# Patient Record
Sex: Female | Born: 1950 | Race: White | Hispanic: No | Marital: Married | Smoking: Never smoker
Health system: Southern US, Community
[De-identification: ages and names within clinical notes are randomized; demographics above are authoritative.]

## PROBLEM LIST (undated history)

## (undated) DIAGNOSIS — R351 Nocturia: Secondary | ICD-10-CM

## (undated) DIAGNOSIS — T8859XA Other complications of anesthesia, initial encounter: Secondary | ICD-10-CM

## (undated) DIAGNOSIS — I10 Essential (primary) hypertension: Secondary | ICD-10-CM

## (undated) DIAGNOSIS — K219 Gastro-esophageal reflux disease without esophagitis: Secondary | ICD-10-CM

## (undated) DIAGNOSIS — T4145XA Adverse effect of unspecified anesthetic, initial encounter: Secondary | ICD-10-CM

## (undated) DIAGNOSIS — M1990 Unspecified osteoarthritis, unspecified site: Secondary | ICD-10-CM

## (undated) DIAGNOSIS — L409 Psoriasis, unspecified: Secondary | ICD-10-CM

## (undated) DIAGNOSIS — H269 Unspecified cataract: Secondary | ICD-10-CM

## (undated) DIAGNOSIS — R112 Nausea with vomiting, unspecified: Secondary | ICD-10-CM

## (undated) DIAGNOSIS — Z8679 Personal history of other diseases of the circulatory system: Secondary | ICD-10-CM

## (undated) DIAGNOSIS — Z8719 Personal history of other diseases of the digestive system: Secondary | ICD-10-CM

## (undated) DIAGNOSIS — M419 Scoliosis, unspecified: Secondary | ICD-10-CM

## (undated) DIAGNOSIS — M545 Low back pain, unspecified: Secondary | ICD-10-CM

## (undated) DIAGNOSIS — Z780 Asymptomatic menopausal state: Secondary | ICD-10-CM

## (undated) DIAGNOSIS — F329 Major depressive disorder, single episode, unspecified: Secondary | ICD-10-CM

## (undated) DIAGNOSIS — F32A Depression, unspecified: Secondary | ICD-10-CM

## (undated) DIAGNOSIS — Z9889 Other specified postprocedural states: Secondary | ICD-10-CM

## (undated) DIAGNOSIS — E785 Hyperlipidemia, unspecified: Secondary | ICD-10-CM

## (undated) HISTORY — DX: Essential (primary) hypertension: I10

## (undated) HISTORY — DX: Low back pain: M54.5

## (undated) HISTORY — DX: Hyperlipidemia, unspecified: E78.5

## (undated) HISTORY — DX: Unspecified osteoarthritis, unspecified site: M19.90

## (undated) HISTORY — DX: Low back pain, unspecified: M54.50

## (undated) HISTORY — DX: Depression, unspecified: F32.A

## (undated) HISTORY — PX: OTHER SURGICAL HISTORY: SHX169

## (undated) HISTORY — DX: Gastro-esophageal reflux disease without esophagitis: K21.9

## (undated) HISTORY — DX: Major depressive disorder, single episode, unspecified: F32.9

## (undated) HISTORY — PX: CARPAL TUNNEL RELEASE: SHX101

## (undated) HISTORY — DX: Scoliosis, unspecified: M41.9

## (undated) HISTORY — DX: Psoriasis, unspecified: L40.9

## (undated) HISTORY — PX: ANKLE SURGERY: SHX546

## (undated) HISTORY — DX: Asymptomatic menopausal state: Z78.0

---
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
## 1998-11-13 ENCOUNTER — Ambulatory Visit (HOSPITAL_COMMUNITY): Admission: RE | Admit: 1998-11-13 | Discharge: 1998-11-13 | Payer: Self-pay | Admitting: Internal Medicine

## 1998-11-13 ENCOUNTER — Encounter: Payer: Self-pay | Admitting: Internal Medicine

## 2000-06-29 ENCOUNTER — Encounter: Admission: RE | Admit: 2000-06-29 | Discharge: 2000-06-29 | Payer: Self-pay | Admitting: Obstetrics and Gynecology

## 2000-06-29 ENCOUNTER — Encounter: Payer: Self-pay | Admitting: Obstetrics and Gynecology

## 2001-04-27 ENCOUNTER — Encounter: Payer: Self-pay | Admitting: Dermatology

## 2001-04-27 ENCOUNTER — Ambulatory Visit (HOSPITAL_COMMUNITY): Admission: RE | Admit: 2001-04-27 | Discharge: 2001-04-27 | Payer: Self-pay | Admitting: Dermatology

## 2001-04-27 ENCOUNTER — Encounter (INDEPENDENT_AMBULATORY_CARE_PROVIDER_SITE_OTHER): Payer: Self-pay | Admitting: Specialist

## 2001-06-14 ENCOUNTER — Other Ambulatory Visit: Admission: RE | Admit: 2001-06-14 | Discharge: 2001-06-14 | Payer: Self-pay | Admitting: Obstetrics and Gynecology

## 2001-07-15 ENCOUNTER — Encounter: Admission: RE | Admit: 2001-07-15 | Discharge: 2001-07-15 | Payer: Self-pay | Admitting: Obstetrics and Gynecology

## 2001-07-15 ENCOUNTER — Encounter: Payer: Self-pay | Admitting: Obstetrics and Gynecology

## 2002-06-30 ENCOUNTER — Other Ambulatory Visit: Admission: RE | Admit: 2002-06-30 | Discharge: 2002-06-30 | Payer: Self-pay | Admitting: Obstetrics and Gynecology

## 2002-09-19 ENCOUNTER — Encounter: Payer: Self-pay | Admitting: Obstetrics and Gynecology

## 2002-09-19 ENCOUNTER — Encounter: Admission: RE | Admit: 2002-09-19 | Discharge: 2002-09-19 | Payer: Self-pay | Admitting: Obstetrics and Gynecology

## 2003-11-29 ENCOUNTER — Encounter: Admission: RE | Admit: 2003-11-29 | Discharge: 2003-11-29 | Payer: Self-pay | Admitting: Internal Medicine

## 2004-05-29 ENCOUNTER — Ambulatory Visit (HOSPITAL_COMMUNITY): Admission: RE | Admit: 2004-05-29 | Discharge: 2004-05-29 | Payer: Self-pay | Admitting: Dermatology

## 2004-05-29 ENCOUNTER — Encounter (INDEPENDENT_AMBULATORY_CARE_PROVIDER_SITE_OTHER): Payer: Self-pay | Admitting: Specialist

## 2005-02-06 ENCOUNTER — Ambulatory Visit (HOSPITAL_COMMUNITY): Admission: RE | Admit: 2005-02-06 | Discharge: 2005-02-06 | Payer: Self-pay | Admitting: Obstetrics and Gynecology

## 2005-03-17 ENCOUNTER — Other Ambulatory Visit: Admission: RE | Admit: 2005-03-17 | Discharge: 2005-03-17 | Payer: Self-pay | Admitting: Obstetrics and Gynecology

## 2006-04-13 ENCOUNTER — Ambulatory Visit (HOSPITAL_COMMUNITY): Admission: RE | Admit: 2006-04-13 | Discharge: 2006-04-13 | Payer: Self-pay | Admitting: Obstetrics and Gynecology

## 2006-06-10 ENCOUNTER — Ambulatory Visit (HOSPITAL_BASED_OUTPATIENT_CLINIC_OR_DEPARTMENT_OTHER): Admission: RE | Admit: 2006-06-10 | Discharge: 2006-06-10 | Payer: Self-pay | Admitting: Orthopedic Surgery

## 2006-07-20 ENCOUNTER — Other Ambulatory Visit: Admission: RE | Admit: 2006-07-20 | Discharge: 2006-07-20 | Payer: Self-pay | Admitting: Obstetrics and Gynecology

## 2007-04-27 ENCOUNTER — Ambulatory Visit (HOSPITAL_COMMUNITY): Admission: RE | Admit: 2007-04-27 | Discharge: 2007-04-27 | Payer: Self-pay | Admitting: Obstetrics and Gynecology

## 2007-05-04 ENCOUNTER — Encounter: Admission: RE | Admit: 2007-05-04 | Discharge: 2007-05-04 | Payer: Self-pay | Admitting: Obstetrics and Gynecology

## 2007-09-29 HISTORY — PX: DILATION AND CURETTAGE OF UTERUS: SHX78

## 2007-10-12 ENCOUNTER — Other Ambulatory Visit: Admission: RE | Admit: 2007-10-12 | Discharge: 2007-10-12 | Payer: Self-pay | Admitting: Obstetrics and Gynecology

## 2008-05-22 ENCOUNTER — Ambulatory Visit (HOSPITAL_COMMUNITY): Admission: RE | Admit: 2008-05-22 | Discharge: 2008-05-22 | Payer: Self-pay | Admitting: Obstetrics and Gynecology

## 2008-06-28 ENCOUNTER — Ambulatory Visit (HOSPITAL_BASED_OUTPATIENT_CLINIC_OR_DEPARTMENT_OTHER): Admission: RE | Admit: 2008-06-28 | Discharge: 2008-06-28 | Payer: Self-pay | Admitting: Orthopedic Surgery

## 2008-10-24 ENCOUNTER — Other Ambulatory Visit: Admission: RE | Admit: 2008-10-24 | Discharge: 2008-10-24 | Payer: Self-pay | Admitting: Obstetrics and Gynecology

## 2008-11-12 ENCOUNTER — Encounter: Payer: Self-pay | Admitting: Obstetrics and Gynecology

## 2008-11-12 ENCOUNTER — Ambulatory Visit (HOSPITAL_COMMUNITY): Admission: RE | Admit: 2008-11-12 | Discharge: 2008-11-12 | Payer: Self-pay | Admitting: Obstetrics and Gynecology

## 2009-02-05 ENCOUNTER — Encounter: Admission: RE | Admit: 2009-02-05 | Discharge: 2009-02-05 | Payer: Self-pay | Admitting: Internal Medicine

## 2009-06-10 ENCOUNTER — Ambulatory Visit (HOSPITAL_COMMUNITY): Admission: RE | Admit: 2009-06-10 | Discharge: 2009-06-10 | Payer: Self-pay | Admitting: Obstetrics and Gynecology

## 2009-08-08 ENCOUNTER — Ambulatory Visit (HOSPITAL_BASED_OUTPATIENT_CLINIC_OR_DEPARTMENT_OTHER): Admission: RE | Admit: 2009-08-08 | Discharge: 2009-08-08 | Payer: Self-pay | Admitting: Orthopedic Surgery

## 2010-03-20 ENCOUNTER — Ambulatory Visit (HOSPITAL_COMMUNITY): Admission: RE | Admit: 2010-03-20 | Discharge: 2010-03-20 | Payer: Self-pay | Admitting: Anesthesiology

## 2010-07-31 ENCOUNTER — Ambulatory Visit (HOSPITAL_COMMUNITY): Admission: RE | Admit: 2010-07-31 | Discharge: 2010-07-31 | Payer: Self-pay | Admitting: Obstetrics and Gynecology

## 2010-10-19 ENCOUNTER — Encounter: Payer: Self-pay | Admitting: Obstetrics and Gynecology

## 2010-10-20 ENCOUNTER — Encounter: Payer: Self-pay | Admitting: Internal Medicine

## 2010-12-30 ENCOUNTER — Ambulatory Visit (INDEPENDENT_AMBULATORY_CARE_PROVIDER_SITE_OTHER): Payer: 59 | Admitting: Internal Medicine

## 2010-12-30 DIAGNOSIS — L578 Other skin changes due to chronic exposure to nonionizing radiation: Secondary | ICD-10-CM

## 2010-12-30 DIAGNOSIS — I10 Essential (primary) hypertension: Secondary | ICD-10-CM

## 2010-12-30 DIAGNOSIS — E785 Hyperlipidemia, unspecified: Secondary | ICD-10-CM

## 2010-12-30 DIAGNOSIS — K210 Gastro-esophageal reflux disease with esophagitis: Secondary | ICD-10-CM

## 2010-12-31 LAB — BASIC METABOLIC PANEL
BUN: 9 mg/dL (ref 6–23)
Calcium: 9.4 mg/dL (ref 8.4–10.5)
Creatinine, Ser: 0.78 mg/dL (ref 0.4–1.2)
GFR calc Af Amer: >60 mL/min (ref >60)
GFR calc non Af Amer: >60 mL/min (ref >60)
Glucose, Bld: 89 mg/dL (ref 70–99)
Sodium: 137 mEq/L (ref 135–145)

## 2010-12-31 LAB — POCT HEMOGLOBIN-HEMACUE: Hemoglobin: 13.4 g/dL (ref 12.0–15.0)

## 2011-01-13 LAB — BASIC METABOLIC PANEL
Creatinine, Ser: 0.73 mg/dL (ref 0.4–1.2)
GFR calc Af Amer: >60 mL/min (ref >60)
Glucose, Bld: 105 mg/dL — ABNORMAL HIGH (ref 70–99)
Sodium: 135 mEq/L (ref 135–145)

## 2011-01-13 LAB — CBC
MCHC: 33.6 g/dL (ref 30.0–36.0)
RBC: 4.31 MIL/uL (ref 3.87–5.11)
RDW: 12.9 % (ref 11.5–15.5)

## 2011-02-03 ENCOUNTER — Encounter: Payer: Self-pay | Admitting: Internal Medicine

## 2011-02-10 NOTE — Op Note (Signed)
Pamela Malone, CRAGIN               ACCOUNT NO.:  [id number]   MEDICAL RECORD NO.:  [id number]          PATIENT TYPE:  AMB   LOCATION:  SDC                           FACILITY:  [facility]   PHYSICIAN:  Cynthia P. Romine, M.D.DATE OF BIRTH:  [birth year]   DATE OF PROCEDURE:  DATE OF DISCHARGE:                               OPERATIVE REPORT   PREOPERATIVE DIAGNOSES:  Postmenopausal bleeding, endometrial polyp seen  on sonohysterogram.   POSTOPERATIVE DIAGNOSES:  Postmenopausal bleeding, endometrial polyp  seen on sonohysterogram plus probable small submucous fibroid.   PROCEDURES:  Hysteroscopic resection of polyps and fibroids, D and C.   SURGEON:  Cynthia P. Romine, MD   ANESTHESIA:  General by LMA.   ESTIMATED BLOOD LOSS:  Minimal.  Sorbitol deficit, 20 mL.   COMPLICATIONS:  None.   PROCEDURE:  The patient was taken to the operating room and after  induction of adequate general anesthesia, was placed in dorsal lithotomy  position and prepped and draped in the usual fashion.  The bladder was  drained with a red rubber catheter.  Posterior weighted and anterior  Sims retractor were placed.  The cervix was grasped on its anterior lip  with a single-toothed tenaculum.  A paracervical block was instituted by  injecting 10 mL of 1% lidocaine at each of 3 and 9 o'clock.  The uterus  sounded to 8 cm.  The cervix was dilated to #31 Shawnie Pons.  The resectoscope  was then introduced.  Sorbitol was used as a distention medium.  The  sorbitol pressure pump was set at 70 mmHg.  Several endometrial polyps  were noted fundally, also there seemed to be a small submucous myoma.  These were resected using a single loop resectoscope removing them in  several pieces until the cavity appeared clear.  Photographic  documentation was taken both before beginning the resection and after.  The specimen that was felt to be a possible submucous fibroid was sent  separately.  The polyps and the curettings were sent  together to  pathology.  Once the cavity appeared clean, the scope was withdrawn.  Gentle curettage was done, then the infant removed from the vagina and  the procedure was terminated.  The patient tolerated it well, went in  satisfactory condition to post anesthesia recovery.      Cynthia P. Romine, M.D.  Electronically Signed     CPR/MEDQ  D:  11/12/2008  T:  11/13/2008  Job:  [id number]

## 2011-02-10 NOTE — Op Note (Signed)
Pamela Malone, Pamela Malone               ACCOUNT NO.:  [id number]   MEDICAL RECORD NO.:  [id number]          PATIENT TYPE:  AMB   LOCATION:  DSC                          FACILITY:  [facility]   PHYSICIAN:  Katy Fitch. Sypher, M.D. DATE OF BIRTH:  [birth year]   DATE OF PROCEDURE:  06/28/2008  DATE OF DISCHARGE:                               OPERATIVE REPORT   PREOPERATIVE DIAGNOSES:  1. Entrapped neuropathy, median nerve, left carpal tunnel.  2. Severe degenerative arthritis, left thumb carpometacarpal joint.   POSTOPERATIVE DIAGNOSES:  1. Entrapped neuropathy, median nerve, left carpal tunnel.  2. Severe degenerative arthritis, left thumb carpometacarpal joint.   OPERATION:  1. Release of left transcarpal ligament.  2. Injection of left thumb carpometacarpal joint with Depo-Medrol 40      mg and lidocaine 1 mL of 2% plain lidocaine.   OPERATING SURGEON:  Katy Fitch. Sypher, MD   ASSISTANT:  Marveen Reeks Dasnoit, PA-C   ANESTHESIA:  General by LMA.   SUPERVISING ANESTHESIOLOGIST:  Janetta Hora. Gelene Mink, MD   INDICATIONS:  Pamela Malone is a 60 year old woman who has been followed  for thumb arthritis and bilateral carpal tunnel syndrome.  She is status  post release of right transcarpal ligament.  We had discussed possible  injection of her left thumb CMC joint to ameliorate pain due to [place]  arthrosis.  She is brought to the operating room at this time  anticipating release of her left transcarpal ligament.  While under  anesthesia, we will also inject her left thumb CMC joint with Depo-  Medrol and lidocaine.   After informed consent, she was brought to the operating room at this  time.  Preoperatively, she was interviewed by Dr. Gelene Mink from  Anesthesia.  General anesthesia by LMA technique was recommended and  accepted by Pamela Malone.   PROCEDURE:  Janaia Malone was brought to the operating room and placed  in supine position upon the operating table.   Following the induction of  general anesthesia by LMA technique, the left  arm was prepped with Betadine soap solution, and sterilely draped.  A  pneumatic tourniquet was applied to the proximal brachium.   On exsanguination of the left arm with Esmarch bandage, the arterial  tourniquet was inflated to 220 mmHg.  The procedure commenced with  careful palpation of the landmarks of the [facility] joint.  A 10 pounds  traction was applied to the thumb.  The [facility] joint line was identified  and a 25-gauge needle gently placed within the capsule.  After  aspiration, the joint was infiltrated with a mixture of 1.5 mL of a  50:50 mixture of Depo-Medrol 40 mg per mL and 2% lidocaine.  Total dose  of Depo-Medrol was probably in the range of 30 mg.   Attention was then directed to the mid palm.  A short incision was  fashioned in the line of the ring finger.  Subcutaneous tissues were  carefully divided via the palmar fascia.  This split longitudinally to  the common sensory branch of the median nerve.  These were followed back  to the transcarpal ligament,  which was gently isolated from the median  nerve utilizing a Insurance risk surveyor.  The ulnar aspect of the  transverse carpal ligament was released with scissors extending across  the carpal canal into the distal forearm.  This widely opened the carpal  canal.  The ulnar bursa was noted to be fibrotic and hypertrophic.   The wound was then inspect to bleeding points and repaired with  intradermal 3-0 Prolene suture.  A compressive dressing was applied with  a volar plaster splint maintaining the wrist in 5 degrees of  dorsiflexion.   There were no apparent complications.  Ms. Brousseau tolerated the  surgery and anesthesia well.  She was transferred to the recovery room  with stable signs.      Katy Fitch Sypher, M.D.  Electronically Signed     RVS/MEDQ  D:  06/28/2008  T:  06/29/2008  Job:  [id number]

## 2011-02-13 NOTE — Op Note (Signed)
Pamela Malone, Pamela Malone               ACCOUNT NO.:  [id number]   MEDICAL RECORD NO.:  [id number]          PATIENT TYPE:  AMB   LOCATION:  DSC                          FACILITY:  [facility]   PHYSICIAN:  Katy Fitch. Sypher, M.D. DATE OF BIRTH:  10-18-1950   DATE OF PROCEDURE:  06/10/2006  DATE OF DISCHARGE:                                 OPERATIVE REPORT   PREOPERATIVE DIAGNOSES:  1. Bilateral carpal tunnel syndrome, right greater than left.  2. Left thumb carpometacarpal arthrosis.   POSTOPERATIVE DIAGNOSES:  1. Bilateral carpal tunnel syndrome, right greater than left.  2. Left thumb carpometacarpal arthrosis.   OPERATIONS:  1. Release of right transverse carpal ligament.  2. Injection of left ulnar bursa.  3. Injection of left thumb CMC joint under general anesthesia.   OPERATING SURGEON:  Katy Fitch. Sypher, M.D.   ASSISTANT:  Molly Maduro Dasnoit PA-C.   ANESTHESIA:  General by LMA.   SUPERVISING ANESTHESIOLOGIST:  Dr. Jairo Ben.   INDICATIONS:  Pamela Malone is a 60 year old  woman who was referred for  evaluation bilateral hand pain and numbness.  Clinical examination revealed  signs of significant left thumb CMC arthrosis and bilateral carpal tunnel  syndrome.   On May 12, 2006, Dr. Wadie Lessen performed detailed electrodiagnostic  studies revealing a nonrecordable motor latency on the right and moderately  severe left carpal tunnel syndrome.  Pamela Malone was noted to have  significant carpometacarpal arthritis both on clinical examination and  radiographic films.  At the time of her informed consent, anticipating right  carpal tunnel release surgery, we offered injection of her left ulnar bursa  and left thumb CMC joint.  She preferred to have her injection performed  while under general anesthesia.   After informed consent, she is brought to the operating room at this time.   PROCEDURE:  Deshayla Empson is brought to the operating room and placed in the  supine  position upon the operating table.   Following the induction of general anesthesia by LMA technique, the right  arm was prepped Betadine soap solution and sterilely draped.  A pneumatic  tourniquet was applied to the proximal right brachium.  On exsanguination of  the right arm with an Esmarch bandage, the arterial tourniquet was inflated  to 220 mmHg.  The procedure commenced with a short incision in the line of  the ring finger and the palm.  Subcutaneous tissues were carefully divided  and revealed the palmar fascia.  This was split longitudinally through the  common sensory branch of the median nerve and the superficial palmar arch.  The distal margin of the transverse carpal ligament was defined and the  Penfield 4 elevator used to define a plane between the median nerve proper  and the deep surface of the transverse carpal ligament.   Scissor were used along the ulnar border of the ligament to release the  ligament subcutaneously extending into the distal forearm.  The volar carpal  ligament was identified and also released.   The wound was inspected for bleeding points.  Wound was subsequently  repaired with intradermal 3-0 Prolene  and Steri-Strips.   Attention was then directed to the left hand.   With the fingers in flexed position, I injected a mixture of 1.5 mL of 1%  plain lidocaine and 0.5 mL of Depo-Medrol 40 mg/mL into the ulnar bursa.  At  time of injection, we extended the fingers to avoid any possible needle  entry into the flexor tendons.   The thumb CMC joint was likewise injected with mixture of 0.5 mL of Depo-  Medrol 40 mg/mL and 0.5 mL of 2% plain lidocaine.  The landmarks were  palpated and after alcohol and Betadine prep, the CMC joint was placed under  distraction and injected with the mixture for a total of 1 mL of the 50/50  mixture.   There were no apparent complications.   Pamela Malone tolerated surgery and anesthesia well.  Her right hand was   addressed with a sterile gauze, sterile Webril, a volar plaster splint and  Ace wrap.  A Band-Aid was applied to the left hand at the injection site.      Katy Fitch Sypher, M.D.  Electronically Signed     RVS/MEDQ  D:  06/10/2006  T:  06/11/2006  Job:  [id number]

## 2011-06-02 ENCOUNTER — Ambulatory Visit: Payer: 59 | Admitting: Internal Medicine

## 2011-06-08 ENCOUNTER — Ambulatory Visit: Payer: 59 | Admitting: Internal Medicine

## 2011-06-15 ENCOUNTER — Ambulatory Visit (INDEPENDENT_AMBULATORY_CARE_PROVIDER_SITE_OTHER): Payer: 59 | Admitting: Internal Medicine

## 2011-06-15 ENCOUNTER — Encounter: Payer: Self-pay | Admitting: Internal Medicine

## 2011-06-15 VITALS — BP 120/78 | HR 75 | Temp 97.2°F | Ht 60.0 in | Wt 176.0 lb

## 2011-06-15 DIAGNOSIS — I10 Essential (primary) hypertension: Secondary | ICD-10-CM | POA: Insufficient documentation

## 2011-06-15 DIAGNOSIS — M1990 Unspecified osteoarthritis, unspecified site: Secondary | ICD-10-CM

## 2011-06-15 DIAGNOSIS — F329 Major depressive disorder, single episode, unspecified: Secondary | ICD-10-CM | POA: Insufficient documentation

## 2011-06-15 DIAGNOSIS — M419 Scoliosis, unspecified: Secondary | ICD-10-CM | POA: Insufficient documentation

## 2011-06-15 DIAGNOSIS — K219 Gastro-esophageal reflux disease without esophagitis: Secondary | ICD-10-CM

## 2011-06-15 DIAGNOSIS — F32A Depression, unspecified: Secondary | ICD-10-CM | POA: Insufficient documentation

## 2011-06-15 DIAGNOSIS — M545 Low back pain: Secondary | ICD-10-CM | POA: Insufficient documentation

## 2011-06-15 DIAGNOSIS — Z780 Asymptomatic menopausal state: Secondary | ICD-10-CM | POA: Insufficient documentation

## 2011-06-15 DIAGNOSIS — E785 Hyperlipidemia, unspecified: Secondary | ICD-10-CM

## 2011-06-15 DIAGNOSIS — L409 Psoriasis, unspecified: Secondary | ICD-10-CM | POA: Insufficient documentation

## 2011-06-15 NOTE — Patient Instructions (Signed)
Schedule CPE in 6 months  OK to come off Welchol for now  Recheck fasting lipids at CPe

## 2011-06-15 NOTE — Progress Notes (Signed)
Subjective:    Patient ID: Pamela Malone, female    DOB: [birth year], 60 y.o.   MRN: [id number]  HPI  Pamela Malone is doing well.  Happy feeling good.  Enjoys spending time with grandsons  BP has been great.  Wants to know if she can come off Welchol.  Was put on this by PA at [facility]  See last lipid in paper chart  Meloxicam helping DJD esp with back.  No GI distress       No Known Allergies Past Medical History  Diagnosis Date  . Scoliosis   . Depression   . Hypertension   . Hyperlipidemia   . DJD (degenerative joint disease)   . Menopause   . Psoriasis   . GERD (gastroesophageal reflux disease)   . Low back pain    Past Surgical History  Procedure Date  . Carpal tunnel release bilateral  . Dilation and curettage of uterus 2009   History   Social History  . Marital Status: Married    Spouse Name: N/A    Number of Children: 2  . Years of Education: HS grad   Occupational History  . Retired    Social History Main Topics  . Smoking status: Never Smoker   . Smokeless tobacco: Never Used  . Alcohol Use: 2.0 oz/week    4 drink(s) per week  . Drug Use: No  . Sexually Active: Yes   Other Topics Concern  . Not on file   Social History Narrative  . No narrative on file   Family History  Problem Relation Age of Onset  . Hypertension Mother   . Stroke Mother   . Diabetes Paternal Grandmother   . Alzheimer's disease Mother   . Parkinsonism Paternal Grandmother    There is no problem list on file for this patient.  Current Outpatient Prescriptions on File Prior to Visit  Medication Sig Dispense Refill  . cetirizine (ZYRTEC) 10 MG tablet Take 10 mg by mouth daily.        . citalopram (CELEXA) 40 MG tablet Take 40 mg by mouth daily.        . colesevelam (WELCHOL) 625 MG tablet Take 3,750 mg by mouth daily.        Marland Kitchen omeprazole (PRILOSEC) 20 MG capsule Take 20 mg by mouth daily.        . simvastatin (ZOCOR) 20 MG tablet Take 20 mg by mouth at bedtime.            Review of Systems No chest pain no SOB, No LE edema NO change in appetiite or change in color of stool    Objective:   Physical Exam  Physical Exam  Nursing note and vitals reviewed.  Constitutional: She is oriented to person, place, and time. She appears well-developed and well-nourished.  HENT:  Head: Normocephalic and atraumatic.  Cardiovascular: Normal rate and regular rhythm. Exam reveals no gallop and no friction rub.  No murmur heard.  Pulmonary/Chest: Breath sounds normal. She has no wheezes. She has no rales.  Neurological: She is alert and oriented to person, place, and time.  Skin: Skin is warm and dry.  Psychiatric: She has a normal mood and affect. Her behavior is normal. LE no edema            Assessment & Plan:  1)  Hyperlipidemai  OK to come off Welchol for now.  Will Check TG's in 6 months at CPE 2)  HTn  Well controlled 3)  Low back pain and DJD.  Managed well on Meloxicam  Take Omeprazole for GI protection.   Cautioned regardinfg GI dyspe[sia or stomach bleeding .Marland Kitchen  She is to stop med and report any symptoms 4)  GERD on omeprazole

## 2011-06-29 ENCOUNTER — Other Ambulatory Visit: Payer: Self-pay | Admitting: Internal Medicine

## 2011-06-29 DIAGNOSIS — Z1231 Encounter for screening mammogram for malignant neoplasm of breast: Secondary | ICD-10-CM

## 2011-06-29 LAB — BASIC METABOLIC PANEL
BUN: 14
Calcium: 9.4
Chloride: 100
Creatinine, Ser: 0.82
GFR calc non Af Amer: >60
Potassium: 3.8

## 2011-06-29 LAB — POCT HEMOGLOBIN-HEMACUE: Hemoglobin: 15.2 — ABNORMAL HIGH

## 2011-08-03 ENCOUNTER — Ambulatory Visit (HOSPITAL_COMMUNITY)
Admission: RE | Admit: 2011-08-03 | Discharge: 2011-08-03 | Disposition: A | Discharge status: Home / Self Care | Payer: 59 | Source: Ambulatory Visit | Attending: Internal Medicine | Admitting: Internal Medicine

## 2011-08-03 DIAGNOSIS — Z1231 Encounter for screening mammogram for malignant neoplasm of breast: Secondary | ICD-10-CM | POA: Insufficient documentation

## 2011-08-24 ENCOUNTER — Other Ambulatory Visit: Payer: Self-pay | Admitting: Internal Medicine

## 2011-08-24 DIAGNOSIS — K219 Gastro-esophageal reflux disease without esophagitis: Secondary | ICD-10-CM

## 2011-08-24 MED ORDER — OMEPRAZOLE 20 MG PO CPDR: 20.0000 mg | DELAYED_RELEASE_CAPSULE | Freq: Every day | ORAL | Status: DC

## 2011-08-24 NOTE — Telephone Encounter (Signed)
Spoke with Pamela Malone, she requests 90 days to [facility] order (not [facility] [facility])

## 2011-08-24 NOTE — Telephone Encounter (Signed)
Pt's husband called in a prescription refill for his wife. It's Omeprazole (Capsule Delayed Release) PRILOSEC 20 MG Take 20 mg by mouth daily.  He states they get tee medicine at  [facility]. If you have any questions please call [phone].

## 2011-08-28 ENCOUNTER — Other Ambulatory Visit: Payer: Self-pay | Admitting: Emergency Medicine

## 2011-08-28 DIAGNOSIS — E785 Hyperlipidemia, unspecified: Secondary | ICD-10-CM

## 2011-08-28 NOTE — Telephone Encounter (Signed)
Refill request received from [facility] for Pamela Malone's simvastatin 90 day supply

## 2011-08-31 MED ORDER — SIMVASTATIN 20 MG PO TABS: 20.0000 mg | ORAL_TABLET | Freq: Every day | ORAL | Status: DC

## 2011-09-23 ENCOUNTER — Other Ambulatory Visit: Payer: Self-pay | Admitting: Internal Medicine

## 2011-09-23 MED ORDER — CITALOPRAM HYDROBROMIDE 40 MG PO TABS: 40.0000 mg | ORAL_TABLET | Freq: Every day | ORAL | Status: DC

## 2011-09-23 NOTE — Telephone Encounter (Signed)
Pt called needing a refill on Citalopram Hydrobromide (Tab) CELEXA 40 MG Take 40 mg by mouth daily.  [facility] is her pharmacy and they need a renewal on the medication. She usually gets a 3 month supply. If any questions feel free to call her at [phone]. Thanks

## 2011-09-24 ENCOUNTER — Ambulatory Visit (INDEPENDENT_AMBULATORY_CARE_PROVIDER_SITE_OTHER): Payer: 59 | Admitting: Internal Medicine

## 2011-09-24 ENCOUNTER — Telehealth: Payer: Self-pay | Admitting: Internal Medicine

## 2011-09-24 VITALS — BP 137/86 | HR 80 | Temp 97.2°F | Resp 16 | Ht 60.0 in | Wt 180.1 lb

## 2011-09-24 DIAGNOSIS — R05 Cough: Secondary | ICD-10-CM

## 2011-09-24 DIAGNOSIS — J40 Bronchitis, not specified as acute or chronic: Secondary | ICD-10-CM

## 2011-09-24 MED ORDER — AZITHROMYCIN 250 MG PO TABS: ORAL_TABLET | ORAL | Status: AC

## 2011-09-24 MED ORDER — HYDROCODONE-HOMATROPINE 5-1.5 MG/5ML PO SYRP: 5.0000 mL | ORAL_SOLUTION | Freq: Three times a day (TID) | ORAL | Status: AC | PRN

## 2011-09-24 NOTE — Telephone Encounter (Signed)
Spoke with Pamela Malone.  She states that she has had a cough for about 7 days.  It is non productive, but it is "rattly" in her chest.  She states she has some sinus pressure, but no cold like symptoms.  No fevers.  She is concerned for infectious process, requests zpak.  Aware DDS will not call in abx without being seen.  Scheduled appt this afternoon at 4pm

## 2011-09-24 NOTE — Telephone Encounter (Signed)
PT WOULD LIKE TO BE SEEN TODAY IF POSSIBLE OR SOMETHING CALL IN FOR HER CRAZY COUGH. PLEASE CALL HER AT [phone]. THANKS

## 2011-09-25 ENCOUNTER — Encounter: Payer: Self-pay | Admitting: Internal Medicine

## 2011-09-25 NOTE — Progress Notes (Signed)
Subjective:    Patient ID: Pamela Malone, female    DOB: 08/20/1951, 60 y.o.   MRN: [id number]  HPI  Khylah is here for an acute visit.  She has had 7 days of URI symtpoms   Nasal congestion, cough occasionally productive and sinus pressure.  She has no fever but did not receive and influenza vaccine.  She is concerned she has a sinus infection.  No SOB or chest pain No Known Allergies Past Medical History  Diagnosis Date  . Scoliosis   . Menopause   . Psoriasis   . Low back pain   . DJD (degenerative joint disease)   . Depression   . GERD (gastroesophageal reflux disease)   . Hyperlipidemia   . Hypertension    Past Surgical History  Procedure Date  . Carpal tunnel release bilateral  . Dilation and curettage of uterus 2009   History   Social History  . Marital Status: Married    Spouse Name: N/A    Number of Children: 2  . Years of Education: HS grad   Occupational History  . Retired    Social History Main Topics  . Smoking status: Never Smoker   . Smokeless tobacco: Never Used  . Alcohol Use: 2.0 oz/week    4 drink(s) per week  . Drug Use: No  . Sexually Active: Yes   Other Topics Concern  . Not on file   Social History Narrative  . No narrative on file   Family History  Problem Relation Age of Onset  . Hypertension Mother   . Stroke Mother   . Diabetes Paternal Grandmother   . Alzheimer's disease Mother   . Parkinsonism Paternal Grandmother    Patient Active Problem List  Diagnoses  . DJD (degenerative joint disease)  . Scoliosis  . Low back pain  . Depression  . GERD (gastroesophageal reflux disease)  . Hyperlipidemia  . Hypertension  . Menopause  . Psoriasis   Current Outpatient Prescriptions on File Prior to Visit  Medication Sig Dispense Refill  . cetirizine (ZYRTEC) 10 MG tablet Take 10 mg by mouth daily.        . citalopram (CELEXA) 40 MG tablet Take 1 tablet (40 mg total) by mouth daily.  90 tablet  1  . colesevelam (WELCHOL) 625 MG  tablet Take 3,750 mg by mouth daily.        . meloxicam (MOBIC) 15 MG tablet Take 1 tablet by mouth Daily.      . Olmesartan-Amlodipine-HCTZ (TRIBENZOR) 40-5-25 MG TABS Take 1 tablet by mouth daily.        Marland Kitchen omeprazole (PRILOSEC) 20 MG capsule Take 1 capsule (20 mg total) by mouth daily.  90 capsule  1  . simvastatin (ZOCOR) 20 MG tablet Take 1 tablet (20 mg total) by mouth at bedtime.  90 tablet  1       Review of Systems See HPI    Objective:   Physical Exam No Known Allergies Past Medical History  Diagnosis Date  . Scoliosis   . Menopause   . Psoriasis   . Low back pain   . DJD (degenerative joint disease)   . Depression   . GERD (gastroesophageal reflux disease)   . Hyperlipidemia   . Hypertension    Past Surgical History  Procedure Date  . Carpal tunnel release bilateral  . Dilation and curettage of uterus 2009   History   Social History  . Marital Status: Married    Spouse  Name: N/A    Number of Children: 2  . Years of Education: HS grad   Occupational History  . Retired    Social History Main Topics  . Smoking status: Never Smoker   . Smokeless tobacco: Never Used  . Alcohol Use: 2.0 oz/week    4 drink(s) per week  . Drug Use: No  . Sexually Active: Yes   Other Topics Concern  . Not on file   Social History Narrative  . No narrative on file   Family History  Problem Relation Age of Onset  . Hypertension Mother   . Stroke Mother   . Diabetes Paternal Grandmother   . Alzheimer's disease Mother   . Parkinsonism Paternal Grandmother    Patient Active Problem List  Diagnoses  . DJD (degenerative joint disease)  . Scoliosis  . Low back pain  . Depression  . GERD (gastroesophageal reflux disease)  . Hyperlipidemia  . Hypertension  . Menopause  . Psoriasis   Current Outpatient Prescriptions on File Prior to Visit  Medication Sig Dispense Refill  . cetirizine (ZYRTEC) 10 MG tablet Take 10 mg by mouth daily.        . citalopram (CELEXA) 40  MG tablet Take 1 tablet (40 mg total) by mouth daily.  90 tablet  1  . colesevelam (WELCHOL) 625 MG tablet Take 3,750 mg by mouth daily.        . meloxicam (MOBIC) 15 MG tablet Take 1 tablet by mouth Daily.      . Olmesartan-Amlodipine-HCTZ (TRIBENZOR) 40-5-25 MG TABS Take 1 tablet by mouth daily.        Marland Kitchen omeprazole (PRILOSEC) 20 MG capsule Take 1 capsule (20 mg total) by mouth daily.  90 capsule  1  . simvastatin (ZOCOR) 20 MG tablet Take 1 tablet (20 mg total) by mouth at bedtime.  90 tablet  1   Physical Exam  Constitutional: She is oriented to person, place, and time. She appears well-developed and well-nourished. She is cooperative.  HENT:  Head: Normocephalic and atraumatic.  Right Ear: A middle ear effusion is present.  Left Ear: A middle ear effusion is present.  Nose: Mucosal edema present. Right sinus exhibits maxillary sinus tenderness. Left sinus exhibits maxillary sinus tenderness.  Mouth/Throat: Posterior oropharyngeal erythema present.  Serous effusion bilaterally  Eyes: Conjunctivae and EOM are normal. Pupils are equal, round, and reactive to light.  Neck: Neck supple. Carotid bruit is not present. No mass present.  Cardiovascular: Regular rhythm, normal heart sounds, intact distal pulses and normal pulses. Exam reveals no gallop and no friction rub.  No murmur heard.  Pulmonary/Chest: Breath sounds normal. She has no wheezes. She has no rhonchi. She has no rales.  Neurological: She is alert and oriented to person, place, and time.  Skin: Skin is warm and dry. No abrasion, no bruising, no ecchymosis and no rash noted. No cyanosis. Nails show no clubbing.  Psychiatric: She has a normal mood and affect. Her speech is normal and behavior is normal.             Assessment & Plan:  1)  Bronchitis Z pak 2) cough  Hydorocodone cough syrup

## 2011-09-25 NOTE — Patient Instructions (Signed)
Follow up prn

## 2011-09-30 ENCOUNTER — Other Ambulatory Visit: Payer: Self-pay | Admitting: Internal Medicine

## 2011-09-30 DIAGNOSIS — I10 Essential (primary) hypertension: Secondary | ICD-10-CM

## 2011-09-30 MED ORDER — OLMESARTAN-AMLODIPINE-HCTZ 40-5-25 MG PO TABS: 1.0000 | ORAL_TABLET | Freq: Every day | ORAL | Status: DC

## 2011-09-30 NOTE — Telephone Encounter (Signed)
Pt called stating that she need her Olmesartan-Amlodipine-HCTZ (Tab) Olmesartan-Amlodipine-HCTZ 40-5-25 MG Take 1 tablet by mouth daily.  Filled; [facility] is her pharmacy; it's a mail order; and she needs a three month supply;Thanks

## 2011-12-03 ENCOUNTER — Encounter: Payer: Self-pay | Admitting: Internal Medicine

## 2011-12-03 ENCOUNTER — Ambulatory Visit (INDEPENDENT_AMBULATORY_CARE_PROVIDER_SITE_OTHER): Payer: 59 | Admitting: Internal Medicine

## 2011-12-03 VITALS — BP 139/85 | HR 97 | Temp 97.1°F | Ht 60.0 in | Wt 178.0 lb

## 2011-12-03 DIAGNOSIS — R05 Cough: Secondary | ICD-10-CM

## 2011-12-03 DIAGNOSIS — J019 Acute sinusitis, unspecified: Secondary | ICD-10-CM

## 2011-12-03 MED ORDER — AZITHROMYCIN 250 MG PO TABS: ORAL_TABLET | ORAL | Status: AC

## 2011-12-03 MED ORDER — HYDROCOD POLST-CHLORPHEN POLST 10-8 MG/5ML PO LQCR: 5.0000 mL | Freq: Two times a day (BID) | ORAL | Status: DC

## 2011-12-03 NOTE — Patient Instructions (Signed)
Take meds as directed cal l if not better

## 2011-12-03 NOTE — Progress Notes (Signed)
Subjective:    Patient ID: Pamela Malone, female    DOB: [birth year], 61 y.o.   MRN: [id number]  HPI  Pamela Malone is here with acute visit  Several days fo nasal congestion, dry cough  Nasal discharge.  No fever , chest pain, or SOB  No Known Allergies Past Medical History  Diagnosis Date  . Scoliosis   . Menopause   . Psoriasis   . Low back pain   . DJD (degenerative joint disease)   . Depression   . GERD (gastroesophageal reflux disease)   . Hyperlipidemia   . Hypertension    Past Surgical History  Procedure Date  . Carpal tunnel release bilateral  . Dilation and curettage of uterus 2009   History   Social History  . Marital Status: Married    Spouse Name: N/A    Number of Children: 2  . Years of Education: HS grad   Occupational History  . Retired    Social History Main Topics  . Smoking status: Never Smoker   . Smokeless tobacco: Never Used  . Alcohol Use: 2.0 oz/week    4 drink(s) per week  . Drug Use: No  . Sexually Active: Yes   Other Topics Concern  . Not on file   Social History Narrative  . No narrative on file   Family History  Problem Relation Age of Onset  . Hypertension Mother   . Stroke Mother   . Diabetes Paternal Grandmother   . Alzheimer's disease Mother   . Parkinsonism Paternal Grandmother    Patient Active Problem List  Diagnoses  . DJD (degenerative joint disease)  . Scoliosis  . Low back pain  . Depression  . GERD (gastroesophageal reflux disease)  . Hyperlipidemia  . Hypertension  . Menopause  . Psoriasis   Current Outpatient Prescriptions on File Prior to Visit  Medication Sig Dispense Refill  . cetirizine (ZYRTEC) 10 MG tablet Take 10 mg by mouth daily.        . citalopram (CELEXA) 40 MG tablet Take 1 tablet (40 mg total) by mouth daily.  90 tablet  1  . colesevelam (WELCHOL) 625 MG tablet Take 3,750 mg by mouth daily.        . meloxicam (MOBIC) 15 MG tablet Take 1 tablet by mouth Daily.      . Olmesartan-Amlodipine-HCTZ  (TRIBENZOR) 40-5-25 MG TABS Take 1 tablet by mouth daily.  90 tablet  1  . omeprazole (PRILOSEC) 20 MG capsule Take 1 capsule (20 mg total) by mouth daily.  90 capsule  1  . simvastatin (ZOCOR) 20 MG tablet Take 1 tablet (20 mg total) by mouth at bedtime.  90 tablet  1      Review of Systems See hpi    Objective:   Physical Exam  Physical Exam  Constitutional: She is oriented to person, place, and time. She appears well-developed and well-nourished. She is cooperative.  HENT:  Head: Normocephalic and atraumatic.  Right Ear: A middle ear effusion is present.  Left Ear: A middle ear effusion is present.  Nose: Mucosal edema present. Right sinus exhibits maxillary sinus tenderness. Left sinus exhibits maxillary sinus tenderness.  Mouth/Throat: Posterior oropharyngeal erythema present.  Serous effusion bilaterally  Eyes: Conjunctivae and EOM are normal. Pupils are equal, round, and reactive to light.  Neck: Neck supple. Carotid bruit is not present. No mass present.  Cardiovascular: Regular rhythm, normal heart sounds, intact distal pulses and normal pulses. Exam reveals no gallop and no friction  rub.  No murmur heard.  Pulmonary/Chest: Breath sounds normal. She has no wheezes. She has no rhonchi. She has no rales.  Neurological: She is alert and oriented to person, place, and time.  Skin: Skin is warm and dry. No abrasion, no bruising, no ecchymosis and no rash noted. No cyanosis. Nails show no clubbing.  Psychiatric: She has a normal mood and affect. Her speech is normal and behavior is normal.             Assessment & Plan:  10   Sinusitis 20 cough  Z pak Tussionex Afrin for 4 days

## 2011-12-14 ENCOUNTER — Ambulatory Visit (INDEPENDENT_AMBULATORY_CARE_PROVIDER_SITE_OTHER): Payer: 59 | Admitting: Internal Medicine

## 2011-12-14 ENCOUNTER — Encounter: Payer: Self-pay | Admitting: Internal Medicine

## 2011-12-14 VITALS — BP 136/84 | HR 90 | Temp 97.7°F | Resp 20 | Ht 60.0 in | Wt 177.0 lb

## 2011-12-14 DIAGNOSIS — Z23 Encounter for immunization: Secondary | ICD-10-CM

## 2011-12-14 DIAGNOSIS — Z1151 Encounter for screening for human papillomavirus (HPV): Secondary | ICD-10-CM

## 2011-12-14 DIAGNOSIS — E785 Hyperlipidemia, unspecified: Secondary | ICD-10-CM

## 2011-12-14 DIAGNOSIS — Z780 Asymptomatic menopausal state: Secondary | ICD-10-CM

## 2011-12-14 DIAGNOSIS — Z139 Encounter for screening, unspecified: Secondary | ICD-10-CM

## 2011-12-14 DIAGNOSIS — Z01419 Encounter for gynecological examination (general) (routine) without abnormal findings: Secondary | ICD-10-CM

## 2011-12-14 DIAGNOSIS — Z124 Encounter for screening for malignant neoplasm of cervix: Secondary | ICD-10-CM

## 2011-12-14 DIAGNOSIS — N951 Menopausal and female climacteric states: Secondary | ICD-10-CM

## 2011-12-14 DIAGNOSIS — I10 Essential (primary) hypertension: Secondary | ICD-10-CM

## 2011-12-14 DIAGNOSIS — M1990 Unspecified osteoarthritis, unspecified site: Secondary | ICD-10-CM

## 2011-12-14 LAB — POCT URINALYSIS DIPSTICK
Blood, UA: NEGATIVE
Glucose, UA: NEGATIVE
Nitrite, UA: NEGATIVE
Protein, UA: NEGATIVE
Urobilinogen, UA: NEGATIVE

## 2011-12-14 NOTE — Patient Instructions (Signed)
Labs will be mailed to you  See me as needed 

## 2011-12-14 NOTE — Progress Notes (Signed)
Subjective:    Patient ID: Pamela Malone, female    DOB: [birth year], 61 y.o.   MRN: [id number]  HPI  Pamela Malone is here for comprhensive eval.  Overall doing well. Taking Zyrtec for allergies.    She consistently declines colonoscopies despite my counsel of improtance of obtaining one.  Sh is UTD on mammogram.  Not sure of Tetanus.     Review of Systems  Constitutional: Negative.   HENT: Negative.   Respiratory: Negative.   Cardiovascular: Negative.   Gastrointestinal: Negative.   Musculoskeletal: Positive for joint swelling and arthralgias.  Neurological: Negative.        Objective:   Physical Exam Physical Exam  Vital signs and nursing note reviewed  Constitutional: She is oriented to person, place, and time. She appears well-developed and well-nourished. She is cooperative.  HENT:  Head: Normocephalic and atraumatic.  Right Ear: Tympanic membrane normal.  Left Ear: Tympanic membrane normal.  Nose: Nose normal.  Mouth/Throat: Oropharynx is clear and moist and mucous membranes are normal. No oropharyngeal exudate or posterior oropharyngeal erythema.  Eyes: Conjunctivae and EOM are normal. Pupils are equal, round, and reactive to light.  Neck: Neck supple. No JVD present. Carotid bruit is not present. No mass and no thyromegaly present.  Cardiovascular: Regular rhythm, normal heart sounds, intact distal pulses and normal pulses.  Exam reveals no gallop and no friction rub.   No murmur heard. Pulses:      Dorsalis pedis pulses are 2+ on the right side, and 2+ on the left side.  Pulmonary/Chest: Breath sounds normal. She has no wheezes. She has no rhonchi. She has no rales. Right breast exhibits no mass, no nipple discharge and no skin change. Left breast exhibits no mass, no nipple discharge and no skin change.  Abdominal: Soft. Bowel sounds are normal. She exhibits no distension and no mass. There is no hepatosplenomegaly. There is no tenderness. There is no CVA tenderness.    Genitourinary: Rectum normal, vagina normal and uterus normal. Rectal exam shows no mass. Guaiac negative stool. No labial fusion. There is no lesion on the right labia. There is no lesion on the left labia. Cervix exhibits no motion tenderness. Right adnexum displays no mass, no tenderness and no fullness. Left adnexum displays no mass, no tenderness and no fullness. No erythema around the vagina.  Musculoskeletal:       No active synovitis to any joint.    Lymphadenopathy:       Right cervical: No superficial cervical adenopathy present.      Left cervical: No superficial cervical adenopathy present.       Right axillary: No pectoral and no lateral adenopathy present.       Left axillary: No pectoral and no lateral adenopathy present.      Right: No inguinal adenopathy present.       Left: No inguinal adenopathy present.  Neurological: She is alert and oriented to person, place, and time. She has normal strength and normal reflexes. No cranial nerve deficit or sensory deficit. She displays a negative Romberg sign. Coordination and gait normal.  Skin: Skin is warm and dry. No abrasion, no bruising, no ecchymosis and no rash noted. No cyanosis. Nails show no clubbing.  Psychiatric: She has a normal mood and affect. Her speech is normal and behavior is normal.          Assessment & Plan:  1)  Healt maintenance  See scanned sheet .  Tdap today 2)  Allergic rhintitis 3)  HTN  Well controlled 4)  Hyperlipidemia  Check today 5)  History of skin Ca  She has dermatolgist ^)DJD       Assessment & Plan:

## 2011-12-15 ENCOUNTER — Telehealth: Payer: Self-pay | Admitting: Internal Medicine

## 2011-12-15 LAB — CBC WITH DIFFERENTIAL/PLATELET
Basophils Absolute: 0 10*3/uL (ref 0.0–0.1)
Basophils Relative: 0 % (ref 0–1)
Eosinophils Absolute: 0.1 10*3/uL (ref 0.0–0.7)
Eosinophils Relative: 1 % (ref 0–5)
Lymphs Abs: 2.3 10*3/uL (ref 0.7–4.0)
MCH: 30.5 pg (ref 26.0–34.0)
MCHC: 32.7 g/dL (ref 30.0–36.0)
Neutrophils Relative %: 60 % (ref 43–77)
Platelets: 389 10*3/uL (ref 150–400)
RBC: 4.62 MIL/uL (ref 3.87–5.11)
RDW: 12.4 % (ref 11.5–15.5)

## 2011-12-15 LAB — COMPREHENSIVE METABOLIC PANEL
ALT: 19 U/L (ref 0–35)
AST: 22 U/L (ref 0–37)
Alkaline Phosphatase: 58 U/L (ref 39–117)
Creat: 0.84 mg/dL (ref 0.50–1.10)
Sodium: 136 mEq/L (ref 135–145)
Total Bilirubin: 0.6 mg/dL (ref 0.3–1.2)
Total Protein: 6.7 g/dL (ref 6.0–8.3)

## 2011-12-15 LAB — VITAMIN D 25 HYDROXY (VIT D DEFICIENCY, FRACTURES): Vit D, 25-Hydroxy: 27 ng/mL — ABNORMAL LOW (ref 30–89)

## 2011-12-15 LAB — LIPID PANEL
LDL Cholesterol: 129 mg/dL — ABNORMAL HIGH (ref 0–99)
Total CHOL/HDL Ratio: 2.8 Ratio
VLDL: 20 mg/dL (ref 0–40)

## 2011-12-15 NOTE — Telephone Encounter (Signed)
Pt needs to ask a question about an orthopedic test. Please call  [phone]

## 2011-12-15 NOTE — Telephone Encounter (Signed)
Spoke with Pamela Malone, she states that she saw Dr. Doristine Section at [facility] today. 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  She states that he wanted her to have a vitamin D3 and calcium level done.  She wanted to know if we could do those labs for her.  I explained she had both drawn yesterday.  Will forward results to Dr. Renae Fickle as soon as DDS reviews them

## 2011-12-21 NOTE — Telephone Encounter (Signed)
Labs faxed to Dr. Ollen Gross office

## 2012-01-06 ENCOUNTER — Telehealth: Payer: Self-pay | Admitting: Internal Medicine

## 2012-01-06 DIAGNOSIS — B977 Papillomavirus as the cause of diseases classified elsewhere: Secondary | ICD-10-CM

## 2012-01-06 NOTE — Telephone Encounter (Signed)
Spoke with both pt and Dr. Eustaquio Boyden.  With normal pap no need to treat HPV at this point but counseled pt of the necessity of having yearly pap smears.    Patient  voices understanding.  Pathology report mailed to pt

## 2012-01-07 ENCOUNTER — Telehealth: Payer: Self-pay | Admitting: Emergency Medicine

## 2012-01-07 NOTE — Telephone Encounter (Signed)
Mailed pap results to pt's home address per DDS

## 2012-02-22 ENCOUNTER — Other Ambulatory Visit: Payer: Self-pay | Admitting: Internal Medicine

## 2012-02-23 ENCOUNTER — Encounter: Payer: Self-pay | Admitting: Internal Medicine

## 2012-02-23 ENCOUNTER — Telehealth: Payer: Self-pay | Admitting: Internal Medicine

## 2012-02-23 ENCOUNTER — Ambulatory Visit (INDEPENDENT_AMBULATORY_CARE_PROVIDER_SITE_OTHER): Payer: 59 | Admitting: Internal Medicine

## 2012-02-23 VITALS — BP 144/96 | HR 88 | Temp 96.7°F | Resp 17 | Ht 60.0 in | Wt 183.0 lb

## 2012-02-23 DIAGNOSIS — R071 Chest pain on breathing: Secondary | ICD-10-CM

## 2012-02-23 DIAGNOSIS — R0789 Other chest pain: Secondary | ICD-10-CM

## 2012-02-23 DIAGNOSIS — B029 Zoster without complications: Secondary | ICD-10-CM | POA: Insufficient documentation

## 2012-02-23 DIAGNOSIS — E785 Hyperlipidemia, unspecified: Secondary | ICD-10-CM

## 2012-02-23 MED ORDER — VALACYCLOVIR HCL 1 G PO TABS: 1000.0000 mg | ORAL_TABLET | Freq: Two times a day (BID) | ORAL | Status: DC

## 2012-02-23 MED ORDER — SIMVASTATIN 20 MG PO TABS: 20.0000 mg | ORAL_TABLET | Freq: Every day | ORAL | Status: DC

## 2012-02-23 NOTE — Telephone Encounter (Signed)
Pt needs a refill: Simvastatin (Tab) ZOCOR 20 MG Take 1 tablet (20 mg total) by mouth at bedtime.  Please send or call [facility] 90 day supply. If you have any questions please call [phone]... Thanks

## 2012-02-23 NOTE — Progress Notes (Signed)
Subjective:    Patient ID: Pamela Malone, female    DOB: November 12, 1950, 61 y.o.   MRN: [id number]  HPI  Painful red rash started a few days ago R side of chest wall.  Itchy  Now no weeping.  Has had shilngles in th past.  No fever  She has delayed getting shingles vaccine despite my advice to do so at her [facility]  No Known Allergies Past Medical History  Diagnosis Date  . Scoliosis   . Menopause   . Psoriasis   . Low back pain   . DJD (degenerative joint disease)   . Depression   . GERD (gastroesophageal reflux disease)   . Hyperlipidemia   . Hypertension    Past Surgical History  Procedure Date  . Carpal tunnel release bilateral  . Dilation and curettage of uterus 2009   History   Social History  . Marital Status: Married    Spouse Name: N/A    Number of Children: 2  . Years of Education: HS grad   Occupational History  . Retired    Social History Main Topics  . Smoking status: Never Smoker   . Smokeless tobacco: Never Used  . Alcohol Use: 2.0 oz/week    4 drink(s) per week  . Drug Use: No  . Sexually Active: Yes   Other Topics Concern  . Not on file   Social History Narrative  . No narrative on file   Family History  Problem Relation Age of Onset  . Hypertension Mother   . Stroke Mother   . Diabetes Paternal Grandmother   . Alzheimer's disease Mother   . Parkinsonism Paternal Grandmother    Patient Active Problem List  Diagnoses  . DJD (degenerative joint disease)  . Scoliosis  . Low back pain  . Depression  . GERD (gastroesophageal reflux disease)  . Hyperlipidemia  . Hypertension  . Menopause  . Psoriasis  . HPV in female  . Shingles   Current Outpatient Prescriptions on File Prior to Visit  Medication Sig Dispense Refill  . cetirizine (ZYRTEC) 10 MG tablet Take 10 mg by mouth daily.        . citalopram (CELEXA) 40 MG tablet Take 1 tablet (40 mg total) by mouth daily.  90 tablet  1  . meloxicam (MOBIC) 15 MG tablet Take 1 tablet by mouth  Daily.      . Olmesartan-Amlodipine-HCTZ (TRIBENZOR) 40-5-25 MG TABS Take 1 tablet by mouth daily.  90 tablet  1  . omeprazole (PRILOSEC) 20 MG capsule Take 1 capsule (20 mg total) by mouth daily.  90 capsule  1  . PROTOPIC 0.1 % ointment Apply 1 application topically 2 (two) times daily as needed.      . simvastatin (ZOCOR) 20 MG tablet Take 1 tablet (20 mg total) by mouth at bedtime.  90 tablet  1  . DISCONTD: colesevelam (WELCHOL) 625 MG tablet Take 3,750 mg by mouth daily.            Review of Systems    see HPI Objective:   Physical Exam  Physical Exam  Nursing note and vitals reviewed.  Constitutional: She is oriented to person, place, and time. She appears well-developed and well-nourished.  HENT:  Head: Normocephalic and atraumatic.  Cardiovascular: Normal rate and regular rhythm. Exam reveals no gallop and no friction rub.  No murmur heard.  Pulmonary/Chest: Breath sounds normal. She has no wheezes. She has no rales.  Neurological: She is alert and oriented to  person, place, and time.  Skin: Skin is warm and dry.  Red rash with grouped vesicles  Dermatomal pattern R side of thorax.  No drainage Psychiatric: She has a normal mood and affect. Her behavior is normal.        Assessment & Plan:  1)  Shingles  Ok for Valtrex 1 gm po bid for 10 days  Advised shingles vaccine in 8-12 months

## 2012-02-23 NOTE — Patient Instructions (Signed)
Take medcine as prescribed   See me if not improved

## 2012-02-24 ENCOUNTER — Other Ambulatory Visit: Payer: Self-pay | Admitting: Internal Medicine

## 2012-03-22 ENCOUNTER — Other Ambulatory Visit: Payer: Self-pay | Admitting: *Deleted

## 2012-03-22 DIAGNOSIS — I10 Essential (primary) hypertension: Secondary | ICD-10-CM

## 2012-03-22 MED ORDER — OLMESARTAN-AMLODIPINE-HCTZ 40-5-25 MG PO TABS: 1.0000 | ORAL_TABLET | Freq: Every day | ORAL | Status: DC

## 2012-07-22 ENCOUNTER — Other Ambulatory Visit: Payer: Self-pay | Admitting: Internal Medicine

## 2012-07-22 DIAGNOSIS — Z1231 Encounter for screening mammogram for malignant neoplasm of breast: Secondary | ICD-10-CM

## 2012-07-29 ENCOUNTER — Other Ambulatory Visit: Payer: Self-pay | Admitting: Internal Medicine

## 2012-08-01 ENCOUNTER — Telehealth: Payer: Self-pay | Admitting: Internal Medicine

## 2012-08-01 NOTE — Telephone Encounter (Signed)
Informed pt that rx was sent in on Friday and to check with pharmacy today

## 2012-08-01 NOTE — Telephone Encounter (Signed)
Pt states she needs her refill of Omeprazole (Capsule Delayed Release) PRILOSEC 20 MG TAKE 1 CAPSULE DAILY She states [facility] sent in a request and they have not recd a response back.Marland KitchenMarland Kitchen

## 2012-08-11 ENCOUNTER — Ambulatory Visit (HOSPITAL_COMMUNITY)
Admission: RE | Admit: 2012-08-11 | Discharge: 2012-08-11 | Disposition: A | Discharge status: Home / Self Care | Payer: 59 | Source: Ambulatory Visit | Attending: Internal Medicine | Admitting: Internal Medicine

## 2012-08-11 DIAGNOSIS — Z1231 Encounter for screening mammogram for malignant neoplasm of breast: Secondary | ICD-10-CM | POA: Insufficient documentation

## 2012-08-15 ENCOUNTER — Other Ambulatory Visit: Payer: Self-pay | Admitting: Internal Medicine

## 2012-08-15 NOTE — Telephone Encounter (Signed)
See Pamela Malone's note pt would like a 90 day supply

## 2012-08-15 NOTE — Telephone Encounter (Signed)
Pt states she needs her Omeprazole (Capsule Delayed Release) PRILOSEC 20 MG TAKE 1 CAPSULE DAILY Sent to Mail order to [facility] as a 90 day supply... She said it was sent as an one month supply and was charge 84 dollars.. They need that resent back... Per pt is it usually a three month supply.. Please cll at [phone]

## 2012-08-16 MED ORDER — OMEPRAZOLE 20 MG PO CPDR: 20.0000 mg | DELAYED_RELEASE_CAPSULE | Freq: Every day | ORAL | Status: DC

## 2012-08-29 ENCOUNTER — Other Ambulatory Visit: Payer: Self-pay | Admitting: *Deleted

## 2012-08-29 DIAGNOSIS — E785 Hyperlipidemia, unspecified: Secondary | ICD-10-CM

## 2012-08-29 DIAGNOSIS — I10 Essential (primary) hypertension: Secondary | ICD-10-CM

## 2012-08-29 MED ORDER — OMEPRAZOLE 20 MG PO CPDR: 20.0000 mg | DELAYED_RELEASE_CAPSULE | Freq: Every day | ORAL | Status: DC

## 2012-08-29 MED ORDER — SIMVASTATIN 20 MG PO TABS: 20.0000 mg | ORAL_TABLET | Freq: Every day | ORAL | Status: DC

## 2012-08-29 MED ORDER — OLMESARTAN-AMLODIPINE-HCTZ 40-5-25 MG PO TABS: 1.0000 | ORAL_TABLET | Freq: Every day | ORAL | Status: DC

## 2012-08-29 NOTE — Telephone Encounter (Signed)
Bobbie   Call pharmacy to clarify [facility] for 90 days worth  Thanks

## 2012-08-29 NOTE — Telephone Encounter (Addendum)
Can you please order 90 days instead of 30 for prilosec and refill request for the other two. Pt husband called upset that this is the second time it has been sent for a 30 day supply

## 2012-08-29 NOTE — Addendum Note (Signed)
Addended by: Raechel Chute D on: 08/29/2012 06:01 PM   Modules accepted: Orders

## 2012-08-30 ENCOUNTER — Telehealth: Payer: Self-pay | Admitting: *Deleted

## 2012-08-30 NOTE — Telephone Encounter (Signed)
Called [facility] to confirm that Prilosec is for a 90 day supply spoke with Lance Bosch who states that she sees where rx were sent but it takes 24 to 48 hours for full processing

## 2012-10-04 ENCOUNTER — Telehealth: Payer: Self-pay | Admitting: *Deleted

## 2012-10-13 NOTE — Telephone Encounter (Signed)
appt made

## 2012-11-14 ENCOUNTER — Other Ambulatory Visit: Payer: Self-pay | Admitting: Internal Medicine

## 2012-11-14 DIAGNOSIS — I10 Essential (primary) hypertension: Secondary | ICD-10-CM

## 2012-11-14 DIAGNOSIS — E785 Hyperlipidemia, unspecified: Secondary | ICD-10-CM

## 2012-11-14 MED ORDER — SIMVASTATIN 20 MG PO TABS: 20.0000 mg | ORAL_TABLET | Freq: Every day | ORAL | Status: DC

## 2012-11-14 MED ORDER — OLMESARTAN-AMLODIPINE-HCTZ 40-5-25 MG PO TABS: 1.0000 | ORAL_TABLET | Freq: Every day | ORAL | Status: DC

## 2012-11-14 NOTE — Telephone Encounter (Signed)
Refill request

## 2012-11-14 NOTE — Telephone Encounter (Signed)
Pt states she needs a call back to go over medication.. She states there are medications that needs to be refill but she need to make sure they will be prescribe.. Please call pt at  [phone]

## 2012-12-12 ENCOUNTER — Encounter: Payer: Self-pay | Admitting: Internal Medicine

## 2012-12-12 ENCOUNTER — Ambulatory Visit (INDEPENDENT_AMBULATORY_CARE_PROVIDER_SITE_OTHER): Payer: 59 | Admitting: Internal Medicine

## 2012-12-12 VITALS — BP 138/86 | HR 84 | Temp 96.7°F | Resp 18 | Ht 61.0 in | Wt 189.0 lb

## 2012-12-12 DIAGNOSIS — I10 Essential (primary) hypertension: Secondary | ICD-10-CM

## 2012-12-12 DIAGNOSIS — R0981 Nasal congestion: Secondary | ICD-10-CM

## 2012-12-12 DIAGNOSIS — J3489 Other specified disorders of nose and nasal sinuses: Secondary | ICD-10-CM

## 2012-12-12 DIAGNOSIS — J329 Chronic sinusitis, unspecified: Secondary | ICD-10-CM

## 2012-12-12 MED ORDER — AZITHROMYCIN 250 MG PO TABS: ORAL_TABLET | ORAL | Status: DC

## 2012-12-12 NOTE — Progress Notes (Signed)
Subjective:    Patient ID: Pamela Malone, female    DOB: October 01, 1950, 62 y.o.   MRN: [id number]  HPI Pamela Malone is here for acute visit.  2 weeks of stuffy nose now sinus pain both maxillary sinuses   No fever no sore throat no cough  "I have been using way too much afrin nasal spray"  No Known Allergies Past Medical History  Diagnosis Date  . Scoliosis   . Menopause   . Psoriasis   . Low back pain   . DJD (degenerative joint disease)   . Depression   . GERD (gastroesophageal reflux disease)   . Hyperlipidemia   . Hypertension    Past Surgical History  Procedure Laterality Date  . Carpal tunnel release  bilateral  . Dilation and curettage of uterus  2009   History   Social History  . Marital Status: Married    Spouse Name: N/A    Number of Children: 2  . Years of Education: HS grad   Occupational History  . Retired    Social History Main Topics  . Smoking status: Never Smoker   . Smokeless tobacco: Never Used  . Alcohol Use: 2.0 oz/week    4 drink(s) per week  . Drug Use: No  . Sexually Active: Yes   Other Topics Concern  . Not on file   Social History Narrative  . No narrative on file   Family History  Problem Relation Age of Onset  . Hypertension Mother   . Stroke Mother   . Diabetes Paternal Grandmother   . Alzheimer's disease Mother   . Parkinsonism Paternal Grandmother    Patient Active Problem List  Diagnosis  . DJD (degenerative joint disease)  . Scoliosis  . Low back pain  . Depression  . GERD (gastroesophageal reflux disease)  . Hyperlipidemia  . Hypertension  . Menopause  . Psoriasis  . HPV in female  . Shingles   Current Outpatient Prescriptions on File Prior to Visit  Medication Sig Dispense Refill  . cetirizine (ZYRTEC) 10 MG tablet Take 10 mg by mouth daily.        . meloxicam (MOBIC) 15 MG tablet Take 7.5 mg by mouth 2 (two) times daily.       . Olmesartan-Amlodipine-HCTZ (TRIBENZOR) 40-5-25 MG TABS Take 1 tablet by mouth  daily.  90 tablet  1  . omeprazole (PRILOSEC) 20 MG capsule Take 1 capsule (20 mg total) by mouth daily.  90 capsule  1  . PROTOPIC 0.1 % ointment Apply 1 application topically 2 (two) times daily as needed.      . simvastatin (ZOCOR) 20 MG tablet Take 1 tablet (20 mg total) by mouth at bedtime.  90 tablet  1  . citalopram (CELEXA) 40 MG tablet Take 1 tablet (40 mg total) by mouth daily.  90 tablet  1  . [DISCONTINUED] colesevelam (WELCHOL) 625 MG tablet Take 3,750 mg by mouth daily.         No current facility-administered medications on file prior to visit.      Review of Systems    see HPI Objective:   Physical Exam . Physical Exam  Constitutional: She is oriented to person, place, and time. She appears well-developed and well-nourished. She is cooperative.  HENT:  Head: Normocephalic and atraumatic.  Right Ear: A middle ear effusion is present.  Left Ear: A middle ear effusion is present.  Nose: Mucosal edema present. Right sinus exhibits maxillary sinus tenderness. Left sinus exhibits maxillary  sinus tenderness.  Mouth/Throat: Posterior oropharyngeal erythema present.  Serous effusion bilaterally  Eyes: Conjunctivae and EOM are normal. Pupils are equal, round, and reactive to light.  Neck: Neck supple. Carotid bruit is not present. No mass present.  Cardiovascular: Regular rhythm, normal heart sounds, intact distal pulses and normal pulses. Exam reveals no gallop and no friction rub.  No murmur heard.  Pulmonary/Chest: Breath sounds normal. She has no wheezes. She has no rhonchi. She has no rales.  Neurological: She is alert and oriented to person, place, and time.  Skin: Skin is warm and dry. No abrasion, no bruising, no ecchymosis and no rash noted. No cyanosis. Nails show no clubbing.  Psychiatric: She has a normal mood and affect. Her speech is normal and behavior is normal.            Assessment & Plan:  Sinusitis   Will give Z pack  Nasal stuffiness  Advised   To stop using Afrin.  BP today my be elevated due to Afrin overuse  HTN  See above

## 2012-12-12 NOTE — Patient Instructions (Addendum)
See me if not better 

## 2012-12-13 ENCOUNTER — Encounter: Payer: 59 | Admitting: Internal Medicine

## 2012-12-22 ENCOUNTER — Encounter: Payer: Self-pay | Admitting: Internal Medicine

## 2012-12-22 ENCOUNTER — Telehealth: Payer: Self-pay | Admitting: Internal Medicine

## 2012-12-22 ENCOUNTER — Ambulatory Visit (INDEPENDENT_AMBULATORY_CARE_PROVIDER_SITE_OTHER): Payer: 59 | Admitting: Internal Medicine

## 2012-12-22 VITALS — BP 136/86 | HR 81 | Temp 97.2°F | Resp 20 | Wt 181.0 lb

## 2012-12-22 DIAGNOSIS — T485X5A Adverse effect of other anti-common-cold drugs, initial encounter: Secondary | ICD-10-CM | POA: Insufficient documentation

## 2012-12-22 DIAGNOSIS — H65499 Other chronic nonsuppurative otitis media, unspecified ear: Secondary | ICD-10-CM

## 2012-12-22 DIAGNOSIS — J329 Chronic sinusitis, unspecified: Secondary | ICD-10-CM

## 2012-12-22 DIAGNOSIS — J310 Chronic rhinitis: Secondary | ICD-10-CM

## 2012-12-22 MED ORDER — CEFTRIAXONE SODIUM 1 G IJ SOLR
500.0000 mg | Freq: Once | INTRAMUSCULAR | Status: AC
  Administered 2012-12-22: 500 mg via INTRAMUSCULAR

## 2012-12-22 MED ORDER — PREDNISONE 20 MG PO TABS: ORAL_TABLET | ORAL | Status: DC

## 2012-12-22 MED ORDER — FLUTICASONE PROPIONATE 50 MCG/ACT NA SUSP: 2.0000 | Freq: Every day | NASAL | Status: DC

## 2012-12-22 NOTE — Addendum Note (Signed)
Addended by: Mathews Robinsons on: 12/22/2012 04:42 PM   Modules accepted: Orders

## 2012-12-22 NOTE — Progress Notes (Signed)
Subjective:    Patient ID: Pamela Malone, female    DOB: 03-05-1951, 62 y.o.   MRN: [id number]  HPI Pamela Malone is here with continued nasal congestion.  She reports she is using OTC Afrin nasal spray for the past several weeks  "I can't stand to have a stuffy nose"   No fever no sore throat  Cough improved.   No Known Allergies Past Medical History  Diagnosis Date  . Scoliosis   . Menopause   . Psoriasis   . Low back pain   . DJD (degenerative joint disease)   . Depression   . GERD (gastroesophageal reflux disease)   . Hyperlipidemia   . Hypertension    Past Surgical History  Procedure Laterality Date  . Carpal tunnel release  bilateral  . Dilation and curettage of uterus  2009   History   Social History  . Marital Status: Married    Spouse Name: N/A    Number of Children: 2  . Years of Education: HS grad   Occupational History  . Retired    Social History Main Topics  . Smoking status: Never Smoker   . Smokeless tobacco: Never Used  . Alcohol Use: 2.0 oz/week    4 drink(s) per week  . Drug Use: No  . Sexually Active: Yes   Other Topics Concern  . Not on file   Social History Narrative  . No narrative on file   Family History  Problem Relation Age of Onset  . Hypertension Mother   . Stroke Mother   . Diabetes Paternal Grandmother   . Alzheimer's disease Mother   . Parkinsonism Paternal Grandmother    Patient Active Problem List  Diagnosis  . DJD (degenerative joint disease)  . Scoliosis  . Low back pain  . Depression  . GERD (gastroesophageal reflux disease)  . Hyperlipidemia  . Hypertension  . Menopause  . Psoriasis  . HPV in female  . Shingles  . Rhinitis medicamentosa   Current Outpatient Prescriptions on File Prior to Visit  Medication Sig Dispense Refill  . cetirizine (ZYRTEC) 10 MG tablet Take 10 mg by mouth daily.        . meloxicam (MOBIC) 15 MG tablet Take 7.5 mg by mouth 2 (two) times daily.       . Olmesartan-Amlodipine-HCTZ  (TRIBENZOR) 40-5-25 MG TABS Take 1 tablet by mouth daily.  90 tablet  1  . omeprazole (PRILOSEC) 20 MG capsule Take 1 capsule (20 mg total) by mouth daily.  90 capsule  1  . simvastatin (ZOCOR) 20 MG tablet Take 1 tablet (20 mg total) by mouth at bedtime.  90 tablet  1  . citalopram (CELEXA) 40 MG tablet Take 1 tablet (40 mg total) by mouth daily.  90 tablet  1  . PROTOPIC 0.1 % ointment Apply 1 application topically 2 (two) times daily as needed.      . [DISCONTINUED] colesevelam (WELCHOL) 625 MG tablet Take 3,750 mg by mouth daily.         No current facility-administered medications on file prior to visit.       Review of Systems See HPI    Objective:   Physical Exam  Physical Exam  Constitutional: She is oriented to person, place, and time. She appears well-developed and well-nourished. She is cooperative.  HENT:  Head: Normocephalic and atraumatic.  Right Ear: A middle ear effusion is present.  Left Ear: A middle ear effusion is present.  Nose: Mucosal edema present. Right  sinus exhibits maxillary sinus tenderness. Left sinus exhibits maxillary sinus tenderness.   Serous effusion bilaterally  Eyes: Conjunctivae and EOM are normal. Pupils are equal, round, and reactive to light.  Neck: Neck supple. Carotid bruit is not present. No mass present.  Cardiovascular: Regular rhythm, normal heart sounds, intact distal pulses and normal pulses. Exam reveals no gallop and no friction rub.  No murmur heard.  Pulmonary/Chest: Breath sounds normal. She has no wheezes. She has no rhonchi. She has no rales.  Neurological: She is alert and oriented to person, place, and time.  Skin: Skin is warm and dry. No abrasion, no bruising, no ecchymosis and no rash noted. No cyanosis. Nails show no clubbing.  Psychiatric: She has a normal mood and affect. Her speech is normal and behavior is normal.            Assessment & Plan:  Nasal congestion/rhinitis medicamatosa :  I clinically suspect   Afrin overuse.  Will give prednisone taper and flonase nasal spray for one bottle.  Advised to stop use of Afrin nasal spray  Ok to give Rocephin 500 mg in office

## 2012-12-22 NOTE — Telephone Encounter (Signed)
Pt continues to have sinusitis sx advised pt to come into office today

## 2012-12-22 NOTE — Telephone Encounter (Signed)
Pt states she still feels bad from a week ago, from the sinus infection and would like something called into [facility] [address] IN [place]...  PLS  CALL PT [phone]

## 2012-12-22 NOTE — Patient Instructions (Addendum)
See me if needed 

## 2013-01-02 ENCOUNTER — Encounter: Payer: Self-pay | Admitting: Internal Medicine

## 2013-01-02 ENCOUNTER — Ambulatory Visit (INDEPENDENT_AMBULATORY_CARE_PROVIDER_SITE_OTHER): Payer: 59 | Admitting: Internal Medicine

## 2013-01-02 VITALS — BP 128/76 | HR 86 | Temp 97.8°F | Resp 18 | Ht 61.0 in | Wt 189.0 lb

## 2013-01-02 DIAGNOSIS — M4120 Other idiopathic scoliosis, site unspecified: Secondary | ICD-10-CM

## 2013-01-02 DIAGNOSIS — Z780 Asymptomatic menopausal state: Secondary | ICD-10-CM

## 2013-01-02 DIAGNOSIS — M419 Scoliosis, unspecified: Secondary | ICD-10-CM

## 2013-01-02 DIAGNOSIS — Z0000 Encounter for general adult medical examination without abnormal findings: Secondary | ICD-10-CM

## 2013-01-02 DIAGNOSIS — J310 Chronic rhinitis: Secondary | ICD-10-CM

## 2013-01-02 DIAGNOSIS — B977 Papillomavirus as the cause of diseases classified elsewhere: Secondary | ICD-10-CM

## 2013-01-02 DIAGNOSIS — E785 Hyperlipidemia, unspecified: Secondary | ICD-10-CM

## 2013-01-02 DIAGNOSIS — I10 Essential (primary) hypertension: Secondary | ICD-10-CM

## 2013-01-02 LAB — COMPREHENSIVE METABOLIC PANEL
Albumin: 4.4 g/dL (ref 3.5–5.2)
Alkaline Phosphatase: 67 U/L (ref 39–117)
BUN: 19 mg/dL (ref 6–23)
CO2: 29 mEq/L (ref 19–32)
Glucose, Bld: 117 mg/dL — ABNORMAL HIGH (ref 70–99)
Potassium: 5.6 mEq/L — ABNORMAL HIGH (ref 3.5–5.3)
Total Bilirubin: 0.6 mg/dL (ref 0.3–1.2)

## 2013-01-02 LAB — LIPID PANEL
Cholesterol: 237 mg/dL — ABNORMAL HIGH (ref 0–200)
HDL: 79 mg/dL (ref >39)
LDL Cholesterol: 127 mg/dL — ABNORMAL HIGH (ref 0–99)
Triglycerides: 153 mg/dL — ABNORMAL HIGH (ref <150)

## 2013-01-02 LAB — CBC WITH DIFFERENTIAL/PLATELET
Eosinophils Absolute: 0.2 10*3/uL (ref 0.0–0.7)
Eosinophils Relative: 2 % (ref 0–5)
HCT: 43.7 % (ref 36.0–46.0)
Hemoglobin: 14.6 g/dL (ref 12.0–15.0)
Lymphocytes Relative: 39 % (ref 12–46)
Lymphs Abs: 3.9 10*3/uL (ref 0.7–4.0)
MCH: 30.5 pg (ref 26.0–34.0)
MCV: 91.4 fL (ref 78.0–100.0)
Monocytes Absolute: 0.6 10*3/uL (ref 0.1–1.0)
Monocytes Relative: 6 % (ref 3–12)
Platelets: 362 10*3/uL (ref 150–400)
RBC: 4.78 MIL/uL (ref 3.87–5.11)

## 2013-01-02 LAB — POCT URINALYSIS DIPSTICK
Bilirubin, UA: NEGATIVE
Blood, UA: NEGATIVE
Glucose, UA: NEGATIVE
Ketones, UA: NEGATIVE
Spec Grav, UA: <=1.005
pH, UA: 7.5

## 2013-01-02 NOTE — Patient Instructions (Addendum)
See me as needed 

## 2013-01-02 NOTE — Progress Notes (Signed)
Subjective:    Patient ID: Pamela Malone, female    DOB: 12/30/1950, 62 y.o.   MRN: [id number]  HPI Pamela Malone is here for CPE.   She wishes to discuss HPV  .  She is tearful and angry as she states she has had only one sexual partner and married just after high school.    Flonase doing well for nasal congestion.  She has stopped using Afrin.  Much less congestion.   No cough no chest pain  No Known Allergies Past Medical History  Diagnosis Date  . Scoliosis   . Menopause   . Psoriasis   . Low back pain   . DJD (degenerative joint disease)   . Depression   . GERD (gastroesophageal reflux disease)   . Hyperlipidemia   . Hypertension    Past Surgical History  Procedure Laterality Date  . Carpal tunnel release  bilateral  . Dilation and curettage of uterus  2009   History   Social History  . Marital Status: Married    Spouse Name: N/A    Number of Children: 2  . Years of Education: HS grad   Occupational History  . Retired    Social History Main Topics  . Smoking status: Never Smoker   . Smokeless tobacco: Never Used  . Alcohol Use: 2.0 oz/week    4 drink(s) per week  . Drug Use: No  . Sexually Active: Yes   Other Topics Concern  . Not on file   Social History Narrative  . No narrative on file   Family History  Problem Relation Age of Onset  . Hypertension Mother   . Stroke Mother   . Diabetes Paternal Grandmother   . Alzheimer's disease Mother   . Parkinsonism Paternal Grandmother    Patient Active Problem List  Diagnosis  . DJD (degenerative joint disease)  . Scoliosis  . Low back pain  . Depression  . GERD (gastroesophageal reflux disease)  . Hyperlipidemia  . Hypertension  . Menopause  . Psoriasis  . HPV in female  . Shingles  . Rhinitis medicamentosa   Current Outpatient Prescriptions on File Prior to Visit  Medication Sig Dispense Refill  . cetirizine (ZYRTEC) 10 MG tablet Take 10 mg by mouth daily.        . fluticasone (FLONASE) 50 MCG/ACT  nasal spray Place 2 sprays into the nose daily.  16 g  6  . meloxicam (MOBIC) 15 MG tablet Take 7.5 mg by mouth 2 (two) times daily.       . Olmesartan-Amlodipine-HCTZ (TRIBENZOR) 40-5-25 MG TABS Take 1 tablet by mouth daily.  90 tablet  1  . omeprazole (PRILOSEC) 20 MG capsule Take 1 capsule (20 mg total) by mouth daily.  90 capsule  1  . PROTOPIC 0.1 % ointment Apply 1 application topically 2 (two) times daily as needed.      . simvastatin (ZOCOR) 20 MG tablet Take 1 tablet (20 mg total) by mouth at bedtime.  90 tablet  1  . citalopram (CELEXA) 40 MG tablet Take 1 tablet (40 mg total) by mouth daily.  90 tablet  1  . [DISCONTINUED] colesevelam (WELCHOL) 625 MG tablet Take 3,750 mg by mouth daily.         No current facility-administered medications on file prior to visit.       Review of Systems  All other systems reviewed and are negative.       Objective:   Physical Exam Physical Exam  Nursing note and vitals reviewed.  Constitutional: She is oriented to person, place, and time. She appears well-developed and well-nourished.  HENT:  Head: Normocephalic and atraumatic.  Right Ear: Tympanic membrane and ear canal normal. No drainage. Tympanic membrane is not injected and not erythematous.  Left Ear: Tympanic membrane and ear canal normal. No drainage. Tympanic membrane is not injected and not erythematous.  Nose: Nose normal. Right sinus exhibits no maxillary sinus tenderness and no frontal sinus tenderness. Left sinus exhibits no maxillary sinus tenderness and no frontal sinus tenderness.  Mouth/Throat: Oropharynx is clear and moist. No oral lesions. No oropharyngeal exudate.  Eyes: Conjunctivae and EOM are normal. Pupils are equal, round, and reactive to light.  Neck: Normal range of motion. Neck supple. No JVD present. Carotid bruit is not present. No mass and no thyromegaly present.  Cardiovascular: Normal rate, regular rhythm, S1 normal, S2 normal and intact distal pulses.  Exam reveals no gallop and no friction rub.  No murmur heard.  Pulses:  Carotid pulses are 2+ on the right side, and 2+ on the left side.  Dorsalis pedis pulses are 2+ on the right side, and 2+ on the left side.  No carotid bruit. No LE edema  Pulmonary/Chest: Breath sounds normal. She has no wheezes. She has no rales. She exhibits no tenderness. Breast no discrete mass no nipple discharge no axillary adenopathy bilaterally.   Abdominal: Soft. Bowel sounds are normal. She exhibits no distension and no mass. There is no hepatosplenomegaly. There is no tenderness. There is no CVA tenderness. Rectal no mass guaiac neg.   Musculoskeletal: Normal range of motion.  No active synovitis to joints.  Lymphadenopathy:  She has no cervical adenopathy.  She has no axillary adenopathy.  Right: No inguinal and no supraclavicular adenopathy present.  Left: No inguinal and no supraclavicular adenopathy present.  Neurological: She is alert and oriented to person, place, and time. She has normal strength and normal reflexes. She displays no tremor. No cranial nerve deficit or sensory deficit. Coordination and gait normal.  Skin: Skin is warm and dry. No rash noted. No cyanosis. Nails show no clubbing.  Psychiatric: She has a normal mood and affect. Her speech is normal and behavior is normal. Cognition and memory are normal.        Assessment & Plan:  Health Maintenance:   Discussed   Shingles vaccine and gave info.  She repeatedly declines colonoscopy  Despite risks of possible colon CA.  I counseled she needs colonoscopy MM due in the fall  See scanned sheet  HPV pos  With neg pap cytology  Pt does not wish pap today.  OK to defer for one year only.  Counseled that HPV virus transmitted skin to skin contact.    HTN  Well controlled  Hyperlipidemia  Will check today  With other labs.    Rhinitis  Improved with flonase  H/O scoliosis

## 2013-01-05 ENCOUNTER — Telehealth: Payer: Self-pay | Admitting: *Deleted

## 2013-01-05 NOTE — Telephone Encounter (Signed)
Message copied by Mathews Robinsons on Thu Jan 05, 2013  8:30 AM ------      Message from: Raechel Chute D      Created: Tue Jan 03, 2013 11:12 AM       Karen Kitchens              Call Parlee and let her know that her K is slightly high.  I want to see her in office in 3-4 weeks to repeat this and discuss if we need to alter the dose of BP pill.            Schedule her an office visit in 3 weeks to see me .              OK to mail labs to her ------

## 2013-01-05 NOTE — Telephone Encounter (Signed)
LVM to return call awaiting return call

## 2013-02-02 ENCOUNTER — Telehealth: Payer: Self-pay | Admitting: Internal Medicine

## 2013-02-02 NOTE — Telephone Encounter (Signed)
Pamela Malone  Call pt and tell her I want to see her in office as her K was very slightly elevated.   It may be due to her BP pill and if still elevated when I recheck, I need to adjust her medication.  She needs OV in 1-2 weeks  Message back with appt date  Thanks

## 2013-02-02 NOTE — Telephone Encounter (Signed)
LVM message for pt to return call  

## 2013-02-13 NOTE — Telephone Encounter (Signed)
appt made for pt on Monday May 22 at 11:30

## 2013-02-16 ENCOUNTER — Encounter: Payer: Self-pay | Admitting: *Deleted

## 2013-02-16 ENCOUNTER — Encounter: Payer: Self-pay | Admitting: Internal Medicine

## 2013-02-16 ENCOUNTER — Ambulatory Visit (INDEPENDENT_AMBULATORY_CARE_PROVIDER_SITE_OTHER): Payer: 59 | Admitting: Internal Medicine

## 2013-02-16 VITALS — BP 135/87 | HR 72 | Temp 97.2°F | Resp 18 | Wt 188.0 lb

## 2013-02-16 DIAGNOSIS — I10 Essential (primary) hypertension: Secondary | ICD-10-CM

## 2013-02-16 DIAGNOSIS — E875 Hyperkalemia: Secondary | ICD-10-CM

## 2013-02-16 NOTE — Patient Instructions (Addendum)
See me for pelvic/pap exam

## 2013-02-16 NOTE — Progress Notes (Signed)
Subjective:    Patient ID: Pamela Malone, female    DOB: 1951/04/23, 62 y.o.   MRN: [id number]  HPI  Pamela Malone is here for follow up.  See labs  K slightly elevated on Tribenzor.  Creatinine normal  She is asymptomatic.   No Known Allergies Past Medical History  Diagnosis Date  . Scoliosis   . Menopause   . Psoriasis   . Low back pain   . DJD (degenerative joint disease)   . Depression   . GERD (gastroesophageal reflux disease)   . Hyperlipidemia   . Hypertension    Past Surgical History  Procedure Laterality Date  . Carpal tunnel release  bilateral  . Dilation and curettage of uterus  2009   History   Social History  . Marital Status: Married    Spouse Name: N/A    Number of Children: 2  . Years of Education: HS grad   Occupational History  . Retired    Social History Main Topics  . Smoking status: Never Smoker   . Smokeless tobacco: Never Used  . Alcohol Use: 2.0 oz/week    4 drink(s) per week  . Drug Use: No  . Sexually Active: Yes   Other Topics Concern  . Not on file   Social History Narrative  . No narrative on file   Family History  Problem Relation Age of Onset  . Hypertension Mother   . Stroke Mother   . Diabetes Paternal Grandmother   . Alzheimer's disease Mother   . Parkinsonism Paternal Grandmother    Patient Active Problem List   Diagnosis Date Noted  . 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 Shingles 02/23/2012  . 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 DJD (degenerative joint disease)   . Scoliosis   . Low back pain   . Depression   . GERD (gastroesophageal reflux disease)   . Hyperlipidemia   . Hypertension   . Menopause   . Psoriasis    Current Outpatient Prescriptions on File Prior to Visit  Medication Sig Dispense Refill  . citalopram (CELEXA) 40 MG tablet Take 1 tablet (40 mg total) by mouth daily.  90 tablet  1  . fluticasone (FLONASE) 50 MCG/ACT nasal spray Place 2 sprays into the nose daily.  16 g  6  . HYDROcodone-acetaminophen  (NORCO) 7.5-325 MG per tablet Take 1 tablet by mouth every 8 (eight) hours as needed for pain.      . meloxicam (MOBIC) 15 MG tablet Take 7.5 mg by mouth 2 (two) times daily.       . Olmesartan-Amlodipine-HCTZ (TRIBENZOR) 40-5-25 MG TABS Take 1 tablet by mouth daily.  90 tablet  1  . omeprazole (PRILOSEC) 20 MG capsule Take 1 capsule (20 mg total) by mouth daily.  90 capsule  1  . PROTOPIC 0.1 % ointment Apply 1 application topically 2 (two) times daily as needed.      . simvastatin (ZOCOR) 20 MG tablet Take 1 tablet (20 mg total) by mouth at bedtime.  90 tablet  1  . cetirizine (ZYRTEC) 10 MG tablet Take 10 mg by mouth daily.        . [DISCONTINUED] colesevelam (WELCHOL) 625 MG tablet Take 3,750 mg by mouth daily.         No current facility-administered medications on file prior to visit.       Review of Systems See HPI    Objective:   Physical Exam  Physical Exam  Nursing note and vitals reviewed.  Constitutional: She is oriented to person, place, and time. She appears well-developed and well-nourished.  HENT:  Head: Normocephalic and atraumatic.  Cardiovascular: Normal rate and regular rhythm. Exam reveals no gallop and no friction rub.  No murmur heard.  Pulmonary/Chest: Breath sounds normal. She has no wheezes. She has no rales.  Neurological: She is alert and oriented to person, place, and time.  Skin: Skin is warm and dry.  Psychiatric: She has a normal mood and affect. Her behavior is normal.        Assessment & Plan:  Hyperkalemia  Will recheck today  HTN   No change in med at this point  HPV pos  Will schedule follow up pap

## 2013-02-17 LAB — BASIC METABOLIC PANEL
BUN: 18 mg/dL (ref 6–23)
CO2: 25 mEq/L (ref 19–32)
Chloride: 100 mEq/L (ref 96–112)
Creat: 0.89 mg/dL (ref 0.50–1.10)
Potassium: 4.8 mEq/L (ref 3.5–5.3)

## 2013-02-20 ENCOUNTER — Encounter: Payer: Self-pay | Admitting: Internal Medicine

## 2013-02-20 DIAGNOSIS — Z8639 Personal history of other endocrine, nutritional and metabolic disease: Secondary | ICD-10-CM | POA: Insufficient documentation

## 2013-02-21 ENCOUNTER — Encounter: Payer: Self-pay | Admitting: *Deleted

## 2013-02-21 ENCOUNTER — Telehealth: Payer: Self-pay | Admitting: *Deleted

## 2013-02-21 NOTE — Telephone Encounter (Signed)
Message copied by Mathews Robinsons on Tue Feb 21, 2013  3:30 PM ------      Message from: Raechel Chute D      Created: Mon Feb 20, 2013  9:39 AM       Iana Buzan              Add HGB AIC  To lab            Call [place]dee and let her know that her glucose is slightly higher.  Advise her to come in for a fasting accucheck at her convenience            Advise her to keep her July appt with me and I will check her sugar at that visit as well ------

## 2013-02-21 NOTE — Telephone Encounter (Signed)
HGB A1C added to previous labs  Called pt to come in for fasting BS. will come in on Thursday 5/29

## 2013-02-22 ENCOUNTER — Telehealth: Payer: Self-pay | Admitting: *Deleted

## 2013-02-22 NOTE — Telephone Encounter (Signed)
Labs mailed to pt home address.

## 2013-04-03 ENCOUNTER — Encounter: Payer: Self-pay | Admitting: Internal Medicine

## 2013-04-03 ENCOUNTER — Ambulatory Visit (INDEPENDENT_AMBULATORY_CARE_PROVIDER_SITE_OTHER): Payer: 59 | Admitting: Internal Medicine

## 2013-04-03 VITALS — BP 125/81 | HR 82 | Temp 97.5°F | Resp 18 | Wt 180.0 lb

## 2013-04-03 DIAGNOSIS — Z1151 Encounter for screening for human papillomavirus (HPV): Secondary | ICD-10-CM

## 2013-04-03 DIAGNOSIS — Z124 Encounter for screening for malignant neoplasm of cervix: Secondary | ICD-10-CM

## 2013-04-03 DIAGNOSIS — B977 Papillomavirus as the cause of diseases classified elsewhere: Secondary | ICD-10-CM

## 2013-04-03 NOTE — Progress Notes (Signed)
Subjective:    Patient ID: Pamela Malone, female    DOB: 01-19-1951, 62 y.o.   MRN: [id number]  HPI  Pamela Malone is here for repeat pap.  She was HPV positive in 11/2011.  No vaginal discharge  No Known Allergies Past Medical History  Diagnosis Date  . Scoliosis   . Menopause   . Psoriasis   . Low back pain   . DJD (degenerative joint disease)   . Depression   . GERD (gastroesophageal reflux disease)   . Hyperlipidemia   . Hypertension    Past Surgical History  Procedure Laterality Date  . Carpal tunnel release  bilateral  . Dilation and curettage of uterus  2009  . Ankle surgery Right     from a previous injury as a child    History   Social History  . Marital Status: Married    Spouse Name: N/A    Number of Children: 2  . Years of Education: HS grad   Occupational History  . Retired    Social History Main Topics  . Smoking status: Never Smoker   . Smokeless tobacco: Never Used  . Alcohol Use: 2.0 oz/week    4 drink(s) per week  . Drug Use: No  . Sexually Active: Yes   Other Topics Concern  . Not on file   Social History Narrative  . No narrative on file   Family History  Problem Relation Age of Onset  . Hypertension Mother   . Stroke Mother   . Diabetes Paternal Grandmother   . Alzheimer's disease Mother   . Parkinsonism Paternal Grandmother    Patient Active Problem List   Diagnosis Date Noted  . History of hyperglycemia 02/20/2013  . 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 Shingles 02/23/2012  . 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 DJD (degenerative joint disease)   . Scoliosis   . Low back pain   . Depression   . GERD (gastroesophageal reflux disease)   . Hyperlipidemia   . Hypertension   . Menopause   . Psoriasis    Current Outpatient Prescriptions on File Prior to Visit  Medication Sig Dispense Refill  . cetirizine (ZYRTEC) 10 MG tablet Take 10 mg by mouth daily.        . fluticasone (FLONASE) 50 MCG/ACT nasal spray Place 2 sprays into the nose  daily.  16 g  6  . HYDROcodone-acetaminophen (NORCO) 7.5-325 MG per tablet Take 1 tablet by mouth every 8 (eight) hours as needed for pain.      . meloxicam (MOBIC) 15 MG tablet Take 7.5 mg by mouth 2 (two) times daily.       . Olmesartan-Amlodipine-HCTZ (TRIBENZOR) 40-5-25 MG TABS Take 1 tablet by mouth daily.  90 tablet  1  . omeprazole (PRILOSEC) 20 MG capsule Take 1 capsule (20 mg total) by mouth daily.  90 capsule  1  . PROTOPIC 0.1 % ointment Apply 1 application topically 2 (two) times daily as needed.      . simvastatin (ZOCOR) 20 MG tablet Take 1 tablet (20 mg total) by mouth at bedtime.  90 tablet  1  . [DISCONTINUED] colesevelam (WELCHOL) 625 MG tablet Take 3,750 mg by mouth daily.         No current facility-administered medications on file prior to visit.     Review of Systems See HPI    Objective:   Physical Exam  Physical Exam  Nursing note and vitals reviewed.  Constitutional: She is oriented to person,  place, and time. She appears well-developed and well-nourished.  HENT:  Head: Normocephalic and atraumatic.  Cardiovascular: Normal rate and regular rhythm. Exam reveals no gallop and no friction rub.  No murmur heard.  Pulmonary/Chest: Breath sounds normal. She has no wheezes. She has no rales.  Pelvic  Normal external genitalia. Vagina no lesion   Cervix easily visualized pap obtained.  Neurological: She is alert and oriented to person, place, and time.  Skin: Skin is warm and dry.  Psychiatric: She has a normal mood and affect. Her behavior is normal.            Assessment & Plan:  HPV positive  Will resend today  Further management base on results

## 2013-04-03 NOTE — Patient Instructions (Addendum)
See me as needed 

## 2013-04-06 ENCOUNTER — Telehealth: Payer: Self-pay | Admitting: *Deleted

## 2013-04-06 ENCOUNTER — Encounter: Payer: Self-pay | Admitting: *Deleted

## 2013-04-06 NOTE — Telephone Encounter (Signed)
Message copied by Mathews Robinsons on Thu Apr 06, 2013  8:07 AM ------      Message from: Raechel Chute D      Created: Tue Apr 04, 2013  7:53 PM       Karen Kitchens            Call Darel Hong and let her know that her HPV test came back negative.    Her body has responded to rid of the virus.  We will repeat this at her routine yearly CPE exam            Mail this result to her ------

## 2013-04-06 NOTE — Telephone Encounter (Signed)
Notified pt of negative pap results mailed to pt home address

## 2013-05-16 ENCOUNTER — Other Ambulatory Visit: Payer: Self-pay | Admitting: *Deleted

## 2013-05-16 DIAGNOSIS — I10 Essential (primary) hypertension: Secondary | ICD-10-CM

## 2013-05-16 MED ORDER — OMEPRAZOLE 20 MG PO CPDR: 20.0000 mg | DELAYED_RELEASE_CAPSULE | Freq: Every day | ORAL | Status: DC

## 2013-05-16 MED ORDER — OLMESARTAN-AMLODIPINE-HCTZ 40-5-25 MG PO TABS: 1.0000 | ORAL_TABLET | Freq: Every day | ORAL | Status: DC

## 2013-05-16 MED ORDER — FLUTICASONE PROPIONATE 50 MCG/ACT NA SUSP: 2.0000 | Freq: Every day | NASAL | Status: DC

## 2013-05-16 NOTE — Telephone Encounter (Signed)
Refill Request.  Needs 90 day supply. [facility] ([facility])   Tribenzor 40-5-25 mg tabs Omeprazole 20 mg tabs Flonase

## 2013-05-16 NOTE — Telephone Encounter (Signed)
Refill request

## 2013-08-09 ENCOUNTER — Other Ambulatory Visit: Payer: Self-pay | Admitting: Internal Medicine

## 2013-08-09 DIAGNOSIS — Z1231 Encounter for screening mammogram for malignant neoplasm of breast: Secondary | ICD-10-CM

## 2013-08-30 ENCOUNTER — Ambulatory Visit (HOSPITAL_COMMUNITY)
Admission: RE | Admit: 2013-08-30 | Discharge: 2013-08-30 | Disposition: A | Discharge status: Home / Self Care | Payer: 59 | Source: Ambulatory Visit | Attending: Internal Medicine | Admitting: Internal Medicine

## 2013-08-30 DIAGNOSIS — Z1231 Encounter for screening mammogram for malignant neoplasm of breast: Secondary | ICD-10-CM

## 2013-09-04 ENCOUNTER — Other Ambulatory Visit: Payer: Self-pay | Admitting: *Deleted

## 2013-09-04 ENCOUNTER — Telehealth: Payer: Self-pay | Admitting: *Deleted

## 2013-09-04 DIAGNOSIS — E785 Hyperlipidemia, unspecified: Secondary | ICD-10-CM

## 2013-09-04 DIAGNOSIS — I10 Essential (primary) hypertension: Secondary | ICD-10-CM

## 2013-09-04 MED ORDER — SIMVASTATIN 20 MG PO TABS: 20.0000 mg | ORAL_TABLET | Freq: Every day | ORAL | Status: DC

## 2013-09-04 MED ORDER — OLMESARTAN-AMLODIPINE-HCTZ 40-5-25 MG PO TABS: 1.0000 | ORAL_TABLET | Freq: Every day | ORAL | Status: DC

## 2013-09-04 NOTE — Telephone Encounter (Signed)
Refill request

## 2013-09-04 NOTE — Telephone Encounter (Signed)
Pamela Malone called today; she needs a medication refill for simvastatin (ZOCOR) 20 MG tablet and Olmesartan-Amlodipine-HCTZ (TRIBENZOR) 40-5-25 MG TABS  Please call her and let her know this has been taken care of

## 2013-09-05 ENCOUNTER — Other Ambulatory Visit: Payer: Self-pay | Admitting: *Deleted

## 2013-10-10 ENCOUNTER — Other Ambulatory Visit: Payer: Self-pay | Admitting: Internal Medicine

## 2013-10-11 ENCOUNTER — Telehealth: Payer: Self-pay | Admitting: *Deleted

## 2013-10-11 ENCOUNTER — Other Ambulatory Visit: Payer: Self-pay | Admitting: *Deleted

## 2013-10-11 MED ORDER — OSELTAMIVIR PHOSPHATE 75 MG PO CAPS: 75.0000 mg | ORAL_CAPSULE | Freq: Every day | ORAL | Status: DC

## 2013-10-11 NOTE — Telephone Encounter (Signed)
Needs Tamiflu

## 2013-10-11 NOTE — Telephone Encounter (Signed)
Refill request

## 2013-10-12 ENCOUNTER — Other Ambulatory Visit: Payer: Self-pay | Admitting: *Deleted

## 2013-10-12 NOTE — Telephone Encounter (Signed)
Refill request was sent to wrong pharmacy and pt needs a 90 day supply.

## 2013-10-12 NOTE — Telephone Encounter (Signed)
Karen KitchensBobbie  This was done yesterday  Call pharmacy

## 2014-02-12 ENCOUNTER — Other Ambulatory Visit: Payer: Self-pay | Admitting: Internal Medicine

## 2014-02-12 NOTE — Telephone Encounter (Signed)
Refill request

## 2014-02-15 ENCOUNTER — Other Ambulatory Visit: Payer: Self-pay | Admitting: *Deleted

## 2014-02-15 NOTE — Telephone Encounter (Signed)
duplicate

## 2014-02-26 ENCOUNTER — Telehealth: Payer: Self-pay | Admitting: *Deleted

## 2014-02-26 ENCOUNTER — Other Ambulatory Visit: Payer: Self-pay | Admitting: *Deleted

## 2014-02-26 DIAGNOSIS — E785 Hyperlipidemia, unspecified: Secondary | ICD-10-CM

## 2014-02-26 NOTE — Telephone Encounter (Signed)
Refill request

## 2014-02-26 NOTE — Telephone Encounter (Signed)
Needs refill of simvastatin (ZOCOR) 20 MG tablet sent to [facility]

## 2014-02-27 MED ORDER — SIMVASTATIN 20 MG PO TABS: 20.0000 mg | ORAL_TABLET | Freq: Every day | ORAL | Status: DC

## 2014-05-22 ENCOUNTER — Other Ambulatory Visit: Payer: Self-pay | Admitting: Internal Medicine

## 2014-05-22 ENCOUNTER — Telehealth: Payer: Self-pay | Admitting: *Deleted

## 2014-05-22 DIAGNOSIS — I10 Essential (primary) hypertension: Secondary | ICD-10-CM

## 2014-05-22 NOTE — Telephone Encounter (Signed)
Requested Medications     Medication name:  Name from pharmacy:  simvastatin (ZOCOR) 20 MG tablet  SIMVASTATIN TAB     Sig: TAKE 1 TABLET AT BEDTIME    Dispense: 90 tablet Refills: 0 Start: 05/22/2014  Class: Normal    Requested on: 02/27/2014    Originally ordered on: 02/03/2011 Last refill: 02/27/2014 Order History and Details

## 2014-05-22 NOTE — Telephone Encounter (Signed)
Pt needs refill Zocor per pharmacy and Tribenzor per patient. Please advise.

## 2014-05-22 NOTE — Telephone Encounter (Signed)
Pamela Malone needs a refill of Tribenzor sent to [facility]

## 2014-05-23 NOTE — Telephone Encounter (Signed)
Requested Medications     Medication name:  Name from pharmacy:  simvastatin (ZOCOR) 20 MG tablet  SIMVASTATIN TAB     Sig: TAKE 1 TABLET AT BEDTIME    Dispense: 90 tablet Refills: 2 Start: 05/22/2014  Class: Normal    Non-formulary    Requested on: 02/27/2014    Originally ordered on: 02/03/2011 Last refill: 02/27/2014 Order History and Details           Olmesartan-Amlodipine-HCTZ (TRIBENZOR) 40-5-25 MG TABS    Sig: Take 1 tablet by mouth daily.    Dispense: 90 tablet Refills: 2 Start: 05/22/2014  Class: Normal    Non-formulary Diagnoses: Essential hypertension, benign    Originally ordered on: 06/15/2011 Order History and Details

## 2014-05-24 MED ORDER — OLMESARTAN-AMLODIPINE-HCTZ 40-5-25 MG PO TABS: 1.0000 | ORAL_TABLET | Freq: Every day | ORAL | Status: AC

## 2014-05-25 ENCOUNTER — Encounter: Payer: Self-pay | Admitting: *Deleted

## 2014-05-25 ENCOUNTER — Ambulatory Visit (INDEPENDENT_AMBULATORY_CARE_PROVIDER_SITE_OTHER): Payer: 59 | Admitting: Internal Medicine

## 2014-05-25 ENCOUNTER — Telehealth: Payer: Self-pay | Admitting: *Deleted

## 2014-05-25 DIAGNOSIS — Z23 Encounter for immunization: Secondary | ICD-10-CM

## 2014-05-25 NOTE — Telephone Encounter (Signed)
Error

## 2014-05-25 NOTE — Telephone Encounter (Signed)
error 

## 2014-05-25 NOTE — Progress Notes (Signed)
Subjective:     Patient ID: Pamela Malone, female   DOB: [birth year], 63 y.o.   MRN: [id number]  HPI   Review of Systems     Objective:   Physical Exam     Assessment:         Plan:          Pt here for Zoster vaccine

## 2014-07-16 ENCOUNTER — Telehealth: Payer: Self-pay

## 2014-07-16 NOTE — Telephone Encounter (Signed)
Trula SladeJudy Malone [phone]  Darel HongJudy would like for you to call her back, she has some questions about her medicine.

## 2014-07-16 NOTE — Telephone Encounter (Signed)
Darel HongJudy called and wanted to know if any of her current medications could cause her taste buds to change. She says that everything she eats or drinks just has no taste.

## 2014-07-17 NOTE — Telephone Encounter (Signed)
It would be rare that any of her meds causes this

## 2014-07-18 NOTE — Telephone Encounter (Signed)
I spoke with Pamela HongJudy in regards to her medications. I let her know we are happy to see her and evaluate her for her symptoms. She will call back and make an appointment-eh

## 2014-07-30 ENCOUNTER — Encounter: Payer: Self-pay | Admitting: Internal Medicine

## 2014-08-21 ENCOUNTER — Other Ambulatory Visit: Payer: Self-pay | Admitting: Internal Medicine

## 2014-08-21 DIAGNOSIS — Z1231 Encounter for screening mammogram for malignant neoplasm of breast: Secondary | ICD-10-CM

## 2014-09-03 ENCOUNTER — Ambulatory Visit (HOSPITAL_COMMUNITY)
Admission: RE | Admit: 2014-09-03 | Discharge: 2014-09-03 | Disposition: A | Discharge status: Home / Self Care | Payer: 59 | Source: Ambulatory Visit | Attending: Internal Medicine | Admitting: Internal Medicine

## 2014-09-03 DIAGNOSIS — Z1231 Encounter for screening mammogram for malignant neoplasm of breast: Secondary | ICD-10-CM | POA: Insufficient documentation

## 2015-01-17 ENCOUNTER — Other Ambulatory Visit: Payer: Self-pay | Admitting: Orthopedic Surgery

## 2015-01-18 ENCOUNTER — Encounter (HOSPITAL_COMMUNITY)
Admission: RE | Admit: 2015-01-18 | Discharge: 2015-01-18 | Disposition: A | Discharge status: Home / Self Care | Payer: 59 | Source: Ambulatory Visit | Attending: Orthopedic Surgery | Admitting: Orthopedic Surgery

## 2015-01-18 ENCOUNTER — Encounter (HOSPITAL_COMMUNITY): Payer: Self-pay

## 2015-01-18 ENCOUNTER — Ambulatory Visit (HOSPITAL_COMMUNITY)
Admission: RE | Admit: 2015-01-18 | Discharge: 2015-01-18 | Disposition: A | Discharge status: Home / Self Care | Payer: 59 | Source: Ambulatory Visit | Attending: Orthopedic Surgery | Admitting: Orthopedic Surgery

## 2015-01-18 DIAGNOSIS — Z01818 Encounter for other preprocedural examination: Secondary | ICD-10-CM

## 2015-01-18 HISTORY — DX: Nocturia: R35.1

## 2015-01-18 HISTORY — DX: Unspecified cataract: H26.9

## 2015-01-18 HISTORY — DX: Personal history of other diseases of the circulatory system: Z86.79

## 2015-01-18 LAB — CBC WITH DIFFERENTIAL/PLATELET
Basophils Absolute: 0 10*3/uL (ref 0.0–0.1)
Basophils Relative: 0 % (ref 0–1)
EOS ABS: 0.1 10*3/uL (ref 0.0–0.7)
EOS PCT: 1 % (ref 0–5)
HCT: 41.7 % (ref 36.0–46.0)
HEMOGLOBIN: 14.3 g/dL (ref 12.0–15.0)
LYMPHS PCT: 36 % (ref 12–46)
Lymphs Abs: 3.2 10*3/uL (ref 0.7–4.0)
MCH: 32.9 pg (ref 26.0–34.0)
MCHC: 34.3 g/dL (ref 30.0–36.0)
MCV: 95.9 fL (ref 78.0–100.0)
MONOS PCT: 8 % (ref 3–12)
Monocytes Absolute: 0.8 10*3/uL (ref 0.1–1.0)
NEUTROS PCT: 55 % (ref 43–77)
Neutro Abs: 5 10*3/uL (ref 1.7–7.7)
Platelets: 374 10*3/uL (ref 150–400)
RBC: 4.35 MIL/uL (ref 3.87–5.11)
RDW: 13 % (ref 11.5–15.5)
WBC: 9.1 10*3/uL (ref 4.0–10.5)

## 2015-01-18 LAB — SURGICAL PCR SCREEN
MRSA, PCR: NEGATIVE
Staphylococcus aureus: NEGATIVE

## 2015-01-18 LAB — COMPREHENSIVE METABOLIC PANEL
ALBUMIN: 4.6 g/dL (ref 3.5–5.2)
ALK PHOS: 51 U/L (ref 39–117)
ALT: 20 U/L (ref 0–35)
AST: 24 U/L (ref 0–37)
Anion gap: 10 (ref 5–15)
BILIRUBIN TOTAL: 0.9 mg/dL (ref 0.3–1.2)
BUN: 16 mg/dL (ref 6–23)
CHLORIDE: 98 mmol/L (ref 96–112)
CO2: 28 mmol/L (ref 19–32)
Calcium: 9.5 mg/dL (ref 8.4–10.5)
Creatinine, Ser: 1.07 mg/dL (ref 0.50–1.10)
GFR calc Af Amer: 63 mL/min — ABNORMAL LOW (ref >90)
GFR calc non Af Amer: 54 mL/min — ABNORMAL LOW (ref >90)
Glucose, Bld: 126 mg/dL — ABNORMAL HIGH (ref 70–99)
Potassium: 4.2 mmol/L (ref 3.5–5.1)
Sodium: 136 mmol/L (ref 135–145)
Total Protein: 6.8 g/dL (ref 6.0–8.3)

## 2015-01-18 LAB — URINALYSIS, ROUTINE W REFLEX MICROSCOPIC
Bilirubin Urine: NEGATIVE
GLUCOSE, UA: NEGATIVE mg/dL
Hgb urine dipstick: NEGATIVE
Ketones, ur: NEGATIVE mg/dL
Nitrite: NEGATIVE
PH: 5.5 (ref 5.0–8.0)
PROTEIN: NEGATIVE mg/dL
Specific Gravity, Urine: 1.019 (ref 1.005–1.030)
Urobilinogen, UA: 1 mg/dL (ref 0.0–1.0)

## 2015-01-18 LAB — PROTIME-INR
INR: 0.97 (ref 0.00–1.49)
Prothrombin Time: 13 seconds (ref 11.6–15.2)

## 2015-01-18 LAB — URINE MICROSCOPIC-ADD ON

## 2015-01-18 LAB — APTT: aPTT: 29 seconds (ref 24–37)

## 2015-01-18 NOTE — Progress Notes (Signed)
   01/18/15 1342  OBSTRUCTIVE SLEEP APNEA  Have you ever been diagnosed with sleep apnea through a sleep study? No  Do you snore loudly (loud enough to be heard through closed doors)?  0  Do you often feel tired, fatigued, or sleepy during the daytime? 0  Has anyone observed you stop breathing during your sleep? 0  Do you have, or are you being treated for high blood pressure? 1  BMI more than 35 kg/m2? 0  Age over 63 years old? 1  Neck circumference greater than 40 cm/16 inches? 0  Gender: 0

## 2015-01-18 NOTE — Pre-Procedure Instructions (Signed)
Pamela DeerJudy M Malone  01/18/2015   Your procedure is scheduled on:  Monday, May 2  [facility] [phone] AM.  Call this number if you have problems the morning of surgery: [phone]   Remember:   Do not eat food or drink liquids after midnight. Sunday night   Take these medicines the morning of surgery with A SIP OF WATER: omeprazole   Do not wear jewelry, make-up or nail polish.  Do not wear lotions, powders, or perfumes. You may not wear deodorant.  Do not shave 48 hours prior to surgery. Men may shave face and neck.  Do not bring valuables to the hospital.  [facility] is not responsible  for any belongings or valuables.               Contacts, dentures or bridgework may not be worn into surgery.  Leave suitcase in the car. After surgery it may be brought to your room.  For patients admitted to the hospital, discharge time is determined by your  treatment team.                 Special Instructions:  Follow shower instructions  On fact sheet "Preparing for Surgery"   Please read over the following fact sheets that you were given: Pain Booklet, Coughing and Deep Breathing, Total Joint Packet and Surgical Site Infection Prevention

## 2015-01-20 LAB — URINE CULTURE: Colony Count: 70000

## 2015-01-21 NOTE — Progress Notes (Addendum)
Anesthesia Chart Review:  Pt is 64 year old female scheduled for L total knee arthroplasty on 01/28/2015 with Dr. Sherlean FootLucey.   Cardiologist is Dr. Donnie Ahoilley.   PMH includes: HTN, hyperlipidemia, RBBB, GERD, scoliosis. Never smoker. BMI 33.   Preoperative labs reviewed.    Chest x-ray reviewed.  1. Thoracic scoliosis 2. No active cardiopulmonary abnormalities  EKG: NSR. RBBB. RBBB is labeled new on EKG per cardiologist's interpretation, but was present on EKG in Epic dated 12/14/2011.   Awaiting records from Dr. York Spanielilley's office.   Rica Mastngela Kabbe, FNP-BC [facility]/Anesthesiology Phone: [phone] 01/21/2015 5:00 PM  Addendum:  Received office visit not from Dr. York Spanielilley's office dated 01/07/2015. This note indicates pt had an echo 12/10/14 that showed mild concentric LVH with evidence of grade 1 diastolic dysfunction, LVEF 65%. The note also reports pt wore a holter monitor 11/2014 that showed isolated PACs but no other sustained arrhythmia.  Dr. Donnie Ahoilley notes no further work up for RBBB or PACs needed and he will see her back on a prn basis.   If no changes, I anticipate pt can proceed with surgery as scheduled.   Rica Mastngela Kabbe, FNP-BC [facility]Anesthesiology Phone: [phone] 01/23/2015 3:06 PM

## 2015-01-25 DIAGNOSIS — M1712 Unilateral primary osteoarthritis, left knee: Secondary | ICD-10-CM | POA: Insufficient documentation

## 2015-01-25 DIAGNOSIS — M1710 Unilateral primary osteoarthritis, unspecified knee: Secondary | ICD-10-CM | POA: Insufficient documentation

## 2015-01-25 MED ORDER — BUPIVACAINE LIPOSOME 1.3 % IJ SUSP: 20.0000 mL | Freq: Once | INTRAMUSCULAR | Status: DC

## 2015-01-25 MED ORDER — TRANEXAMIC ACID 1000 MG/10ML IV SOLN
1000.0000 mg | INTRAVENOUS | Status: AC
  Administered 2015-01-28: 1000 mg via INTRAVENOUS
  Filled 2015-01-25: qty 10

## 2015-01-28 ENCOUNTER — Inpatient Hospital Stay (HOSPITAL_COMMUNITY): Payer: 59 | Admitting: Emergency Medicine

## 2015-01-28 ENCOUNTER — Inpatient Hospital Stay (HOSPITAL_COMMUNITY)
Admission: RE | Admit: 2015-01-28 | Discharge: 2015-01-30 | DRG: 470 | Disposition: A | Discharge status: Home / Self Care | Payer: 59 | Source: Ambulatory Visit | Attending: Orthopedic Surgery | Admitting: Orthopedic Surgery

## 2015-01-28 ENCOUNTER — Encounter (HOSPITAL_COMMUNITY): Payer: Self-pay | Admitting: *Deleted

## 2015-01-28 ENCOUNTER — Encounter (HOSPITAL_COMMUNITY)
Admission: RE | Disposition: A | Discharge status: Home / Self Care | Payer: Self-pay | Source: Ambulatory Visit | Attending: Orthopedic Surgery

## 2015-01-28 ENCOUNTER — Inpatient Hospital Stay (HOSPITAL_COMMUNITY): Payer: 59 | Admitting: Certified Registered"

## 2015-01-28 DIAGNOSIS — D62 Acute posthemorrhagic anemia: Secondary | ICD-10-CM | POA: Diagnosis not present

## 2015-01-28 DIAGNOSIS — K219 Gastro-esophageal reflux disease without esophagitis: Secondary | ICD-10-CM | POA: Diagnosis present

## 2015-01-28 DIAGNOSIS — M545 Low back pain: Secondary | ICD-10-CM | POA: Diagnosis present

## 2015-01-28 DIAGNOSIS — L409 Psoriasis, unspecified: Secondary | ICD-10-CM | POA: Diagnosis present

## 2015-01-28 DIAGNOSIS — E785 Hyperlipidemia, unspecified: Secondary | ICD-10-CM | POA: Diagnosis present

## 2015-01-28 DIAGNOSIS — Z6833 Body mass index (BMI) 33.0-33.9, adult: Secondary | ICD-10-CM | POA: Diagnosis not present

## 2015-01-28 DIAGNOSIS — F329 Major depressive disorder, single episode, unspecified: Secondary | ICD-10-CM | POA: Diagnosis present

## 2015-01-28 DIAGNOSIS — M1712 Unilateral primary osteoarthritis, left knee: Secondary | ICD-10-CM | POA: Diagnosis present

## 2015-01-28 DIAGNOSIS — M25562 Pain in left knee: Secondary | ICD-10-CM | POA: Diagnosis present

## 2015-01-28 DIAGNOSIS — M419 Scoliosis, unspecified: Secondary | ICD-10-CM | POA: Diagnosis present

## 2015-01-28 DIAGNOSIS — I10 Essential (primary) hypertension: Secondary | ICD-10-CM | POA: Diagnosis present

## 2015-01-28 DIAGNOSIS — Z96659 Presence of unspecified artificial knee joint: Secondary | ICD-10-CM

## 2015-01-28 HISTORY — PX: TOTAL KNEE ARTHROPLASTY: SHX125

## 2015-01-28 LAB — CREATININE, SERUM
Creatinine, Ser: 0.86 mg/dL (ref 0.44–1.00)
GFR calc Af Amer: >60 mL/min (ref >60)

## 2015-01-28 LAB — CBC
HCT: 35.4 % — ABNORMAL LOW (ref 36.0–46.0)
Hemoglobin: 12 g/dL (ref 12.0–15.0)
MCH: 32.6 pg (ref 26.0–34.0)
MCHC: 33.9 g/dL (ref 30.0–36.0)
MCV: 96.2 fL (ref 78.0–100.0)
PLATELETS: 317 10*3/uL (ref 150–400)
RBC: 3.68 MIL/uL — ABNORMAL LOW (ref 3.87–5.11)
RDW: 13 % (ref 11.5–15.5)
WBC: 15.7 10*3/uL — ABNORMAL HIGH (ref 4.0–10.5)

## 2015-01-28 SURGERY — ARTHROPLASTY, KNEE, TOTAL
Anesthesia: General | Laterality: Left

## 2015-01-28 MED ORDER — HYDROMORPHONE HCL 1 MG/ML IJ SOLN
0.2500 mg | INTRAMUSCULAR | Status: DC | PRN
  Administered 2015-01-28 (×4): 0.5 mg via INTRAVENOUS

## 2015-01-28 MED ORDER — HYDROMORPHONE HCL 1 MG/ML IJ SOLN
1.0000 mg | INTRAMUSCULAR | Status: DC | PRN
  Administered 2015-01-28 – 2015-01-29 (×8): 1 mg via INTRAVENOUS
  Filled 2015-01-28 (×8): qty 1

## 2015-01-28 MED ORDER — LIDOCAINE HCL (CARDIAC) 20 MG/ML IV SOLN
INTRAVENOUS | Status: DC | PRN
  Administered 2015-01-28: 100 mg via INTRAVENOUS

## 2015-01-28 MED ORDER — PROPOFOL 10 MG/ML IV BOLUS
INTRAVENOUS | Status: DC | PRN
  Administered 2015-01-28: 50 mg via INTRAVENOUS
  Administered 2015-01-28: 200 mg via INTRAVENOUS
  Administered 2015-01-28: 100 mg via INTRAVENOUS

## 2015-01-28 MED ORDER — CELECOXIB 200 MG PO CAPS
200.0000 mg | ORAL_CAPSULE | Freq: Two times a day (BID) | ORAL | Status: DC
  Administered 2015-01-28 – 2015-01-30 (×4): 200 mg via ORAL
  Filled 2015-01-28 (×4): qty 1

## 2015-01-28 MED ORDER — ACETAMINOPHEN 325 MG PO TABS: 650.0000 mg | ORAL_TABLET | Freq: Four times a day (QID) | ORAL | Status: DC | PRN

## 2015-01-28 MED ORDER — SODIUM CHLORIDE 0.9 % IV SOLN
INTRAVENOUS | Status: DC
  Administered 2015-01-29: 1000 mL via INTRAVENOUS

## 2015-01-28 MED ORDER — AMLODIPINE BESYLATE 5 MG PO TABS
5.0000 mg | ORAL_TABLET | Freq: Every day | ORAL | Status: DC
  Administered 2015-01-29 – 2015-01-30 (×2): 5 mg via ORAL
  Filled 2015-01-28 (×2): qty 1

## 2015-01-28 MED ORDER — PHENOL 1.4 % MT LIQD: 1.0000 | OROMUCOSAL | Status: DC | PRN

## 2015-01-28 MED ORDER — OXYCODONE HCL ER 10 MG PO T12A
10.0000 mg | EXTENDED_RELEASE_TABLET | Freq: Two times a day (BID) | ORAL | Status: DC
  Administered 2015-01-28 – 2015-01-30 (×4): 10 mg via ORAL
  Filled 2015-01-28 (×4): qty 1

## 2015-01-28 MED ORDER — SODIUM CHLORIDE 0.9 % IJ SOLN
INTRAMUSCULAR | Status: AC
  Filled 2015-01-28: qty 10

## 2015-01-28 MED ORDER — HYDROMORPHONE HCL 1 MG/ML IJ SOLN
INTRAMUSCULAR | Status: AC
  Filled 2015-01-28: qty 1

## 2015-01-28 MED ORDER — BUPIVACAINE-EPINEPHRINE (PF) 0.5% -1:200000 IJ SOLN
INTRAMUSCULAR | Status: AC
  Filled 2015-01-28: qty 30

## 2015-01-28 MED ORDER — PROPOFOL 10 MG/ML IV BOLUS
INTRAVENOUS | Status: AC
  Filled 2015-01-28: qty 20

## 2015-01-28 MED ORDER — PANTOPRAZOLE SODIUM 40 MG PO TBEC
40.0000 mg | DELAYED_RELEASE_TABLET | Freq: Every day | ORAL | Status: DC
  Administered 2015-01-29 – 2015-01-30 (×2): 40 mg via ORAL
  Filled 2015-01-28 (×2): qty 1

## 2015-01-28 MED ORDER — FLEET ENEMA 7-19 GM/118ML RE ENEM: 1.0000 | ENEMA | Freq: Once | RECTAL | Status: AC | PRN

## 2015-01-28 MED ORDER — SENNOSIDES-DOCUSATE SODIUM 8.6-50 MG PO TABS: 1.0000 | ORAL_TABLET | Freq: Every evening | ORAL | Status: DC | PRN

## 2015-01-28 MED ORDER — ONDANSETRON HCL 4 MG PO TABS: 4.0000 mg | ORAL_TABLET | Freq: Four times a day (QID) | ORAL | Status: DC | PRN

## 2015-01-28 MED ORDER — BUPIVACAINE LIPOSOME 1.3 % IJ SUSP
INTRAMUSCULAR | Status: DC | PRN
  Administered 2015-01-28: 20 mL

## 2015-01-28 MED ORDER — METHOCARBAMOL 1000 MG/10ML IJ SOLN
500.0000 mg | Freq: Four times a day (QID) | INTRAVENOUS | Status: DC | PRN
  Administered 2015-01-28: 500 mg via INTRAVENOUS
  Filled 2015-01-28 (×2): qty 5

## 2015-01-28 MED ORDER — BUPIVACAINE LIPOSOME 1.3 % IJ SUSP
20.0000 mL | INTRAMUSCULAR | Status: DC
  Filled 2015-01-28: qty 20

## 2015-01-28 MED ORDER — MIDAZOLAM HCL 2 MG/2ML IJ SOLN
INTRAMUSCULAR | Status: AC
  Administered 2015-01-28: 1 mg via INTRAVENOUS
  Filled 2015-01-28: qty 2

## 2015-01-28 MED ORDER — ENOXAPARIN SODIUM 30 MG/0.3ML ~~LOC~~ SOLN
30.0000 mg | Freq: Two times a day (BID) | SUBCUTANEOUS | Status: DC
  Administered 2015-01-29 – 2015-01-30 (×3): 30 mg via SUBCUTANEOUS
  Filled 2015-01-28 (×3): qty 0.3

## 2015-01-28 MED ORDER — MIDAZOLAM HCL 2 MG/2ML IJ SOLN
INTRAMUSCULAR | Status: AC
  Filled 2015-01-28: qty 2

## 2015-01-28 MED ORDER — CHLORHEXIDINE GLUCONATE 4 % EX LIQD
60.0000 mL | Freq: Once | CUTANEOUS | Status: DC
  Filled 2015-01-28: qty 60

## 2015-01-28 MED ORDER — METOCLOPRAMIDE HCL 5 MG PO TABS: 5.0000 mg | ORAL_TABLET | Freq: Three times a day (TID) | ORAL | Status: DC | PRN

## 2015-01-28 MED ORDER — ONDANSETRON HCL 4 MG/2ML IJ SOLN
INTRAMUSCULAR | Status: AC
  Filled 2015-01-28: qty 2

## 2015-01-28 MED ORDER — MIDAZOLAM HCL 5 MG/5ML IJ SOLN
INTRAMUSCULAR | Status: DC | PRN
  Administered 2015-01-28: 2 mg via INTRAVENOUS

## 2015-01-28 MED ORDER — LORATADINE 10 MG PO TABS
10.0000 mg | ORAL_TABLET | Freq: Every day | ORAL | Status: DC
  Administered 2015-01-29 – 2015-01-30 (×2): 10 mg via ORAL
  Filled 2015-01-28 (×2): qty 1

## 2015-01-28 MED ORDER — PROMETHAZINE HCL 25 MG/ML IJ SOLN: 6.2500 mg | INTRAMUSCULAR | Status: DC | PRN

## 2015-01-28 MED ORDER — IRBESARTAN 300 MG PO TABS
300.0000 mg | ORAL_TABLET | Freq: Every day | ORAL | Status: DC
  Administered 2015-01-29 – 2015-01-30 (×2): 300 mg via ORAL
  Filled 2015-01-28 (×2): qty 1

## 2015-01-28 MED ORDER — OXYCODONE HCL 5 MG PO TABS
ORAL_TABLET | ORAL | Status: AC
  Filled 2015-01-28: qty 2

## 2015-01-28 MED ORDER — BUPIVACAINE-EPINEPHRINE (PF) 0.5% -1:200000 IJ SOLN
INTRAMUSCULAR | Status: DC | PRN
  Administered 2015-01-28: 20 mL via PERINEURAL

## 2015-01-28 MED ORDER — OLMESARTAN-AMLODIPINE-HCTZ 40-5-25 MG PO TABS: 1.0000 | ORAL_TABLET | Freq: Every day | ORAL | Status: DC

## 2015-01-28 MED ORDER — BISACODYL 5 MG PO TBEC: 5.0000 mg | DELAYED_RELEASE_TABLET | Freq: Every day | ORAL | Status: DC | PRN

## 2015-01-28 MED ORDER — ZOLPIDEM TARTRATE 5 MG PO TABS: 5.0000 mg | ORAL_TABLET | Freq: Every evening | ORAL | Status: DC | PRN

## 2015-01-28 MED ORDER — BUPIVACAINE-EPINEPHRINE 0.5% -1:200000 IJ SOLN
INTRAMUSCULAR | Status: DC | PRN
  Administered 2015-01-28: 20 mL

## 2015-01-28 MED ORDER — FENTANYL CITRATE (PF) 100 MCG/2ML IJ SOLN
100.0000 ug | Freq: Once | INTRAMUSCULAR | Status: AC
  Administered 2015-01-28: 100 ug via INTRAVENOUS

## 2015-01-28 MED ORDER — ACETAMINOPHEN 650 MG RE SUPP: 650.0000 mg | Freq: Four times a day (QID) | RECTAL | Status: DC | PRN

## 2015-01-28 MED ORDER — ONDANSETRON HCL 4 MG/2ML IJ SOLN
4.0000 mg | Freq: Four times a day (QID) | INTRAMUSCULAR | Status: DC | PRN
  Administered 2015-01-28 (×2): 4 mg via INTRAVENOUS
  Filled 2015-01-28 (×2): qty 2

## 2015-01-28 MED ORDER — SUCCINYLCHOLINE CHLORIDE 20 MG/ML IJ SOLN
INTRAMUSCULAR | Status: AC
  Filled 2015-01-28: qty 1

## 2015-01-28 MED ORDER — CEFAZOLIN SODIUM-DEXTROSE 2-3 GM-% IV SOLR
INTRAVENOUS | Status: AC
  Filled 2015-01-28: qty 50

## 2015-01-28 MED ORDER — FENTANYL CITRATE (PF) 100 MCG/2ML IJ SOLN
INTRAMUSCULAR | Status: DC | PRN
  Administered 2015-01-28 (×6): 25 ug via INTRAVENOUS

## 2015-01-28 MED ORDER — DOCUSATE SODIUM 100 MG PO CAPS
100.0000 mg | ORAL_CAPSULE | Freq: Two times a day (BID) | ORAL | Status: DC
  Administered 2015-01-28 – 2015-01-30 (×4): 100 mg via ORAL
  Filled 2015-01-28 (×4): qty 1

## 2015-01-28 MED ORDER — MENTHOL 3 MG MT LOZG: 1.0000 | LOZENGE | OROMUCOSAL | Status: DC | PRN

## 2015-01-28 MED ORDER — EPHEDRINE SULFATE 50 MG/ML IJ SOLN
INTRAMUSCULAR | Status: AC
  Filled 2015-01-28: qty 1

## 2015-01-28 MED ORDER — METOCLOPRAMIDE HCL 5 MG/ML IJ SOLN
5.0000 mg | Freq: Three times a day (TID) | INTRAMUSCULAR | Status: DC | PRN
  Administered 2015-01-28: 10 mg via INTRAVENOUS
  Filled 2015-01-28: qty 2

## 2015-01-28 MED ORDER — CEFAZOLIN SODIUM-DEXTROSE 2-3 GM-% IV SOLR
2.0000 g | Freq: Four times a day (QID) | INTRAVENOUS | Status: AC
  Administered 2015-01-28 (×2): 2 g via INTRAVENOUS
  Filled 2015-01-28 (×2): qty 50

## 2015-01-28 MED ORDER — LACTATED RINGERS IV SOLN
INTRAVENOUS | Status: DC | PRN
  Administered 2015-01-28 (×2): via INTRAVENOUS

## 2015-01-28 MED ORDER — SODIUM CHLORIDE 0.9 % IV SOLN
INTRAVENOUS | Status: DC
  Administered 2015-01-28: 1000 mL via INTRAVENOUS

## 2015-01-28 MED ORDER — LACTATED RINGERS IV SOLN
Freq: Once | INTRAVENOUS | Status: AC
  Administered 2015-01-28: 07:00:00 via INTRAVENOUS

## 2015-01-28 MED ORDER — PHENYLEPHRINE 40 MCG/ML (10ML) SYRINGE FOR IV PUSH (FOR BLOOD PRESSURE SUPPORT)
PREFILLED_SYRINGE | INTRAVENOUS | Status: AC
  Filled 2015-01-28: qty 10

## 2015-01-28 MED ORDER — CEFAZOLIN SODIUM-DEXTROSE 2-3 GM-% IV SOLR
2.0000 g | INTRAVENOUS | Status: AC
  Administered 2015-01-28: 2 g via INTRAVENOUS

## 2015-01-28 MED ORDER — HYDROCHLOROTHIAZIDE 25 MG PO TABS
25.0000 mg | ORAL_TABLET | Freq: Every day | ORAL | Status: DC
  Administered 2015-01-29 – 2015-01-30 (×2): 25 mg via ORAL
  Filled 2015-01-28 (×2): qty 1

## 2015-01-28 MED ORDER — SODIUM CHLORIDE 0.9 % IR SOLN
Status: DC | PRN
  Administered 2015-01-28: 1000 mL

## 2015-01-28 MED ORDER — SIMVASTATIN 20 MG PO TABS
20.0000 mg | ORAL_TABLET | Freq: Every day | ORAL | Status: DC
  Administered 2015-01-28 – 2015-01-29 (×2): 20 mg via ORAL
  Filled 2015-01-28 (×2): qty 1

## 2015-01-28 MED ORDER — METHOCARBAMOL 500 MG PO TABS
500.0000 mg | ORAL_TABLET | Freq: Four times a day (QID) | ORAL | Status: DC | PRN
Start: 2015-01-28 — End: 2015-01-30
  Administered 2015-01-28 – 2015-01-29 (×2): 500 mg via ORAL
  Filled 2015-01-28 (×4): qty 1

## 2015-01-28 MED ORDER — PHENYLEPHRINE HCL 10 MG/ML IJ SOLN
10.0000 mg | INTRAVENOUS | Status: DC | PRN
  Administered 2015-01-28: 20 ug/min via INTRAVENOUS

## 2015-01-28 MED ORDER — MIDAZOLAM HCL 2 MG/2ML IJ SOLN
1.0000 mg | Freq: Once | INTRAMUSCULAR | Status: AC
  Administered 2015-01-28: 1 mg via INTRAVENOUS

## 2015-01-28 MED ORDER — DIPHENHYDRAMINE HCL 12.5 MG/5ML PO ELIX: 12.5000 mg | ORAL_SOLUTION | ORAL | Status: DC | PRN

## 2015-01-28 MED ORDER — PHENYLEPHRINE HCL 10 MG/ML IJ SOLN
INTRAMUSCULAR | Status: DC | PRN
  Administered 2015-01-28: 120 ug via INTRAVENOUS
  Administered 2015-01-28 (×2): 80 ug via INTRAVENOUS

## 2015-01-28 MED ORDER — FENTANYL CITRATE (PF) 250 MCG/5ML IJ SOLN
INTRAMUSCULAR | Status: AC
  Filled 2015-01-28: qty 5

## 2015-01-28 MED ORDER — NEOSTIGMINE METHYLSULFATE 10 MG/10ML IV SOLN
INTRAVENOUS | Status: AC
  Filled 2015-01-28: qty 3

## 2015-01-28 MED ORDER — OXYCODONE HCL 5 MG PO TABS
5.0000 mg | ORAL_TABLET | ORAL | Status: DC | PRN
  Administered 2015-01-28 – 2015-01-30 (×10): 10 mg via ORAL
  Filled 2015-01-28 (×9): qty 2

## 2015-01-28 MED ORDER — ALUM & MAG HYDROXIDE-SIMETH 200-200-20 MG/5ML PO SUSP: 30.0000 mL | ORAL | Status: DC | PRN

## 2015-01-28 MED ORDER — FENTANYL CITRATE (PF) 100 MCG/2ML IJ SOLN
INTRAMUSCULAR | Status: AC
  Administered 2015-01-28: 100 ug via INTRAVENOUS
  Filled 2015-01-28: qty 2

## 2015-01-28 MED ORDER — LIDOCAINE HCL (CARDIAC) 20 MG/ML IV SOLN
INTRAVENOUS | Status: AC
  Filled 2015-01-28: qty 5

## 2015-01-28 MED ORDER — ROCURONIUM BROMIDE 50 MG/5ML IV SOLN
INTRAVENOUS | Status: AC
  Filled 2015-01-28: qty 1

## 2015-01-28 MED ORDER — TRANEXAMIC ACID 1000 MG/10ML IV SOLN: 1000.0000 mg | Freq: Once | INTRAVENOUS | Status: DC

## 2015-01-28 MED ORDER — ONDANSETRON HCL 4 MG/2ML IJ SOLN
INTRAMUSCULAR | Status: DC | PRN
Start: 2015-01-28 — End: 2015-01-28
  Administered 2015-01-28: 4 mg via INTRAVENOUS

## 2015-01-28 MED ORDER — 0.9 % SODIUM CHLORIDE (POUR BTL) OPTIME
TOPICAL | Status: DC | PRN
  Administered 2015-01-28: 1000 mL

## 2015-01-28 SURGICAL SUPPLY — 61 items
BANDAGE ESMARK 6X9 LF (GAUZE/BANDAGES/DRESSINGS) ×1 IMPLANT
BLADE SAGITTAL 13X1.27X60 (BLADE) ×2 IMPLANT
BLADE SAGITTAL 13X1.27X60MM (BLADE) ×1
BLADE SAW SGTL 83.5X18.5 (BLADE) ×3 IMPLANT
BLADE SURG 10 STRL SS (BLADE) ×3 IMPLANT
BNDG CMPR 9X6 STRL LF SNTH (GAUZE/BANDAGES/DRESSINGS) ×1
BNDG ESMARK 6X9 LF (GAUZE/BANDAGES/DRESSINGS) ×3
BOWL SMART MIX CTS (DISPOSABLE) ×3 IMPLANT
CAPT KNEE TOTAL 3 ×3 IMPLANT
CEMENT BONE SIMPLEX SPEEDSET (Cement) ×6 IMPLANT
COVER SURGICAL LIGHT HANDLE (MISCELLANEOUS) ×3 IMPLANT
CUFF TOURNIQUET SINGLE 34IN LL (TOURNIQUET CUFF) ×3 IMPLANT
DRAPE EXTREMITY T 121X128X90 (DRAPE) ×3 IMPLANT
DRAPE IMP U-DRAPE 54X76 (DRAPES) ×3 IMPLANT
DRAPE INCISE IOBAN 66X45 STRL (DRAPES) ×8 IMPLANT
DRAPE PROXIMA HALF (DRAPES) IMPLANT
DRAPE U-SHAPE 47X51 STRL (DRAPES) ×3 IMPLANT
DRSG ADAPTIC 3X8 NADH LF (GAUZE/BANDAGES/DRESSINGS) ×3 IMPLANT
DRSG PAD ABDOMINAL 8X10 ST (GAUZE/BANDAGES/DRESSINGS) ×3 IMPLANT
DURAPREP 26ML APPLICATOR (WOUND CARE) ×6 IMPLANT
ELECT REM PT RETURN 9FT ADLT (ELECTROSURGICAL) ×3
ELECTRODE REM PT RTRN 9FT ADLT (ELECTROSURGICAL) ×1 IMPLANT
GAUZE SPONGE 4X4 12PLY STRL (GAUZE/BANDAGES/DRESSINGS) ×3 IMPLANT
GLOVE BIOGEL M 7.0 STRL (GLOVE) IMPLANT
GLOVE BIOGEL PI IND STRL 6.5 (GLOVE) IMPLANT
GLOVE BIOGEL PI IND STRL 7.5 (GLOVE) IMPLANT
GLOVE BIOGEL PI IND STRL 8.5 (GLOVE) ×2 IMPLANT
GLOVE BIOGEL PI INDICATOR 6.5 (GLOVE) ×2
GLOVE BIOGEL PI INDICATOR 7.5 (GLOVE)
GLOVE BIOGEL PI INDICATOR 8.5 (GLOVE) ×2
GLOVE ECLIPSE 6.5 STRL STRAW (GLOVE) ×6 IMPLANT
GLOVE SURG ORTHO 8.0 STRL STRW (GLOVE) ×6 IMPLANT
GOWN STRL REUS W/ TWL LRG LVL3 (GOWN DISPOSABLE) ×1 IMPLANT
GOWN STRL REUS W/ TWL XL LVL3 (GOWN DISPOSABLE) ×2 IMPLANT
GOWN STRL REUS W/TWL LRG LVL3 (GOWN DISPOSABLE) ×6
GOWN STRL REUS W/TWL XL LVL3 (GOWN DISPOSABLE) ×6
HANDPIECE INTERPULSE COAX TIP (DISPOSABLE) ×3
HOOD PEEL AWAY FACE SHEILD DIS (HOOD) ×9 IMPLANT
KIT BASIN OR (CUSTOM PROCEDURE TRAY) ×3 IMPLANT
KIT ROOM TURNOVER OR (KITS) ×3 IMPLANT
KNEE CAPITATED TOTAL 3 ×1 IMPLANT
MANIFOLD NEPTUNE II (INSTRUMENTS) ×3 IMPLANT
NEEDLE 22X1 1/2 (OR ONLY) (NEEDLE) ×6 IMPLANT
NS IRRIG 1000ML POUR BTL (IV SOLUTION) ×3 IMPLANT
PACK TOTAL JOINT (CUSTOM PROCEDURE TRAY) ×3 IMPLANT
PACK UNIVERSAL I (CUSTOM PROCEDURE TRAY) ×3 IMPLANT
PAD ARMBOARD 7.5X6 YLW CONV (MISCELLANEOUS) ×6 IMPLANT
PADDING CAST COTTON 6X4 STRL (CAST SUPPLIES) ×3 IMPLANT
SET HNDPC FAN SPRY TIP SCT (DISPOSABLE) ×1 IMPLANT
STAPLER VISISTAT 35W (STAPLE) ×3 IMPLANT
SUCTION FRAZIER TIP 10 FR DISP (SUCTIONS) ×3 IMPLANT
SUT BONE WAX W31G (SUTURE) ×3 IMPLANT
SUT VIC AB 0 CTB1 27 (SUTURE) ×6 IMPLANT
SUT VIC AB 1 CT1 27 (SUTURE) ×6
SUT VIC AB 1 CT1 27XBRD ANBCTR (SUTURE) ×2 IMPLANT
SUT VIC AB 2-0 CT1 27 (SUTURE) ×6
SUT VIC AB 2-0 CT1 TAPERPNT 27 (SUTURE) ×2 IMPLANT
SYR 20CC LL (SYRINGE) ×6 IMPLANT
TOWEL OR 17X24 6PK STRL BLUE (TOWEL DISPOSABLE) ×3 IMPLANT
TOWEL OR 17X26 10 PK STRL BLUE (TOWEL DISPOSABLE) ×3 IMPLANT
WATER STERILE IRR 1000ML POUR (IV SOLUTION) ×3 IMPLANT

## 2015-01-28 NOTE — Anesthesia Procedure Notes (Addendum)
Anesthesia Regional Block:  Adductor canal block  Pre-Anesthetic Checklist: ,, timeout performed, Correct Patient, Correct Site, Correct Laterality, Correct Procedure, Correct Position, site marked, Risks and benefits discussed,  Surgical consent,  Pre-op evaluation,  At surgeon's request and post-op pain management  Laterality: Left and Lower  Prep: chloraprep       Needles:   Needle Type: Echogenic Needle     Needle Length: 9cm 9 cm Needle Gauge: 21 and 21 G  Needle insertion depth: 5 cm   Additional Needles:  Procedures: ultrasound guided (picture in chart) Adductor canal block Narrative:  Start time: 01/28/2015 8:10 AM End time: 01/28/2015 8:25 AM Injection made incrementally with aspirations every 5 mL.  Performed by: Personally  Anesthesiologist: MASSAGEE, TERRY   Procedure Name: LMA Insertion Date/Time: 01/28/2015 8:56 AM Performed by: Jerilee HohMUMM, Jery Hollern N Pre-anesthesia Checklist: Patient identified, Suction available, Emergency Drugs available and Patient being monitored Patient Re-evaluated:Patient Re-evaluated prior to inductionOxygen Delivery Method: Circle system utilized Preoxygenation: Pre-oxygenation with 100% oxygen Intubation Type: IV induction Ventilation: Mask ventilation without difficulty LMA: LMA inserted LMA Size: 4.0 Tube type: Oral Number of attempts: 1 Placement Confirmation: positive ETCO2 and breath sounds checked- equal and bilateral Tube secured with: Tape Dental Injury: Teeth and Oropharynx as per pre-operative assessment

## 2015-01-28 NOTE — Progress Notes (Signed)
Orthopedic Tech Progress Note Patient Details:  Pamela DeerJudy M Malone [birth year] [id number]  CPM Left Knee CPM Left Knee: On Left Knee Flexion (Degrees): 90 Left Knee Extension (Degrees): 0 Additional Comments: trapeze bar and foot roll   Shawnie PonsCammer, Johnesha Acheampong Carol 01/28/2015, 11:24 AM

## 2015-01-28 NOTE — Anesthesia Postprocedure Evaluation (Signed)
  Anesthesia Post-op Note  Patient: Pamela Malone  Procedure(s) Performed: Procedure(s): TOTAL KNEE ARTHROPLASTY (Left)  Patient Location: PACU  Anesthesia Type:GA combined with regional for post-op pain  Level of Consciousness: awake and alert   Airway and Oxygen Therapy: Patient Spontanous Breathing  Post-op Pain: mild  Post-op Assessment: Post-op Vital signs reviewed  Post-op Vital Signs: stable  Last Vitals:  Filed Vitals:   01/28/15 1130  BP: 107/70  Pulse: 73  Temp:   Resp: 18    Complications: No apparent anesthesia complications

## 2015-01-28 NOTE — Anesthesia Preprocedure Evaluation (Addendum)
Anesthesia Evaluation  Patient identified by MRN, date of birth, ID band Patient awake    History of Anesthesia Complications Negative for: history of anesthetic complications  Airway Mallampati: II       Dental  (+) Teeth Intact   Pulmonary neg pulmonary ROS,  breath sounds clear to auscultation        Cardiovascular hypertension, Rhythm:Regular Rate:Normal     Neuro/Psych    GI/Hepatic GERD-  ,  Endo/Other  Morbid obesity  Renal/GU      Musculoskeletal  (+) Arthritis -, Scoliosis and low back pain   Abdominal (+) + obese,   Peds  Hematology   Anesthesia Other Findings   Reproductive/Obstetrics                            Anesthesia Physical Anesthesia Plan  ASA: II  Anesthesia Plan: General   Post-op Pain Management:    Induction: Intravenous  Airway Management Planned: Oral ETT  Additional Equipment:   Intra-op Plan:   Post-operative Plan: Extubation in OR  Informed Consent: I have reviewed the patients History and Physical, chart, labs and discussed the procedure including the risks, benefits and alternatives for the proposed anesthesia with the patient or authorized representative who has indicated his/her understanding and acceptance.   Dental advisory given  Plan Discussed with: CRNA and Surgeon  Anesthesia Plan Comments:         Anesthesia Quick Evaluation

## 2015-01-28 NOTE — H&P (Signed)
Pamela Malone MRN:  [id number] DOB/SEX:  1951-06-18/female  CHIEF COMPLAINT:  Painful left Knee  HISTORY: Patient is a 64 y.o. female presented with a history of pain in the left knee. Onset of symptoms was gradual starting several years ago with gradually worsening course since that time. Prior procedures on the knee include none. Patient has been treated conservatively with over-the-counter NSAIDs and activity modification. Patient currently rates pain in the knee at 10 out of 10 with activity. There is pain at night.  PAST MEDICAL HISTORY: Patient Active Problem List   Diagnosis Date Noted  . History of hyperglycemia 02/20/2013  . 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 Shingles 02/23/2012  . 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 DJD (degenerative joint disease)   . Scoliosis   . Low back pain   . Depression   . GERD (gastroesophageal reflux disease)   . Hyperlipidemia   . Hypertension   . Menopause   . Psoriasis    Past Medical History  Diagnosis Date  . Scoliosis   . Menopause   . Psoriasis   . Low back pain   . DJD (degenerative joint disease)   . GERD (gastroesophageal reflux disease)   . Hyperlipidemia   . Hypertension   . History of right bundle branch block (RBBB)     released after cardiac evaluation by Dr Donnie Ahoilley 12/2014; gave surgical clearance  . Depression     many years ago  . Nocturia   . Cataract     immature   Past Surgical History  Procedure Laterality Date  . Carpal tunnel release  bilateral  . Dilation and curettage of uterus  2009  . Ankle surgery Right     from a previous injury as a child      MEDICATIONS:   Prescriptions prior to admission  Medication Sig Dispense Refill Last Dose  . Adalimumab (HUMIRA PEN [place]) Inject 40 mg into the skin every 14 (fourteen) days.      . cetirizine (ZYRTEC) 10 MG tablet Take 10 mg by mouth daily as needed.    Taking  . HYDROcodone-acetaminophen (NORCO) 7.5-325 MG per tablet Take 1 tablet by mouth every 8 (eight)  hours as needed for pain.   Taking  . meloxicam (MOBIC) 15 MG tablet Take 7.5 mg by mouth 2 (two) times daily.    Taking  . Olmesartan-Amlodipine-HCTZ (TRIBENZOR) 40-5-25 MG TABS Take 1 tablet by mouth daily. 90 tablet 2   . omeprazole (PRILOSEC) 20 MG capsule TAKE 1 CAPSULE DAILY 90 capsule 2   . PROTOPIC 0.1 % ointment Apply 1 application topically 2 (two) times daily as needed.   Taking  . simvastatin (ZOCOR) 20 MG tablet TAKE 1 TABLET AT BEDTIME 90 tablet 2   . fluticasone (FLONASE) 50 MCG/ACT nasal spray Use one spray each nostril  Daily for 2 weeks (Patient not taking: Reported on 01/15/2015) 16 g 0 Not Taking at Unknown time  . oseltamivir (TAMIFLU) 75 MG capsule Take 1 capsule (75 mg total) by mouth daily. (Patient not taking: Reported on 01/15/2015) 10 capsule 0 Not Taking at Unknown time    ALLERGIES:  No Known Allergies  REVIEW OF SYSTEMS:  A comprehensive review of systems was negative.   FAMILY HISTORY:   Family History  Problem Relation Age of Onset  . Hypertension Mother   . Stroke Mother   . Diabetes Paternal Grandmother   . Alzheimer's disease Mother   . Parkinsonism Paternal Grandmother     SOCIAL HISTORY:  History  Substance Use Topics  . Smoking status: Never Smoker   . Smokeless tobacco: Never Used  . Alcohol Use: 2.0 oz/week    4 Standard drinks or equivalent per week     EXAMINATION:  Vital signs in last 24 hours:    General appearance: alert, cooperative and no distress Lungs: clear to auscultation bilaterally Heart: regular rate and rhythm, S1, S2 normal, no murmur, click, rub or gallop Abdomen: soft, non-tender; bowel sounds normal; no masses,  no organomegaly Extremities: extremities normal, atraumatic, no cyanosis or edema and Homans sign is negative, no sign of DVT Pulses: 2+ and symmetric Skin: Skin color, texture, turgor normal. No rashes or lesions Neurologic: Alert and oriented X 3, normal strength and tone. Normal symmetric reflexes.  Normal coordination and gait  Musculoskeletal:  ROM 0-110, Ligaments intact,  Imaging Review Plain radiographs demonstrate severe degenerative joint disease of the left knee. The overall alignment is significant varus. The bone quality appears to be good for age and reported activity level.  Assessment/Plan: Primary osteoarthritis, left knee   The patient history, physical examination and imaging studies are consistent with advanced degenerative joint disease of the left knee. The patient has failed conservative treatment.  The clearance notes were reviewed.  After discussion with the patient it was felt that Total Knee Replacement was indicated. The procedure,  risks, and benefits of total knee arthroplasty were presented and reviewed. The risks including but not limited to aseptic loosening, infection, blood clots, vascular injury, stiffness, patella tracking problems complications among others were discussed. The patient acknowledged the explanation, agreed to proceed with the plan.  Verlon Carcione 01/28/2015, 6:47 AM

## 2015-01-28 NOTE — Transfer of Care (Signed)
Immediate Anesthesia Transfer of Care Note  Patient: Pamela Malone  Procedure(s) Performed: Procedure(s): TOTAL KNEE ARTHROPLASTY (Left)  Patient Location: PACU  Anesthesia Type:GA combined with regional for post-op pain  Level of Consciousness: awake, alert , oriented and patient cooperative  Airway & Oxygen Therapy: Patient Spontanous Breathing and Patient connected to nasal cannula oxygen  Post-op Assessment: Report given to RN, Post -op Vital signs reviewed and stable and Patient moving all extremities  Post vital signs: Reviewed and stable  Last Vitals:  Filed Vitals:   01/28/15 0840  BP: 144/77  Pulse: 88  Temp:   Resp: 14    Complications: No apparent anesthesia complications

## 2015-01-28 NOTE — Evaluation (Signed)
Physical Therapy Evaluation Patient Details Name: Pamela Malone M Trumbo MRN: [id number] DOB: 1951-05-20 Today's Date: 01/28/2015   History of Present Illness  Pt is a 64 y/o F s/p L TKA.  Pt's PMH includes psoriasis, scoliosis, low back pain, HTN, depression, and cataracts.    Clinical Impression  Pt is s/p L TKA resulting in the deficits listed below (see PT Problem List). Despite pt's nausea w/ productive vomiting this session pt was able to perform stand pivot [facility] and ambulate 5 ft to recliner chair w/ min guard assist.  Patient needs to practice stairs next session. Pt will benefit from skilled PT to increase their independence and safety with mobility to allow discharge to the venue listed below.      Follow Up Recommendations Home health PT;Supervision for mobility/OOB    Equipment Recommendations  None recommended by PT    Recommendations for Other Services OT consult     Precautions / Restrictions Precautions Precautions: Knee;Fall Precaution Booklet Issued: Yes (comment) Precaution Comments: Reviwed no pillow under knee Restrictions Weight Bearing Restrictions: Yes LLE Weight Bearing: Weight bearing as tolerated      Mobility  Bed Mobility Overal bed mobility: Needs Assistance Bed Mobility: Supine to Sit     Supine to sit: Min assist     General bed mobility comments: Min A w/ managing LLE to sitting EOB, mod use of bed rails, cues for proper sequencing.  Transfers Overall transfer level: Needs assistance Equipment used: Rolling walker (2 wheeled) Transfers: Sit to/from [employer] CorporationStand;Stand Pivot Transfers Sit to Stand: Min guard Stand pivot transfers: Min guard       General transfer comment: Min guard and verbal cues for proper hand placement and sequencing w/ RW to pivot to [facility].    Ambulation/Gait Ambulation/Gait assistance: Min guard Ambulation Distance (Feet): 5 Feet Assistive device: Rolling walker (2 wheeled) Gait Pattern/deviations: Step-to  pattern;Decreased stride length;Decreased stance time - left;Shuffle;Antalgic;Trunk flexed   Gait velocity interpretation: Below normal speed for age/gender General Gait Details: cues to stand upright and cueing for use of RW when turning.   Weight shifts at bedside prior to stand pivot and ambulating to recliner chair.  Stairs            Wheelchair Mobility    Modified Rankin (Stroke Patients Only)       Balance Overall balance assessment: Needs assistance Sitting-balance support: Bilateral upper extremity supported;Feet supported Sitting balance-Leahy Scale: Fair     Standing balance support: Bilateral upper extremity supported;During functional activity Standing balance-Leahy Scale: Fair                               Pertinent Vitals/Pain Pain Assessment: 0-10 Pain Score: 10-Worst pain ever Pain Location: L knee Pain Descriptors / Indicators: Aching;Heaviness;Jabbing Pain Intervention(s): Limited activity within patient's tolerance;Monitored during session;Repositioned;RN gave pain meds during session;Ice applied    Home Living Family/patient expects to be discharged to:: Private residence Living Arrangements: Spouse/significant other Available Help at Discharge: Family;Available 24 hours/day (from husband) Type of Home: House (townhome) Home Access: Stairs to enter Entrance Stairs-Rails: None Entrance Stairs-Number of Steps: 2 Home Layout: One level Home Equipment: Environmental consultantWalker - 2 wheels;Bedside commode;Crutches      Prior Function Level of Independence: Independent               Hand Dominance   Dominant Hand: Right    Extremity/Trunk Assessment   Upper Extremity Assessment: Defer to OT evaluation  Lower Extremity Assessment: RLE deficits/detail;LLE deficits/detail RLE Deficits / Details: generalized weakness and pain in R ankle and knee 2/2 arthritis LLE Deficits / Details: weakness and limited ROM as expected s/p L  TKA     Communication   Communication: No difficulties  Cognition Arousal/Alertness: Awake/alert Behavior During Therapy: WFL for tasks assessed/performed Overall Cognitive Status: Within Functional Limits for tasks assessed                      General Comments General comments (skin integrity, edema, etc.): Pt w/ productive vomiting this session sitting up in bed, sitting EOB, and ambulating to recliner chair. RN in room and provided medication.    Exercises Total Joint Exercises Ankle Circles/Pumps: AROM;Both;10 reps;Supine Quad Sets: AROM;Both;10 reps;Supine      Assessment/Plan    PT Assessment Patient needs continued PT services  PT Diagnosis Difficulty walking;Abnormality of gait;Generalized weakness;Acute pain   PT Problem List Decreased strength;Decreased range of motion;Decreased activity tolerance;Decreased balance;Decreased mobility;Decreased coordination;Decreased knowledge of use of DME;Decreased safety awareness;Decreased knowledge of precautions;Impaired sensation;Decreased skin integrity;Pain  PT Treatment Interventions DME instruction;Gait training;Stair training;Functional mobility training;Therapeutic activities;Therapeutic exercise;Balance training;Neuromuscular re-education;Patient/family education;Modalities   PT Goals (Current goals can be found in the Care Plan section) Acute Rehab PT Goals Patient Stated Goal: to go home tomorrow PT Goal Formulation: With patient/family Time For Goal Achievement: 02/04/15 Potential to Achieve Goals: Good    Frequency 7X/week   Barriers to discharge Inaccessible home environment 2 steps to enter home w/o rails    Co-evaluation               End of Session Equipment Utilized During Treatment: Gait belt Activity Tolerance: Patient limited by pain;Other (comment) (nausea ) Patient left: in chair;with call bell/phone within reach;with family/visitor present Nurse Communication: Mobility  status;Precautions;Weight bearing status         Time: 1610-9604 PT Time Calculation (min) (ACUTE ONLY): 46 min   Charges:   PT Evaluation $Initial PT Evaluation Tier I: 1 Procedure PT Treatments $Therapeutic Exercise: 8-22 mins $Therapeutic Activity: 8-22 mins   PT G Codes:       Michail Jewels PT, DPT [phone] Pager: [phone] 01/28/2015, 4:22 PM

## 2015-01-29 ENCOUNTER — Encounter (HOSPITAL_COMMUNITY): Payer: Self-pay | Admitting: Orthopedic Surgery

## 2015-01-29 MED ORDER — OXYCODONE HCL ER 10 MG PO T12A: 10.0000 mg | EXTENDED_RELEASE_TABLET | Freq: Two times a day (BID) | ORAL | Status: DC

## 2015-01-29 MED ORDER — ONDANSETRON HCL 4 MG PO TABS: 4.0000 mg | ORAL_TABLET | Freq: Four times a day (QID) | ORAL | Status: DC | PRN

## 2015-01-29 MED ORDER — METHOCARBAMOL 500 MG PO TABS: 500.0000 mg | ORAL_TABLET | Freq: Four times a day (QID) | ORAL | Status: DC | PRN

## 2015-01-29 MED ORDER — OXYCODONE HCL 5 MG PO TABS: 5.0000 mg | ORAL_TABLET | ORAL | Status: DC | PRN

## 2015-01-29 MED ORDER — ENOXAPARIN SODIUM 40 MG/0.4ML ~~LOC~~ SOLN: 40.0000 mg | SUBCUTANEOUS | Status: DC

## 2015-01-29 NOTE — Progress Notes (Cosign Needed)
SPORTS MEDICINE AND JOINT REPLACEMENT  Pamela SpurlingStephen Lucey, MD   Altamese CabalMaurice Tolulope Pinkett, PA-C [address], [place], [place]  [address]                             [phone]   PROGRESS NOTE  Subjective:  negative for Chest Pain  negative for Shortness of Breath  positive for Nausea/Vomiting   negative for Calf Pain  negative for Bowel Movement   Tolerating Diet: yes         Patient reports pain as 5 on 0-10 scale.    Objective: Vital signs in last 24 hours:   Patient Vitals for the past 24 hrs:  BP Temp Temp src Pulse Resp SpO2  01/29/15 1255 116/67 mmHg 99.1 F (37.3 C) - 95 16 98 %  01/29/15 0451 127/76 mmHg 98.3 F (36.8 C) Oral 91 16 99 %  01/29/15 0104 120/68 mmHg 98.4 F (36.9 C) Oral 97 16 96 %  01/28/15 2003 119/64 mmHg 97.8 F (36.6 C) Oral 87 17 100 %    @flow {1959:LAST@   Intake/Output from previous day:   05/02 0701 - 05/03 0700 In: 2142.5 [P.O.:120; I.V.:1972.5] Out: 310 [Drains:235]   Intake/Output this shift:       Intake/Output      05/02 0701 - 05/03 0700 05/03 0701 - 05/04 0700   P.O. 120    I.V. (mL/kg) 1972.5 (24.7)    IV Piggyback 50    Total Intake(mL/kg) 2142.5 (26.8)    Urine (mL/kg/hr) 0 (0)    Drains 235 (0.1)    Blood 75 (0)    Total Output 310     Net +1832.5          Urine Occurrence 3 x 1 x      LABORATORY DATA:  Recent Labs  01/28/15 1432  WBC 15.7*  HGB 12.0  HCT 35.4*  PLT 317    Recent Labs  01/28/15 1432  CREATININE 0.86   Lab Results  Component Value Date   INR 0.97 01/18/2015    Examination:  General appearance: alert, cooperative and no distress Extremities: Homans sign is negative, no sign of DVT  Wound Exam: clean, dry, intact   Drainage:  None: wound tissue dry  Motor Exam: EHL and FHL Intact  Sensory Exam: Deep Peroneal normal   Assessment:    1 Day Post-Op  Procedure(s) (LRB): TOTAL KNEE ARTHROPLASTY (Left)  ADDITIONAL DIAGNOSIS:  Active Problems:   S/P total knee  arthroplasty  Acute Blood Loss Anemia   Plan: Physical Therapy as ordered Weight Bearing as Tolerated (WBAT)  DVT Prophylaxis:  Lovenox  DISCHARGE PLAN: Home  DISCHARGE NEEDS: HHPT, CPM, Walker and 3-in-1 comode seat         Maurio Baize 01/29/2015, 2:27 PM

## 2015-01-29 NOTE — Care Management Note (Signed)
Case Management Note  Patient Details  Name: Pamela Malone MRN: [id number] Date of Birth: 03/11/1951  Subjective/Objective:                    Action/Plan:   Expected Discharge Date:  01/30/15               Expected Discharge Plan:  Home w Home Health Services  In-House Referral:  NA  Discharge planning Services  CM Consult  Post Acute Care Choice:  Home Health, Durable Medical Equipment Choice offered to:  Patient  DME Arranged:  3-N-1, Walker rolling, CPM DME Agency:  [employer]  HH Arranged:  PT HH Agency:  [facility]  Status of Service:  Completed, signed off  Medicare Important Message Given:    Date Medicare IM Given:    Medicare IM give by:    Date Additional Medicare IM Given:    Additional Medicare Important Message give by:     If discussed at Long Length of Stay Meetings, dates discussed:    Additional Comments:  Durenda GuthrieBrady, Lathon Adan Naomi, RN 01/29/2015, 12:10 PM

## 2015-01-29 NOTE — Evaluation (Signed)
Occupational Therapy Evaluation Patient Details Name: Pamela Malone MRN: [id number] DOB: 03-05-1951 Today's Date: 01/29/2015    History of Present Illness Pt is a 64 y/o F s/p L TKA.  Pt's PMH includes psoriasis, scoliosis, low back pain, HTN, depression, and cataracts.     Clinical Impression   Pt admitted with the above diagnoses and presents with below problem list. Pt will benefit from continued acute OT to address the below listed deficits and maximize independence with BADLs prior to d/c home with family. PTA pt was independent with ADLs. Pt currently min guard level for LB ADLs. ADLs completed and education provided to pt and family as detailed below. OT to continue to follow acutely.     Follow Up Recommendations  Supervision/Assistance - 24 hour;No OT follow up    Equipment Recommendations  None recommended by OT    Recommendations for Other Services       Precautions / Restrictions Precautions Precautions: Knee;Fall Precaution Comments: revieweed Restrictions Weight Bearing Restrictions: Yes LLE Weight Bearing: Weight bearing as tolerated      Mobility Bed Mobility Overal bed mobility: Needs Assistance Bed Mobility: Supine to Sit     Supine to sit: Min assist     General bed mobility comments: min A to advance LLE off bed onto floor. Pt used leg hook technique to advance across bed.  Transfers Overall transfer level: Needs assistance Equipment used: Rolling walker (2 wheeled) Transfers: Sit to/from Stand Sit to Stand: Min guard         General transfer comment: from EOB, 3n1 over toilet, and recliner. cues for technique with rw    Balance Overall balance assessment: Needs assistance Sitting-balance support: No upper extremity supported;Feet supported Sitting balance-Leahy Scale: Fair     Standing balance support: Bilateral upper extremity supported;During functional activity Standing balance-Leahy Scale: Fair                               ADL Overall ADL's : Needs assistance/impaired Eating/Feeding: Set up;Sitting   Grooming: Set up;Standing;Wash/dry hands   Upper Body Bathing: Set up;Sitting   Lower Body Bathing: Min guard;With adaptive equipment;Sit to/from stand   Upper Body Dressing : Set up;Sitting   Lower Body Dressing: Min guard;With adaptive equipment;Sit to/from stand   Toilet Transfer: Min guard;Ambulation;BSC;RW   Toileting- ArchitectClothing Manipulation and Hygiene: Min guard;Sit to/from stand   Tub/ Shower Transfer: Min guard;Ambulation;3 in 1;Rolling walker   Functional mobility during ADLs: Min guard;Rolling walker General ADL Comments: Pt ambulated to bathroom and completed toilet transfer and cm/h at min guard level with cues for technique with rw. ADL education provided to pt, spouse and daugher. Discussed home setup for toilet/shower transfers.     Vision     Perception     Praxis      Pertinent Vitals/Pain Pain Assessment: 0-10 Pain Score: 5  Pain Location: Lt knee OOB Pain Descriptors / Indicators: Aching Pain Intervention(s): Limited activity within patient's tolerance;Monitored during session;Repositioned     Hand Dominance Right   Extremity/Trunk Assessment Upper Extremity Assessment Upper Extremity Assessment: Overall WFL for tasks assessed   Lower Extremity Assessment Lower Extremity Assessment: Defer to PT evaluation       Communication Communication Communication: No difficulties   Cognition Arousal/Alertness: Awake/alert Behavior During Therapy: WFL for tasks assessed/performed Overall Cognitive Status: Within Functional Limits for tasks assessed  General Comments       Exercises       Shoulder Instructions      Home Living Family/patient expects to be discharged to:: Private residence Living Arrangements: Spouse/significant other Available Help at Discharge: Family;Available 24 hours/day Type of Home: House Home Access:  Stairs to enter [employer] of Steps: 2 Entrance Stairs-Rails: None Home Layout: One level     Bathroom Shower/Tub: Chief Strategy Officer: Standard Bathroom Accessibility: Yes How Accessible: Accessible via walker;Other (comment) (sidestep) Home Equipment: Walker - 2 wheels;Bedside commode;Crutches          Prior Functioning/Environment Level of Independence: Independent             OT Diagnosis: Acute pain   OT Problem List: Impaired balance (sitting and/or standing);Decreased knowledge of use of DME or AE;Decreased knowledge of precautions;Pain   OT Treatment/Interventions: Self-care/ADL training;DME and/or AE instruction;Patient/family education;Balance training;Therapeutic activities    OT Goals(Current goals can be found in the care plan section) Acute Rehab OT Goals Patient Stated Goal: home OT Goal Formulation: With patient/family Time For Goal Achievement: 02/05/15 Potential to Achieve Goals: Good ADL Goals Pt Will Perform Lower Body Dressing: with modified independence;with adaptive equipment;sit to/from stand Pt Will Perform Tub/Shower Transfer: with modified independence;ambulating;3 in 1;rolling walker  OT Frequency: Min 2X/week   Barriers to D/C:            Co-evaluation              End of Session Equipment Utilized During Treatment: Rolling walker CPM Left Knee CPM Left Knee: Off  Activity Tolerance: Patient tolerated treatment well Patient left: in chair;with call bell/phone within reach;with family/visitor present;Other (comment) (zero knee on)   Time: 1435-1455 OT Time Calculation (min): 20 min Charges:  OT General Charges $OT Visit: 1 Procedure OT Evaluation $Initial OT Evaluation Tier I: 1 Procedure G-Codes:    Pilar Grammes 02/16/15, 3:11 PM

## 2015-01-29 NOTE — Op Note (Signed)
TOTAL KNEE REPLACEMENT OPERATIVE NOTE:  01/28/2015  3:27 PM  PATIENT:  Pamela Malone  64 y.o. female  PRE-OPERATIVE DIAGNOSIS:  Primary Osteoarthritis Left Knee   POST-OPERATIVE DIAGNOSIS:  Primary Osteoarthritis Left Knee  PROCEDURE:  Procedure(s): TOTAL KNEE ARTHROPLASTY  SURGEON:  Surgeon(s): Dannielle Huh, MD  PHYSICIAN ASSISTANT: Altamese Cabal,  [facility]  ANESTHESIA:   general  DRAINS: Hemovac  SPECIMEN: None  COUNTS:  Correct  TOURNIQUET:   Total Tourniquet Time Documented: Thigh (Left) - 55 minutes Total: Thigh (Left) - 55 minutes   DICTATION:  Indication for procedure:    The patient is a 64 y.o. female who has failed conservative treatment for Primary Osteoarthritis Left Knee .  Informed consent was obtained prior to anesthesia. The risks versus benefits of the operation were explain and in a way the patient can, and did, understand.   On the implant demand matching protocol, this patient scored 8.  Therefore, this patient did" "did not receive a polyethylene insert with vitamin E which is a high demand implant.  Description of procedure:     The patient was taken to the operating room and placed under anesthesia.  The patient was positioned in the usual fashion taking care that all body parts were adequately padded and/or protected.  I foley catheter was not placed.  A tourniquet was applied and the leg prepped and draped in the usual sterile fashion.  The extremity was exsanguinated with the esmarch and tourniquet inflated to 350 mmHg.  Pre-operative range of motion was normal.  The knee was in 5 degree of mild varus.  A midline incision approximately 6-7 inches long was made with a #10 blade.  A new blade was used to make a parapatellar arthrotomy going 2-3 cm into the quadriceps tendon, over the patella, and alongside the medial aspect of the patellar tendon.  A synovectomy was then performed with the #10 blade and forceps. I then elevated the deep MCL off the  medial tibial metaphysis subperiosteally around to the semimembranosus attachment.    I everted the patella and used calipers to measure patellar thickness.  I used the reamer to ream down to appropriate thickness to recreate the native thickness.  I then removed excess bone with the rongeur and sagittal saw.  I used the appropriately sized template and drilled the three lug holes.  I then put the trial in place and measured the thickness with the calipers to ensure recreation of the native thickness.  The trial was then removed and the patella subluxed and the knee brought into flexion.  A homan retractor was place to retract and protect the patella and lateral structures.  A Z-retractor was place medially to protect the medial structures.  The extra-medullary alignment system was used to make cut the tibial articular surface perpendicular to the anamotic axis of the tibia and in 3 degrees of posterior slope.  The cut surface and alignment jig was removed.  I then used the intramedullary alignment guide to make a 6 valgus cut on the distal femur.  I then marked out the epicondylar axis on the distal femur.  The posterior condylar axis measured 3 degrees.  I then used the anterior referencing sizer and measured the femur to be a size 6.  The 4-In-1 cutting block was screwed into place in external rotation matching the posterior condylar angle, making our cuts perpendicular to the epicondylar axis.  Anterior, posterior and chamfer cuts were made with the sagittal saw.  The cutting block and  cut pieces were removed.  A lamina spreader was placed in 90 degrees of flexion.  The ACL, PCL, menisci, and posterior condylar osteophytes were removed.  A 12 mm spacer blocked was found to offer good flexion and extension gap balance after minimal in degree releasing.   The scoop retractor was then placed and the femoral finishing block was pinned in place.  The small sagittal saw was used as well as the lug drill to  finish the femur.  The block and cut surfaces were removed and the medullary canal hole filled with autograft bone from the cut pieces.  The tibia was delivered forward in deep flexion and external rotation.  A size D tray was selected and pinned into place centered on the medial 1/3 of the tibial tubercle.  The reamer and keel was used to prepare the tibia through the tray.    I then trialed with the size 6 femur, size E tibia, a 12 mm insert and the 32 patella.  I had excellent flexion/extension gap balance, excellent patella tracking.  Flexion was full and beyond 120 degrees; extension was zero.  These components were chosen and the staff opened them to me on the back table while the knee was lavaged copiously and the cement mixed.  The soft tissue was infiltrated with 60cc of exparel 1.3% through a 21 gauge needle.  I cemented in the components and removed all excess cement.  The polyethylene tibial component was snapped into place and the knee placed in extension while cement was hardening.  The capsule was infilltrated with 30cc of .25% Marcaine with epinephrine.  A hemovac was place in the joint exiting superolaterally.  A pain pump was place superomedially superficial to the arthrotomy.  Once the cement was hard, the tourniquet was let down.  Hemostasis was obtained.  The arthrotomy was closed with figure-8 #1 vicryl sutures.  The deep soft tissues were closed with #0 vicryls and the subcuticular layer closed with a running #2-0 vicryl.  The skin was reapproximated and closed with skin staples.  The wound was dressed with xeroform, 4 x4's, 2 ABD sponges, a single layer of webril and a TED stocking.   The patient was then awakened, extubated, and taken to the recovery room in stable condition.  BLOOD LOSS:  300cc DRAINS: 1 hemovac, 1 pain catheter COMPLICATIONS:  None.  PLAN OF CARE: Admit to inpatient   PATIENT DISPOSITION:  PACU - hemodynamically stable.   Delay start of Pharmacological  VTE agent (>24hrs) due to surgical blood loss or risk of bleeding:  not applicable  Please fax a copy of this op note to my office at [phone] (please only include page 1 and 2 of the Case Information op note)

## 2015-01-29 NOTE — Discharge Instructions (Signed)
INSTRUCTIONS AFTER JOINT REPLACEMENT   o Remove items at home which could result in a fall. This includes throw rugs or furniture in walking pathways o ICE to the affected joint every three hours while awake for 30 minutes at a time, for at least the first 3-5 days, and then as needed for pain and swelling.  Continue to use ice for pain and swelling. You may notice swelling that will progress down to the foot and ankle.  This is normal after surgery.  Elevate your leg when you are not up walking on it.   o Continue to use the breathing machine you got in the hospital (incentive spirometer) which will help keep your temperature down.  It is common for your temperature to cycle up and down following surgery, especially at night when you are not up moving around and exerting yourself.  The breathing machine keeps your lungs expanded and your temperature down.   DIET:  As you were doing prior to hospitalization, we recommend a well-balanced diet.  DRESSING / WOUND CARE / SHOWERING  May change dressing on Thursday  ACTIVITY  o Increase activity slowly as tolerated, but follow the weight bearing instructions below.   o No driving for 6 weeks or until further direction given by your physician.  You cannot drive while taking narcotics.  o No lifting or carrying greater than 10 lbs. until further directed by your surgeon. o Avoid periods of inactivity such as sitting longer than an hour when not asleep. This helps prevent blood clots.  o You may return to work once you are authorized by your doctor.     WEIGHT BEARING   Weight bearing as tolerated with assist device (walker, cane, etc) as directed, use it as long as suggested by your surgeon or therapist, typically at least 4-6 weeks.   EXERCISES  Results after joint replacement surgery are often greatly improved when you follow the exercise, range of motion and muscle strengthening exercises prescribed by your doctor. Safety measures are also  important to protect the joint from further injury. Any time any of these exercises cause you to have increased pain or swelling, decrease what you are doing until you are comfortable again and then slowly increase them. If you have problems or questions, call your caregiver or physical therapist for advice.   Rehabilitation is important following a joint replacement. After just a few days of immobilization, the muscles of the leg can become weakened and shrink (atrophy).  These exercises are designed to build up the tone and strength of the thigh and leg muscles and to improve motion. Often times heat used for twenty to thirty minutes before working out will loosen up your tissues and help with improving the range of motion but do not use heat for the first two weeks following surgery (sometimes heat can increase post-operative swelling).   These exercises can be done on a training (exercise) mat, on the floor, on a table or on a bed. Use whatever works the best and is most comfortable for you.    Use music or television while you are exercising so that the exercises are a pleasant break in your day. This will make your life better with the exercises acting as a break in your routine that you can look forward to.   Perform all exercises about fifteen times, three times per day or as directed.  You should exercise both the operative leg and the other leg as well.  Exercises include:  Quad Sets - Tighten up the muscle on the front of the thigh (Quad) and hold for 5-10 seconds.   °• Straight Leg Raises - With your knee straight (if you were given a brace, keep it on), lift the leg to 60 degrees, hold for 3 seconds, and slowly lower the leg.  Perform this exercise against resistance later as your leg gets stronger.  °• Leg Slides: Lying on your back, slowly slide your foot toward your buttocks, bending your knee up off the floor (only go as far as is comfortable). Then slowly slide your foot back down until  your leg is flat on the floor again.  °• Angel Wings: Lying on your back spread your legs to the side as far apart as you can without causing discomfort.  °• Hamstring Strength:  Lying on your back, push your heel against the floor with your leg straight by tightening up the muscles of your buttocks.  Repeat, but this time bend your knee to a comfortable angle, and push your heel against the floor.  You may put a pillow under the heel to make it more comfortable if necessary.  ° °A rehabilitation program following joint replacement surgery can speed recovery and prevent re-injury in the future due to weakened muscles. Contact your doctor or a physical therapist for more information on knee rehabilitation.  ° ° °CONSTIPATION ° °Constipation is defined medically as fewer than three stools per week and severe constipation as less than one stool per week.  Even if you have a regular bowel pattern at home, your normal regimen is likely to be disrupted due to multiple reasons following surgery.  Combination of anesthesia, postoperative narcotics, change in appetite and fluid intake all can affect your bowels.  ° °YOU MUST use at least one of the following options; they are listed in order of increasing strength to get the job done.  They are all available over the counter, and you may need to use some, POSSIBLY even all of these options:   ° °Drink plenty of fluids (prune juice may be helpful) and high fiber foods °Colace 100 mg by mouth twice a day  °Senokot for constipation as directed and as needed Dulcolax (bisacodyl), take with full glass of water  °Miralax (polyethylene glycol) once or twice a day as needed. ° °If you have tried all these things and are unable to have a bowel movement in the first 3-4 days after surgery call either your surgeon or your primary doctor.   ° °If you experience loose stools or diarrhea, hold the medications until you stool forms back up.  If your symptoms do not get better within 1 week  or if they get worse, check with your doctor.  If you experience "the worst abdominal pain ever" or develop nausea or vomiting, please contact the office immediately for further recommendations for treatment. ° ° °ITCHING:  If you experience itching with your medications, try taking only a single pain pill, or even half a pain pill at a time.  You can also use Benadryl over the counter for itching or also to help with sleep.  ° °TED HOSE STOCKINGS:  Use stockings on both legs until for at least 2 weeks or as directed by physician office. They may be removed at night for sleeping. ° °MEDICATIONS:  See your medication summary on the “After Visit Summary” that nursing will review with you.  You may have some home medications which will be placed on hold until   you complete the course of blood thinner medication.  It is important for you to complete the blood thinner medication as prescribed. ° °PRECAUTIONS:  If you experience chest pain or shortness of breath - call 911 immediately for transfer to the hospital emergency department.  ° °If you develop a fever greater that 101 F, purulent drainage from wound, increased redness or drainage from wound, foul odor from the wound/dressing, or calf pain - CONTACT YOUR SURGEON.   °                                                °FOLLOW-UP APPOINTMENTS:  If you do not already have a post-op appointment, please call the office for an appointment to be seen by your surgeon.  Guidelines for how soon to be seen are listed in your “After Visit Summary”, but are typically between 1-4 weeks after surgery. ° °OTHER INSTRUCTIONS:  ° °Knee Replacement:  Do not place pillow under knee, focus on keeping the knee straight while resting. CPM instructions: 0-90 degrees, 2 hours in the morning, 2 hours in the afternoon, and 2 hours in the evening. Place foam block, curve side up under heel at all times except when in CPM or when walking.  DO NOT modify, tear, cut, or change the foam block in any  way. ° °MAKE SURE YOU:  °• Understand these instructions.  °• Get help right away if you are not doing well or get worse.  ° ° °Thank you for letting us be a part of your medical care team.  It is a privilege we respect greatly.  We hope these instructions will help you stay on track for a fast and full recovery!  ° °

## 2015-01-29 NOTE — Progress Notes (Signed)
Physical Therapy Treatment Patient Details Name: Pamela Malone MRN: [id number] DOB: [birth year] Today's Date: 01/29/2015    History of Present Illness Pt is a 64 y/o F s/p L TKA.  Pt's PMH includes psoriasis, scoliosis, low back pain, HTN, depression, and cataracts.      PT Comments    Pt continues to be limited with mobility due to nausea and dizziness during ambulation. Pt able to perform HEP in bed. Focus on progressive ambulation as pt is able next session. Pt will also need to practice stairs before going home. Continue with POC.  Follow Up Recommendations  Home health PT;Supervision for mobility/OOB     Equipment Recommendations  None recommended by PT    Recommendations for Other Services OT consult     Precautions / Restrictions Precautions Precautions: Knee;Fall Restrictions LLE Weight Bearing: Weight bearing as tolerated    Mobility  Bed Mobility Overal bed mobility: Needs Assistance Bed Mobility: Supine to Sit     Supine to sit: Min assist     General bed mobility comments: Min A for LLE for supine to sit. Pt educated on and able to use leg hook technique to move BLE back onto bed for sit to supine.  Transfers Overall transfer level: Needs assistance Equipment used: Rolling walker (2 wheeled) Transfers: Sit to/from Stand Sit to Stand: Min guard         General transfer comment: Min guard for safety. Cues for hand placement and safety.  Ambulation/Gait Ambulation/Gait assistance: Min guard Ambulation Distance (Feet): 7 Feet Assistive device: Rolling walker (2 wheeled) Gait Pattern/deviations: Step-to pattern;Decreased stance time - left;Decreased stride length;Antalgic;Trunk flexed   Gait velocity interpretation: Below normal speed for age/gender General Gait Details: Cues for gait sequencing. Pt c/o nausea, lightheadness, and dizziness.   Stairs            Wheelchair Mobility    Modified Rankin (Stroke Patients Only)       Balance                                     Cognition Arousal/Alertness: Awake/alert Behavior During Therapy: WFL for tasks assessed/performed Overall Cognitive Status: Within Functional Limits for tasks assessed                      Exercises Total Joint Exercises Ankle Circles/Pumps: AROM;Both;10 reps Quad Sets: AROM;Left;5 reps Short Arc Quad: AROM;5 reps;Left Heel Slides: AAROM;Left;5 reps Hip ABduction/ADduction: AAROM;Left;5 reps Straight Leg Raises: AAROM;Left;5 reps    General Comments        Pertinent Vitals/Pain Pain Assessment: 0-10 Pain Score: 7  Pain Location: L knee with ambulation Pain Descriptors / Indicators: Sore;Aching Pain Intervention(s): Monitored during session;Patient requesting pain meds-RN notified;Limited activity within patient's tolerance    Home Living                      Prior Function            PT Goals (current goals can now be found in the care plan section) Progress towards PT goals: Progressing toward goals    Frequency  7X/week    PT Plan Current plan remains appropriate    Co-evaluation             End of Session Equipment Utilized During Treatment: Gait belt Activity Tolerance: Patient limited by pain;Other (comment) (Pt limited by dizziness/nausea.) Patient left: in bed;with call  bell/phone within reach;with family/visitor present     Time: [id number] PT Time Calculation (min) (ACUTE ONLY): 43 min  Charges:                       G CodesLeonard Schwartz:      Ainara Eldridge, SPTA 01/29/2015, 9:50 AM

## 2015-01-29 NOTE — Progress Notes (Signed)
Physical Therapy Treatment Patient Details Name: Pamela DeerJudy M Malone MRN: [id number] DOB: Oct 26, 1950 Today's Date: 01/29/2015    History of Present Illness Pt is a 64 y/o F s/p L TKA.  Pt's PMH includes psoriasis, scoliosis, low back pain, HTN, depression, and cataracts.      PT Comments    Pt is making progress toward goals, although progress is slow due to dizziness during ambulation. Pt would benefit from continued PT to increase functional independence. Will need to practice stairs prior to D/C. Continue with POC.  Follow Up Recommendations  Home health PT;Supervision for mobility/OOB     Equipment Recommendations  None recommended by PT    Recommendations for Other Services OT consult     Precautions / Restrictions Precautions Precautions: Knee;Fall Precaution Comments: revieweed Restrictions Weight Bearing Restrictions: Yes LLE Weight Bearing: Weight bearing as tolerated    Mobility  Bed Mobility Overal bed mobility: Needs Assistance Bed Mobility: Supine to Sit     Supine to sit: Min assist     General bed mobility comments: Pt in chair before and after.  Transfers Overall transfer level: Needs assistance Equipment used: Rolling walker (2 wheeled) Transfers: Sit to/from Stand Sit to Stand: Min guard         General transfer comment: Min guard for safety. Cues for hand placement.  Ambulation/Gait Ambulation/Gait assistance: Min guard Ambulation Distance (Feet): 70 Feet Assistive device: Rolling walker (2 wheeled) Gait Pattern/deviations: Step-to pattern;Decreased stance time - left;Antalgic   Gait velocity interpretation: Below normal speed for age/gender General Gait Details: Cues for sequencing and RW position. Pt c/o dizziness.   Stairs            Wheelchair Mobility    Modified Rankin (Stroke Patients Only)       Balance Overall balance assessment: Needs assistance Sitting-balance support: No upper extremity supported;Feet  supported Sitting balance-Leahy Scale: Fair     Standing balance support: Bilateral upper extremity supported;During functional activity Standing balance-Leahy Scale: Fair                      Cognition Arousal/Alertness: Awake/alert Behavior During Therapy: WFL for tasks assessed/performed Overall Cognitive Status: Within Functional Limits for tasks assessed                      Exercises      General Comments        Pertinent Vitals/Pain Pain Assessment: 0-10 Pain Score: 4  Pain Location: L knee during ambulation Pain Descriptors / Indicators: Sore Pain Intervention(s): Monitored during session    Home Living Family/patient expects to be discharged to:: Private residence Living Arrangements: Spouse/significant other Available Help at Discharge: Family;Available 24 hours/day Type of Home: House Home Access: Stairs to enter Entrance Stairs-Rails: None Home Layout: One level Home Equipment: Environmental consultantWalker - 2 wheels;Bedside commode;Crutches      Prior Function Level of Independence: Independent          PT Goals (current goals can now be found in the care plan section) Acute Rehab PT Goals Patient Stated Goal: home Progress towards PT goals: Progressing toward goals    Frequency  7X/week    PT Plan Current plan remains appropriate    Co-evaluation             End of Session Equipment Utilized During Treatment: Gait belt Activity Tolerance: Other (comment) (Pt limited by dizziness.) Patient left: in chair;with call bell/phone within reach;with family/visitor present     Time: [id number] PT  Time Calculation (min) (ACUTE ONLY): 27 min  Charges:                       G CodesLeonard Schwartz, SPTA 01/29/2015, 3:31 PM

## 2015-01-29 NOTE — Progress Notes (Signed)
01/29/15  Case manager spoke with patient concerning home health and DME needs. Choice was offered, referral was called to Samul DadaMiranda Chris, [facility] Liaison. Patient has received her DME through [employer]. Patient has family assistance at discharge. Vance PeperSusan Perseus Westall, RN BSN Case Manager [phone]

## 2015-01-29 NOTE — Care Management Note (Signed)
Case Management Note  Patient Details  Name: Foye DeerJudy M Shinall MRN: [id number] Date of Birth: 1951-03-15  Subjective/Objective:                    Action/Plan:   Expected Discharge Date:  01/30/15               Expected Discharge Plan:  Home w Home Health Services  In-House Referral:  NA  Discharge planning Services  CM Consult  Post Acute Care Choice:  Home Health, Durable Medical Equipment Choice offered to:  Patient  DME Arranged:  3-N-1, Walker rolling, CPM DME Agency:  [employer]  HH Arranged:  PT HH Agency:  [facility]  Status of Service:  Completed, signed off  Medicare Important Message Given:    Date Medicare IM Given:    Medicare IM give by:    Date Additional Medicare IM Given:    Additional Medicare Important Message give by:     If discussed at Long Length of Stay Meetings, dates discussed:    Additional Comments:  Durenda GuthrieBrady, Almer [place]ittleton Naomi, RN 01/29/2015, 12:06 PM

## 2015-01-29 NOTE — Plan of Care (Signed)
Problem: Consults Goal: Diagnosis- Total Joint Replacement Primary Total Knee Left     

## 2015-01-30 NOTE — Progress Notes (Signed)
Physical Therapy Treatment Patient Details Name: Foye DeerJudy M Beldin MRN: [id number] DOB: 1950/11/18 Today's Date: 01/30/2015    History of Present Illness Pt is a 64 y/o F s/p L TKA.  Pt's PMH includes psoriasis, scoliosis, low back pain, HTN, depression, and cataracts.      PT Comments    Pt able to progress ambulation this AM and practice stairs with husband present. Both pt and husband feel comfortable with pt going up/down stairs at home. Pt safe to D/C from a mobility standpoint based on progression toward goals set on PT eval.  Follow Up Recommendations  Home health PT;Supervision for mobility/OOB     Equipment Recommendations  None recommended by PT    Recommendations for Other Services OT consult     Precautions / Restrictions Precautions Precautions: Knee;Fall Restrictions LLE Weight Bearing: Weight bearing as tolerated    Mobility  Bed Mobility Overal bed mobility: Modified Independent                Transfers Overall transfer level: Needs assistance Equipment used: Rolling walker (2 wheeled) Transfers: Sit to/from Stand Sit to Stand: Supervision         General transfer comment: Supervision for safety.  Ambulation/Gait Ambulation/Gait assistance: Min guard Ambulation Distance (Feet): 200 Feet Assistive device: Rolling walker (2 wheeled) Gait Pattern/deviations: Step-through pattern;Decreased stance time - left;Antalgic;Trunk flexed   Gait velocity interpretation: Below normal speed for age/gender General Gait Details: Cues for RW position, upright posture/forward head, and to keep RW moving. Able to progress to step-through pattern.   Stairs Stairs: Yes Stairs assistance: Min assist Stair Management: No rails;Step to pattern;Backwards;With walker Number of Stairs: 2 General stair comments: Pt and husband educated on proper sequencing and technique. Pt able to go up/down steps x2 with husband providing assistance second time. LOB x1 when bringing  RW up onto step on first attempt. No LOB on second attempt.  Wheelchair Mobility    Modified Rankin (Stroke Patients Only)       Balance                                    Cognition Arousal/Alertness: Awake/alert Behavior During Therapy: WFL for tasks assessed/performed Overall Cognitive Status: Within Functional Limits for tasks assessed                      Exercises Total Joint Exercises Quad Sets: AROM;Both;10 reps Heel Slides: AAROM;Left;10 reps Hip ABduction/ADduction: AAROM;Left;10 reps Straight Leg Raises: AAROM;Left;10 reps    General Comments        Pertinent Vitals/Pain Pain Assessment: 0-10 Pain Score: 5  Pain Location: L knee Pain Descriptors / Indicators: Sore;Tightness Pain Intervention(s): Monitored during session    Home Living                      Prior Function            PT Goals (current goals can now be found in the care plan section) Progress towards PT goals: Progressing toward goals    Frequency  7X/week    PT Plan Current plan remains appropriate    Co-evaluation             End of Session Equipment Utilized During Treatment: Gait belt Activity Tolerance: Patient tolerated treatment well Patient left: in chair;with call bell/phone within reach;with family/visitor present     Time: [id number] PT Time Calculation (min) (  ACUTE ONLY): 26 min  Charges:                       G Codes:      Leonard SchwartzRumley, Coner Gibbard, SPTA 01/30/2015, 10:20 AM

## 2015-02-04 NOTE — Discharge Summary (Signed)
SPORTS MEDICINE & JOINT REPLACEMENT   Georgena Spurling, MD   Altamese Cabal, PA-C [address] [place], [place], [place]  [address]                             [phone]  PATIENT ID: Pamela Malone        MRN:  [id number]          DOB/AGE: 1951/05/08 / 64 y.o.    DISCHARGE SUMMARY  ADMISSION DATE:    01/28/2015 DISCHARGE DATE:   01/30/2015  ADMISSION DIAGNOSIS: Primary Osteoarthritis Left Knee     DISCHARGE DIAGNOSIS:  Primary Osteoarthritis Left Knee     ADDITIONAL DIAGNOSIS: Active Problems:   S/P total knee arthroplasty  Past Medical History  Diagnosis Date  . Scoliosis   . Menopause   . Psoriasis   . Low back pain   . DJD (degenerative joint disease)   . GERD (gastroesophageal reflux disease)   . Hyperlipidemia   . Hypertension   . History of right bundle branch block (RBBB)     released after cardiac evaluation by Dr Donnie Aho 12/2014; gave surgical clearance  . Depression     many years ago  . Nocturia   . Cataract     immature    PROCEDURE: Procedure(s): TOTAL KNEE ARTHROPLASTY on 01/28/2015  CONSULTS:     HISTORY:  See H&P in chart  HOSPITAL COURSE:  Pamela Malone is a 64 y.o. admitted on 01/28/2015 and found to have a diagnosis of Primary Osteoarthritis Left Knee .  After appropriate laboratory studies were obtained  they were taken to the operating room on 01/28/2015 and underwent Procedure(s): TOTAL KNEE ARTHROPLASTY.   They were given perioperative antibiotics:  Anti-infectives    Start     Dose/Rate Route Frequency Ordered Stop   01/28/15 1500  ceFAZolin (ANCEF) IVPB 2 g/50 mL premix     2 g 100 mL/hr over 30 Minutes Intravenous Every 6 hours 01/28/15 1317 01/28/15 2105   01/28/15 0654  ceFAZolin (ANCEF) 2-3 GM-% IVPB SOLR    Comments:  Block, Sarah   : cabinet override      01/28/15 0654 01/28/15 1859   01/28/15 0650  ceFAZolin (ANCEF) IVPB 2 g/50 mL premix     2 g 100 mL/hr over 30 Minutes Intravenous On call to O.R. 01/28/15 2130 01/28/15 0904     .  Tolerated the procedure well.  Placed with a foley intraoperatively.  Given Ofirmev at induction and for 48 hours.    POD# 1: Vital signs were stable.  Patient denied Chest pain, shortness of breath, or calf pain.  Patient was started on Lovenox 30 mg subcutaneously twice daily at 8am.  Consults to PT, OT, and care management were made.  The patient was weight bearing as tolerated.  CPM was placed on the operative leg 0-90 degrees for 6-8 hours a day.  Incentive spirometry was taught.  Dressing was changed.  Hemovac was discontinued.      POD #2, Continued  PT for ambulation and exercise program.  IV saline locked.  O2 discontinued.    The remainder of the hospital course was dedicated to ambulation and strengthening.   The patient was discharged on 2 days post op in  Good condition.  Blood products given:none  DIAGNOSTIC STUDIES: Recent vital signs: No data found.      Recent laboratory studies:  Recent Labs  01/28/15 1432  WBC 15.7*  HGB 12.0  HCT 35.4*  PLT 317    Recent Labs  01/28/15 1432  CREATININE 0.86   Lab Results  Component Value Date   INR 0.97 01/18/2015     Recent Radiographic Studies :  Dg Chest 2 View  01/18/2015   CLINICAL DATA:  Left knee replacement surgery.  EXAM: CHEST  2 VIEW  COMPARISON:  03/20/2010  FINDINGS: The heart size and mediastinal contours are within normal limits. Both lungs are clear. Thoracic scoliosis deformity noted.  IMPRESSION: 1. Thoracic scoliosis. 2. No active cardiopulmonary abnormalities   Electronically Signed   By: Signa Kellaylor  Stroud M.D.   On: 01/18/2015 17:13    DISCHARGE INSTRUCTIONS: Discharge Instructions    CPM    Complete by:  As directed   Continuous passive motion machine (CPM):      Use the CPM from 0 to 90 for 6-8 hours per day.      You may increase by 10 per day.  You may break it up into 2 or 3 sessions per day.      Use CPM for 2 weeks or until you are told to stop.     Call MD / Call 911    Complete  by:  As directed   If you experience chest pain or shortness of breath, CALL 911 and be transported to the hospital emergency room.  If you develope a fever above 101 F, pus (white drainage) or increased drainage or redness at the wound, or calf pain, call your surgeon's office.     Change dressing    Complete by:  As directed   Change dressing on Thursday, then change the dressing daily with sterile 4 x 4 inch gauze dressing and apply TED hose.     Constipation Prevention    Complete by:  As directed   Drink plenty of fluids.  Prune juice may be helpful.  You may use a stool softener, such as Colace (over the counter) 100 mg twice a day.  Use MiraLax (over the counter) for constipation as needed.     Diet - low sodium heart healthy    Complete by:  As directed      Do not put a pillow under the knee. Place it under the heel.    Complete by:  As directed      Driving restrictions    Complete by:  As directed   No driving for 6 weeks     Increase activity slowly as tolerated    Complete by:  As directed      Lifting restrictions    Complete by:  As directed   No lifting for 6 weeks     TED hose    Complete by:  As directed   Use stockings (TED hose) for 2 weeks on both leg(s).  You may remove them at night for sleeping.           DISCHARGE MEDICATIONS:     Medication List    STOP taking these medications        HYDROcodone-acetaminophen 7.5-325 MG per tablet  Commonly known as:  NORCO      TAKE these medications        cetirizine 10 MG tablet  Commonly known as:  ZYRTEC  Take 10 mg by mouth daily as needed.     enoxaparin 40 MG/0.4ML injection  Commonly known as:  LOVENOX  Inject 0.4 mLs (40 mg total) into the skin daily.  fluticasone 50 MCG/ACT nasal spray  Commonly known as:  FLONASE  Use one spray each nostril  Daily for 2 weeks     HUMIRA PEN [place]  Inject 40 mg into the skin every 14 (fourteen) days.     meloxicam 15 MG tablet  Commonly known as:  MOBIC   Take 7.5 mg by mouth 2 (two) times daily.     methocarbamol 500 MG tablet  Commonly known as:  ROBAXIN  Take 1-2 tablets (500-1,000 mg total) by mouth every 6 (six) hours as needed for muscle spasms.     Olmesartan-Amlodipine-HCTZ 40-5-25 MG Tabs  Commonly known as:  TRIBENZOR  Take 1 tablet by mouth daily.     omeprazole 20 MG capsule  Commonly known as:  PRILOSEC  TAKE 1 CAPSULE DAILY     ondansetron 4 MG tablet  Commonly known as:  ZOFRAN  Take 1 tablet (4 mg total) by mouth every 6 (six) hours as needed for nausea.     oseltamivir 75 MG capsule  Commonly known as:  TAMIFLU  Take 1 capsule (75 mg total) by mouth daily.     oxyCODONE 5 MG immediate release tablet  Commonly known as:  Oxy IR/ROXICODONE  Take 1-2 tablets (5-10 mg total) by mouth every 3 (three) hours as needed for breakthrough pain.     OxyCODONE 10 mg T12a 12 hr tablet  Commonly known as:  OXYCONTIN  Take 1 tablet (10 mg total) by mouth every 12 (twelve) hours.     PROTOPIC 0.1 % ointment  Generic drug:  tacrolimus  Apply 1 application topically 2 (two) times daily as needed.     simvastatin 20 MG tablet  Commonly known as:  ZOCOR  TAKE 1 TABLET AT BEDTIME        FOLLOW UP VISIT:       Follow-up Information    Follow up with [facility].   Why:  Someone from [facility] will contact you concerning start date and time for therapy.   Contact information:   [address] [place] [address] [phone]       Follow up with Raymon MuttonLUCEY,STEPHEN D, MD. Call on 02/12/2015.   Specialty:  Orthopedic Surgery   Contact information:   [address] [place] [address] [phone]       DISPOSITION: HOME   CONDITION:  Good   Tacy Chavis 02/04/2015, 9:44 AM

## 2015-10-15 ENCOUNTER — Other Ambulatory Visit: Payer: Self-pay

## 2015-10-15 DIAGNOSIS — Z1231 Encounter for screening mammogram for malignant neoplasm of breast: Secondary | ICD-10-CM

## 2015-10-24 ENCOUNTER — Ambulatory Visit
Admission: RE | Admit: 2015-10-24 | Discharge: 2015-10-24 | Disposition: A | Discharge status: Home / Self Care | Payer: 59 | Source: Ambulatory Visit

## 2015-10-24 DIAGNOSIS — Z1231 Encounter for screening mammogram for malignant neoplasm of breast: Secondary | ICD-10-CM

## 2015-10-28 ENCOUNTER — Other Ambulatory Visit: Payer: Self-pay | Admitting: Family Medicine

## 2015-10-28 DIAGNOSIS — R928 Other abnormal and inconclusive findings on diagnostic imaging of breast: Secondary | ICD-10-CM

## 2015-11-01 ENCOUNTER — Ambulatory Visit
Admission: RE | Admit: 2015-11-01 | Discharge: 2015-11-01 | Disposition: A | Discharge status: Home / Self Care | Payer: 59 | Source: Ambulatory Visit | Attending: Family Medicine | Admitting: Family Medicine

## 2015-11-01 DIAGNOSIS — R928 Other abnormal and inconclusive findings on diagnostic imaging of breast: Secondary | ICD-10-CM

## 2016-04-04 ENCOUNTER — Ambulatory Visit (INDEPENDENT_AMBULATORY_CARE_PROVIDER_SITE_OTHER): Payer: 59 | Admitting: Physician Assistant

## 2016-04-04 VITALS — BP 142/80 | HR 121 | Temp 102.6°F | Resp 17 | Ht 60.0 in | Wt 177.4 lb

## 2016-04-04 DIAGNOSIS — R350 Frequency of micturition: Secondary | ICD-10-CM

## 2016-04-04 DIAGNOSIS — R300 Dysuria: Secondary | ICD-10-CM | POA: Diagnosis not present

## 2016-04-04 DIAGNOSIS — R509 Fever, unspecified: Secondary | ICD-10-CM | POA: Diagnosis not present

## 2016-04-04 LAB — POCT URINALYSIS DIP (MANUAL ENTRY)
BILIRUBIN UA: NEGATIVE
BILIRUBIN UA: NEGATIVE
Glucose, UA: NEGATIVE
Nitrite, UA: NEGATIVE
Protein Ur, POC: 30 — AB
Spec Grav, UA: 1.02
Urobilinogen, UA: 0.2
pH, UA: 7.5

## 2016-04-04 LAB — POCT CBC
GRANULOCYTE PERCENT: 82.6 % — AB (ref 37–80)
HCT, POC: 39.2 % (ref 37.7–47.9)
Hemoglobin: 13.9 g/dL (ref 12.2–16.2)
Lymph, poc: 2.8 (ref 0.6–3.4)
MCH: 33 pg — AB (ref 27–31.2)
MCHC: 35.6 g/dL — AB (ref 31.8–35.4)
MCV: 92.8 fL (ref 80–97)
MID (CBC): 0.7 (ref 0–0.9)
MPV: 7.8 fL (ref 0–99.8)
PLATELET COUNT, POC: 315 10*3/uL (ref 142–424)
POC Granulocyte: 16.4 — AB (ref 2–6.9)
POC LYMPH %: 14 % (ref 10–50)
POC MID %: 3.4 % (ref 0–12)
RBC: 4.22 M/uL (ref 4.04–5.48)
RDW, POC: 12.5 %
WBC: 19.9 10*3/uL — AB (ref 4.6–10.2)

## 2016-04-04 LAB — POC MICROSCOPIC URINALYSIS (UMFC): Mucus: ABSENT

## 2016-04-04 MED ORDER — SULFAMETHOXAZOLE-TRIMETHOPRIM 800-160 MG PO TABS: 1.0000 | ORAL_TABLET | Freq: Two times a day (BID) | ORAL | Status: DC

## 2016-04-04 MED ORDER — CEFTRIAXONE SODIUM 1 G IJ SOLR
1.0000 g | Freq: Once | INTRAMUSCULAR | Status: AC
  Administered 2016-04-04: 1 g via INTRAMUSCULAR

## 2016-04-04 MED ORDER — ONDANSETRON 8 MG PO TBDP: 8.0000 mg | ORAL_TABLET | Freq: Three times a day (TID) | ORAL | Status: DC | PRN

## 2016-04-04 MED ORDER — IBUPROFEN 200 MG PO TABS
600.0000 mg | ORAL_TABLET | Freq: Once | ORAL | Status: AC
  Administered 2016-04-04: 600 mg via ORAL

## 2016-04-04 NOTE — Progress Notes (Signed)
Patient ID: Pamela Malone, female     DOB: [birth year], 65 y.o.    MRN: [id number]  PCP: Gaye Alken, MD  Chief Complaint  Patient presents with  . Urinary Tract Infection    Started earlier this week with burning with urination, & urgency  . Fever    started last night    Subjective:    HPI  Presents for evaluation of fever and urinary frequency and burning. She is accompanied by her husband.  Increased urinary frequency and dysuria developed several days ago. Last night, she developed fever and chills. Took some acetaminophen. Was restless over night. Nausea and dry heaves upon arriving here this morning. "I just feel funny all over." No abdominal pain or back pain. No previous illness like this.     Prior to Admission medications   Medication Sig Start Date End Date Taking? Authorizing Provider  Adalimumab (HUMIRA PEN [place]) Inject 40 mg into the skin every 14 (fourteen) days.    Yes Historical Provider, MD  cetirizine (ZYRTEC) 10 MG tablet Take 10 mg by mouth daily as needed.    Yes Historical Provider, MD  citalopram (CELEXA) 20 MG tablet  02/12/16  Yes Historical Provider, MD  clobetasol ointment (TEMOVATE) 0.05 % APPLY 1 APPLICATION TO AFFECTED AREA TWICE A DAY WHEN FLARING EXTERNALLY TO HANDS 14 DAYS 02/13/16  Yes Historical Provider, MD  enoxaparin (LOVENOX) 40 MG/0.4ML injection Inject 0.4 mLs (40 mg total) into the skin daily. 01/29/15  Yes Altamese Cabal, PA-C  meloxicam (MOBIC) 15 MG tablet Take 7.5 mg by mouth 2 (two) times daily.  05/16/11  Yes Historical Provider, MD  methocarbamol (ROBAXIN) 500 MG tablet Take 1-2 tablets (500-1,000 mg total) by mouth every 6 (six) hours as needed for muscle spasms. 01/29/15  Yes Altamese Cabal, PA-C  Olmesartan-Amlodipine-HCTZ (TRIBENZOR) 40-5-25 MG TABS Take 1 tablet by mouth daily. 05/24/14  Yes Kendrick Ranch, MD  omeprazole (PRILOSEC) 20 MG capsule TAKE 1 CAPSULE DAILY 02/12/14  Yes Kendrick Ranch, MD    ondansetron (ZOFRAN) 4 MG tablet Take 1 tablet (4 mg total) by mouth every 6 (six) hours as needed for nausea. 01/29/15  Yes Altamese Cabal, PA-C  oxyCODONE (OXY IR/ROXICODONE) 5 MG immediate release tablet Take 1-2 tablets (5-10 mg total) by mouth every 3 (three) hours as needed for breakthrough pain. 01/29/15  Yes Altamese Cabal, PA-C  OxyCODONE (OXYCONTIN) 10 mg T12A 12 hr tablet Take 1 tablet (10 mg total) by mouth every 12 (twelve) hours. 01/29/15  Yes Altamese Cabal, PA-C  PROTOPIC 0.1 % ointment Apply 1 application topically 2 (two) times daily as needed. 11/05/11  Yes Historical Provider, MD  simvastatin (ZOCOR) 20 MG tablet TAKE 1 TABLET AT BEDTIME 05/24/14  Yes Kendrick Ranch, MD  amoxicillin (AMOXIL) 500 MG capsule Reported on 04/04/2016 01/16/16   Historical Provider, MD     No Known Allergies   Patient Active Problem List   Diagnosis Date Noted  . 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 Arthritis of knee, degenerative 01/25/2015  . History of hyperglycemia 02/20/2013  . 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 Shingles 02/23/2012  . 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 DJD (degenerative joint disease)   . Scoliosis   . Low back pain   . Depression   . GERD (gastroesophageal reflux disease)   . Hyperlipidemia   . Hypertension   . Menopause   . Psoriasis      Family History  Problem Relation Age of Onset  .  Hypertension Mother   . Stroke Mother   . Diabetes Paternal Grandmother   . Alzheimer's disease Mother   . Parkinsonism Paternal Grandmother      Social History   Social History  . Marital Status: Married    Spouse Name: Romeo Apple  . Number of Children: 2  . Years of Education: HS grad   Occupational History  . Retired    Social History Main Topics  . Smoking status: Never Smoker   . Smokeless tobacco: Never Used  . Alcohol Use: 2.0 oz/week    4 Standard drinks or equivalent per week  . Drug Use: No  . Sexual Activity: Yes   Other Topics Concern  . Not on file    Social History Narrative   Lives with her husband.        Review of Systems  Constitutional: Positive for fever, chills and diaphoresis.  Gastrointestinal: Positive for nausea. Negative for abdominal pain, diarrhea, abdominal distention and rectal pain. Vomiting: dry heaves.  Genitourinary: Positive for dysuria, urgency and frequency. Negative for hematuria and flank pain.  Musculoskeletal: Negative for back pain.  Skin: Negative for rash.         Objective:  Physical Exam  Constitutional: She is oriented to person, place, and time. She appears well-developed and well-nourished. She is active and cooperative. No distress.  BP 142/80 mmHg  Pulse 121  Temp(Src) 102.6 F (39.2 C) (Oral)  Resp 17  Ht 5' (1.524 m)  Wt 177 lb 6 oz (80.457 kg)  BMI 34.64 kg/m2  SpO2 94%  HENT:  Head: Normocephalic and atraumatic.  Right Ear: Hearing normal.  Left Ear: Hearing normal.  Eyes: Conjunctivae are normal. No scleral icterus.  Neck: Normal range of motion. Neck supple. No thyromegaly present.  Cardiovascular: Normal rate, regular rhythm and normal heart sounds.   Pulses:      Radial pulses are 2+ on the right side, and 2+ on the left side.  Pulmonary/Chest: Effort normal and breath sounds normal.  Abdominal: Normal appearance and bowel sounds are normal. There is no hepatosplenomegaly. There is tenderness (mild LLQ, not reproducible). There is no CVA tenderness.  Lymphadenopathy:       Head (right side): No tonsillar, no preauricular, no posterior auricular and no occipital adenopathy present.       Head (left side): No tonsillar, no preauricular, no posterior auricular and no occipital adenopathy present.    She has no cervical adenopathy.       Right: No supraclavicular adenopathy present.       Left: No supraclavicular adenopathy present.  Neurological: She is alert and oriented to person, place, and time. No sensory deficit.  Skin: Skin is warm, dry and intact. No rash  noted. No cyanosis or erythema. Nails show no clubbing.  Psychiatric: She has a normal mood and affect. Her speech is normal and behavior is normal.     Results for orders placed or performed in visit on 04/04/16  POCT Microscopic Urinalysis ([facility])  Result Value Ref Range   WBC,UR,HPF,POC Too numerous to count  (A) None WBC/hpf   RBC,UR,HPF,POC Few (A) None RBC/hpf   Bacteria Moderate (A) None, Too numerous to count   Mucus Absent Absent   Epithelial Cells, UR Per Microscopy Few (A) None, Too numerous to count cells/hpf  POCT urinalysis dipstick  Result Value Ref Range   Color, UA yellow yellow   Clarity, UA hazy (A) clear   Glucose, UA negative negative   Bilirubin, UA negative negative  Ketones, POC UA negative negative   Spec Grav, UA 1.020    Blood, UA small (A) negative   pH, UA 7.5    Protein Ur, POC =30 (A) negative   Urobilinogen, UA 0.2    Nitrite, UA Negative Negative   Leukocytes, UA large (3+) (A) Negative  POCT CBC  Result Value Ref Range   WBC 19.9 (A) 4.6 - 10.2 K/uL   Lymph, poc 2.8 0.6 - 3.4   POC LYMPH PERCENT 14.0 10 - 50 %L   MID (cbc) 0.7 0 - 0.9   POC MID % 3.4 0 - 12 %M   POC Granulocyte 16.4 (A) 2 - 6.9   Granulocyte percent 82.6 (A) 37 - 80 %G   RBC 4.22 4.04 - 5.48 M/uL   Hemoglobin 13.9 12.2 - 16.2 g/dL   HCT, POC 96.039.2 45.437.7 - 47.9 %   MCV 92.8 80 - 97 fL   MCH, POC 33.0 (A) 27 - 31.2 pg   MCHC 35.6 (A) 31.8 - 35.4 g/dL   RDW, POC 09.812.5 %   Platelet Count, POC 315 142 - 424 K/uL   MPV 7.8 0 - 99.8 fL            Assessment & Plan:  1. Burning with urination 2. Frequency of urination 3. Fever, unspecified Suspect pyelonephritis, despite lack of CVA tenderness. Await UCX. Rocephin here, then start Septra DS orally. RTC 2 days for re-evaluation and CBC. If symptoms worsen before then, go to the ED. - POCT Microscopic Urinalysis ([facility]) - POCT urinalysis dipstick - POCT CBC - Urine culture - ibuprofen (ADVIL,MOTRIN) tablet 600 mg; Take  3 tablets (600 mg total) by mouth once. - cefTRIAXone (ROCEPHIN) injection 1 g; Inject 1 g into the muscle once. - sulfamethoxazole-trimethoprim (BACTRIM DS,SEPTRA DS) 800-160 MG tablet; Take 1 tablet by mouth 2 (two) times daily.  Dispense: 20 tablet; Refill: 0 - ondansetron (ZOFRAN-ODT) 8 MG disintegrating tablet; Take 1 tablet (8 mg total) by mouth every 8 (eight) hours as needed for nausea.  Dispense: 20 tablet; Refill: 0   Fernande Brashelle S. Keren Alverio, PA-C Physician Assistant-Certified [facility]

## 2016-04-04 NOTE — Patient Instructions (Addendum)
Get plenty of rest and drink at least 64 ounces of water daily. Use ibuprofen and/or acetaminophen for fever and pain as needed.  If you worsen overnight/tomorrow, please go to the emergency department.    IF you received an x-ray today, you will receive an invoice from [facility]. Please contact [facility] at [phone] with questions or concerns regarding your invoice.   IF you received labwork today, you will receive an invoice from [employer]. Please contact [facility] at [phone] with questions or concerns regarding your invoice.   Our billing staff will not be able to assist you with questions regarding bills from these companies.  You will be contacted with the lab results as soon as they are available. The fastest way to get your results is to activate your My Chart account. Instructions are located on the last page of this paperwork. If you have not heard from [place] regarding the results in 2 weeks, please contact this office.    Urinary Tract Infection Urinary tract infections (UTIs) can develop anywhere along your urinary tract. Your urinary tract is your body's drainage system for removing wastes and extra water. Your urinary tract includes two kidneys, two ureters, a bladder, and a urethra. Your kidneys are a pair of bean-shaped organs. Each kidney is about the size of your fist. They are located below your ribs, one on each side of your spine. CAUSES Infections are caused by microbes, which are microscopic organisms, including fungi, viruses, and bacteria. These organisms are so small that they can only be seen through a microscope. Bacteria are the microbes that most commonly cause UTIs. SYMPTOMS  Symptoms of UTIs may vary by age and gender of the patient and by the location of the infection. Symptoms in young women typically include a frequent and intense urge to urinate and a painful, burning feeling in the bladder or urethra  during urination. Older women and men are more likely to be tired, shaky, and weak and have muscle aches and abdominal pain. A fever may mean the infection is in your kidneys. Other symptoms of a kidney infection include pain in your back or sides below the ribs, nausea, and vomiting. DIAGNOSIS To diagnose a UTI, your caregiver will ask you about your symptoms. Your caregiver will also ask you to provide a urine sample. The urine sample will be tested for bacteria and white blood cells. White blood cells are made by your body to help fight infection. TREATMENT  Typically, UTIs can be treated with medication. Because most UTIs are caused by a bacterial infection, they usually can be treated with the use of antibiotics. The choice of antibiotic and length of treatment depend on your symptoms and the type of bacteria causing your infection. HOME CARE INSTRUCTIONS  If you were prescribed antibiotics, take them exactly as your caregiver instructs you. Finish the medication even if you feel better after you have only taken some of the medication.  Drink enough water and fluids to keep your urine clear or pale yellow.  Avoid caffeine, tea, and carbonated beverages. They tend to irritate your bladder.  Empty your bladder often. Avoid holding urine for long periods of time.  Empty your bladder before and after sexual intercourse.  After a bowel movement, women should cleanse from front to back. Use each tissue only once. SEEK MEDICAL CARE IF:   You have back pain.  You develop a fever.  Your symptoms do not begin to resolve within 3 days. SEEK IMMEDIATE  MEDICAL CARE IF:   You have severe back pain or lower abdominal pain.  You develop chills.  You have nausea or vomiting.  You have continued burning or discomfort with urination. MAKE SURE YOU:   Understand these instructions.  Will watch your condition.  Will get help right away if you are not doing well or get worse.   This  information is not intended to replace advice given to you by your health care provider. Make sure you discuss any questions you have with your health care provider.   Document Released: 06/24/2005 Document Revised: 06/05/2015 Document Reviewed: 10/23/2011 Elsevier Interactive Patient Education [employer]2016 [employer]

## 2016-04-06 ENCOUNTER — Ambulatory Visit (INDEPENDENT_AMBULATORY_CARE_PROVIDER_SITE_OTHER): Payer: 59 | Admitting: Family Medicine

## 2016-04-06 VITALS — BP 118/76 | HR 90 | Temp 98.3°F | Resp 18 | Ht 60.0 in | Wt 179.0 lb

## 2016-04-06 DIAGNOSIS — L409 Psoriasis, unspecified: Secondary | ICD-10-CM

## 2016-04-06 DIAGNOSIS — Z5181 Encounter for therapeutic drug level monitoring: Secondary | ICD-10-CM

## 2016-04-06 DIAGNOSIS — N10 Acute tubulo-interstitial nephritis: Secondary | ICD-10-CM

## 2016-04-06 LAB — POCT CBC
Granulocyte percent: 66.2 %G (ref 37–80)
HCT, POC: 37.3 % — AB (ref 37.7–47.9)
HEMOGLOBIN: 13.2 g/dL (ref 12.2–16.2)
LYMPH, POC: 3.4 (ref 0.6–3.4)
MCH: 33 pg — AB (ref 27–31.2)
MCHC: 35.4 g/dL (ref 31.8–35.4)
MCV: 93.3 fL (ref 80–97)
MID (cbc): 1.3 — AB (ref 0–0.9)
MPV: 7.7 fL (ref 0–99.8)
PLATELET COUNT, POC: 292 10*3/uL (ref 142–424)
POC Granulocyte: 9.3 — AB (ref 2–6.9)
POC LYMPH PERCENT: 24.6 %L (ref 10–50)
POC MID %: 9.2 % (ref 0–12)
RBC: 3.99 M/uL — AB (ref 4.04–5.48)
RDW, POC: 12.4 %
WBC: 14 10*3/uL — AB (ref 4.6–10.2)

## 2016-04-06 NOTE — Patient Instructions (Addendum)
IF you received an x-ray today, you will receive an invoice from [facility]. Please contact [facility] at [phone] with questions or concerns regarding your invoice.   IF you received labwork today, you will receive an invoice from [employer]. Please contact [facility] at [phone] with questions or concerns regarding your invoice.   Our billing staff will not be able to assist you with questions regarding bills from these companies.  You will be contacted with the lab results as soon as they are available. The fastest way to get your results is to activate your My Chart account. Instructions are located on the last page of this paperwork. If you have not heard from [place] regarding the results in 2 weeks, please contact this office.    I'm glad you're feeling better.  You should continue to improve at this point. Continue antibiotics until they're completed. If any fevers, abdominal pain, or worsening symptoms, recommend you recheck here or other medical care provider.  I did check quantiferon gold and CMP for monitoring as requested by your dermatologist. Try to sign up for Mychart for easiest access to these results.   Pyelonephritis, Adult Pyelonephritis is a kidney infection. The kidneys are the organs that filter a person's blood and move waste out of the bloodstream and into the urine. Urine passes from the kidneys, through the ureters, and into the bladder. There are two main types of pyelonephritis:  Infections that come on quickly without any warning (acute pyelonephritis).  Infections that last for a long period of time (chronic pyelonephritis). In most cases, the infection clears up with treatment and does not cause further problems. More severe infections or chronic infections can sometimes spread to the bloodstream or lead to other problems with the kidneys. CAUSES This condition is usually caused by:  Bacteria traveling  from the bladder to the kidney through infected urine. The urine in the bladder can become infected with bacteria from:  Bladder infection (cystitis).  Inflammation of the prostate gland (prostatitis).  Sexual intercourse, in females.  Bacteria traveling from the bloodstream to the kidney. RISK FACTORS This condition is more likely to develop in:  Pregnant women.  Older people.  People who have diabetes.  People who have kidney stones or bladder stones.  People who have other abnormalities of the kidney or ureter.  People who have a catheter placed in the bladder.  People who have cancer.  People who are sexually active.  Women who use spermicides.  People who have had a prior urinary tract infection. SYMPTOMS Symptoms of this condition include:  Frequent urination.  Strong or persistent urge to urinate.  Burning or stinging when urinating.  Abdominal pain.  Back pain.  Pain in the side or flank area.  Fever.  Chills.  Blood in the urine, or dark urine.  Nausea.  Vomiting. DIAGNOSIS This condition may be diagnosed based on:  Medical history and physical exam.  Urine tests.  Blood tests. You may also have imaging tests of the kidneys, such as an ultrasound or CT scan. TREATMENT Treatment for this condition may depend on the severity of the infection.  If the infection is mild and is found early, you may be treated with antibiotic medicines taken by mouth. You will need to drink fluids to remain hydrated.  If the infection is more severe, you may need to stay in the hospital and receive antibiotics given directly into a vein through an IV tube. You may also  need to receive fluids through an IV tube if you are not able to remain hydrated. After your hospital stay, you may need to take oral antibiotics for a period of time. Other treatments may be required, depending on the cause of the infection. HOME CARE INSTRUCTIONS Medicines  Take  over-the-counter and prescription medicines only as told by your health care provider.  If you were prescribed an antibiotic medicine, take it as told by your health care provider. Do not stop taking the antibiotic even if you start to feel better. General Instructions  Drink enough fluid to keep your urine clear or pale yellow.  Avoid caffeine, tea, and carbonated beverages. They tend to irritate the bladder.  Urinate often. Avoid holding in urine for long periods of time.  Urinate before and after sex.  After a bowel movement, women should cleanse from front to back. Use each tissue only once.  Keep all follow-up visits as told by your health care provider. This is important. SEEK MEDICAL CARE IF:  Your symptoms do not get better after 2 days of treatment.  Your symptoms get worse.  You have a fever. SEEK IMMEDIATE MEDICAL CARE IF:  You are unable to take your antibiotics or fluids.  You have shaking chills.  You vomit.  You have severe flank or back pain.  You have extreme weakness or fainting.   This information is not intended to replace advice given to you by your health care provider. Make sure you discuss any questions you have with your health care provider.   Document Released: 09/14/2005 Document Revised: 06/05/2015 Document Reviewed: 01/07/2015 Elsevier Interactive Patient Education [employer]2016 Elsevier Inc.

## 2016-04-06 NOTE — Progress Notes (Addendum)
Subjective:  By signing my name below, I, Stann Oresung-Kai Tsai, attest that this documentation has been prepared under the direction and in the presence of Meredith StaggersJeffrey Selenne Coggin, MD. Electronically Signed: Stann Oresung-Kai Tsai, Scribe. 04/06/2016 , 6:36 PM .  Patient was seen in Room 11 .   Patient ID: Pamela Malone, female    DOB: 1951/03/23, 65 y.o.   MRN: [id number] Chief Complaint  Patient presents with  . Follow-up    UTI   HPI Pamela Malone is a 65 y.o. female Here for follow up on her UTI, seen by Porfirio Oarhelle Jeffery, PA-C 2 days ago. At that time, her temperature was 102.6 and was tachycardic. She complained of urinary frequency and dysuria for several days with nausea and dry heaving that morning. Her WBC was 19.9, suspected pyelonephritis. She was given rocephin 1g, and started on bactrim septra. She was also given zofran for nausea and vomiting, and ibuprofen for fever. Her urine culture so far only indicates gram negative rods but colony count >100k.   She reports feeling much better. She had a slight fever yesterday but feels much better today. She denies nausea or vomiting. She also denies urinary symptoms including frequency or dysuria. She denies any complications with bactrim.   She also requests labs (TB gold and CMP) for medication monitoring being done by her dermatologist, Dr. Danella DeisGruber. She is followed for psoriasis over her hands and her legs. She had negative TB tests in the past.   Patient Active Problem List   Diagnosis Date Noted  . 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 Arthritis of knee, degenerative 01/25/2015  . History of hyperglycemia 02/20/2013  . 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 Shingles 02/23/2012  . 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 DJD (degenerative joint disease)   . Scoliosis   . Low back pain   . Depression   . GERD (gastroesophageal reflux disease)   . Hyperlipidemia   . Hypertension   . Menopause   . Psoriasis    Past Medical History  Diagnosis Date  .  Scoliosis   . Menopause   . Psoriasis   . Low back pain   . DJD (degenerative joint disease)   . GERD (gastroesophageal reflux disease)   . Hyperlipidemia   . Hypertension   . History of right bundle branch block (RBBB)     released after cardiac evaluation by Dr Donnie Ahoilley 12/2014; gave surgical clearance  . Depression     many years ago  . Nocturia   . Cataract     immature   Past Surgical History  Procedure Laterality Date  . Carpal tunnel release  bilateral  . Dilation and curettage of uterus  2009  . Ankle surgery Right     from a previous injury as a child   . Total knee arthroplasty Left 01/28/2015    Procedure: TOTAL KNEE ARTHROPLASTY;  Surgeon: Dannielle HuhSteve Lucey, MD;  Location: [facility] OR;  Service: Orthopedics;  Laterality: Left;   No Known Allergies Prior to Admission medications   Medication Sig Start Date End Date Taking? Authorizing Provider  Adalimumab (HUMIRA PEN [place]) Inject 40 mg into the skin every 14 (fourteen) days.    Yes Historical Provider, MD  cetirizine (ZYRTEC) 10 MG tablet Take 10 mg by mouth daily as needed.    Yes Historical Provider, MD  citalopram (CELEXA) 20 MG tablet  02/12/16  Yes Historical Provider, MD  clobetasol ointment (TEMOVATE) 0.05 % APPLY 1 APPLICATION TO AFFECTED AREA TWICE A DAY WHEN  FLARING EXTERNALLY TO HANDS 14 DAYS 02/13/16  Yes Historical Provider, MD  enoxaparin (LOVENOX) 40 MG/0.4ML injection Inject 0.4 mLs (40 mg total) into the skin daily. 01/29/15  Yes Altamese Cabal, PA-C  meloxicam (MOBIC) 15 MG tablet Take 7.5 mg by mouth 2 (two) times daily.  05/16/11  Yes Historical Provider, MD  Olmesartan-Amlodipine-HCTZ (TRIBENZOR) 40-5-25 MG TABS Take 1 tablet by mouth daily. 05/24/14  Yes Kendrick Ranch, MD  omeprazole (PRILOSEC) 20 MG capsule TAKE 1 CAPSULE DAILY 02/12/14  Yes Kendrick Ranch, MD  PROTOPIC 0.1 % ointment Apply 1 application topically 2 (two) times daily as needed. 11/05/11  Yes Historical Provider, MD  simvastatin (ZOCOR) 20 MG  tablet TAKE 1 TABLET AT BEDTIME 05/24/14  Yes Kendrick Ranch, MD  sulfamethoxazole-trimethoprim (BACTRIM DS,SEPTRA DS) 800-160 MG tablet Take 1 tablet by mouth 2 (two) times daily. 04/04/16 04/14/16 Yes Chelle Jeffery, PA-C  methocarbamol (ROBAXIN) 500 MG tablet Take 1-2 tablets (500-1,000 mg total) by mouth every 6 (six) hours as needed for muscle spasms. Patient not taking: Reported on 04/06/2016 01/29/15   Altamese Cabal, PA-C  ondansetron (ZOFRAN) 4 MG tablet Take 1 tablet (4 mg total) by mouth every 6 (six) hours as needed for nausea. Patient not taking: Reported on 04/06/2016 01/29/15   Altamese Cabal, PA-C  ondansetron (ZOFRAN-ODT) 8 MG disintegrating tablet Take 1 tablet (8 mg total) by mouth every 8 (eight) hours as needed for nausea. Patient not taking: Reported on 04/06/2016 04/04/16   Porfirio Oar, PA-C  oxyCODONE (OXY IR/ROXICODONE) 5 MG immediate release tablet Take 1-2 tablets (5-10 mg total) by mouth every 3 (three) hours as needed for breakthrough pain. Patient not taking: Reported on 04/06/2016 01/29/15   Altamese Cabal, PA-C  OxyCODONE (OXYCONTIN) 10 mg T12A 12 hr tablet Take 1 tablet (10 mg total) by mouth every 12 (twelve) hours. Patient not taking: Reported on 04/06/2016 01/29/15   Altamese Cabal, PA-C   Social History   Social History  . Marital Status: Married    Spouse Name: Romeo Apple  . Number of Children: 2  . Years of Education: HS grad   Occupational History  . Retired    Social History Main Topics  . Smoking status: Never Smoker   . Smokeless tobacco: Never Used  . Alcohol Use: 2.0 oz/week    4 Standard drinks or equivalent per week  . Drug Use: No  . Sexual Activity: Yes   Other Topics Concern  . Not on file   Social History Narrative   Lives with her husband.   Review of Systems  Constitutional: Negative for fever, chills, fatigue and unexpected weight change.  Respiratory: Negative for cough.   Gastrointestinal: Negative for nausea, vomiting, diarrhea and  constipation.  Genitourinary: Negative for dysuria and frequency.  Skin: Negative for rash and wound.  Neurological: Negative for dizziness, weakness and headaches.       Objective:   Physical Exam  Constitutional: She is oriented to person, place, and time. She appears well-developed and well-nourished. No distress.  HENT:  Head: Normocephalic and atraumatic.  Eyes: EOM are normal. Pupils are equal, round, and reactive to light.  Neck: Neck supple.  Cardiovascular: Normal rate.   Pulmonary/Chest: Effort normal and breath sounds normal. No respiratory distress.  Abdominal: Soft. Bowel sounds are normal. She exhibits no distension. There is no tenderness. There is no CVA tenderness.  Musculoskeletal: Normal range of motion.  Neurological: She is alert and oriented to person, place, and time.  Skin: Skin is  warm and dry.  Psychiatric: She has a normal mood and affect. Her behavior is normal.  Nursing note and vitals reviewed.  Filed Vitals:   04/06/16 1751  BP: 118/76  Pulse: 90  Temp: 98.3 F (36.8 C)  TempSrc: Oral  Resp: 18  Height: 5' (1.524 m)  Weight: 179 lb (81.194 kg)  SpO2: 98%   Results for orders placed or performed in visit on 04/06/16  POCT CBC  Result Value Ref Range   WBC 14.0 (A) 4.6 - 10.2 K/uL   Lymph, poc 3.4 0.6 - 3.4   POC LYMPH PERCENT 24.6 10 - 50 %L   MID (cbc) 1.3 (A) 0 - 0.9   POC MID % 9.2 0 - 12 %M   POC Granulocyte 9.3 (A) 2 - 6.9   Granulocyte percent 66.2 37 - 80 %G   RBC 3.99 (A) 4.04 - 5.48 M/uL   Hemoglobin 13.2 12.2 - 16.2 g/dL   HCT, POC 16.1 (A) 09.6 - 47.9 %   MCV 93.3 80 - 97 fL   MCH, POC 33.0 (A) 27 - 31.2 pg   MCHC 35.4 31.8 - 35.4 g/dL   RDW, POC 04.5 %   Platelet Count, POC 292 142 - 424 K/uL   MPV 7.7 0 - 99.8 fL       Assessment & Plan:   Pamela Malone is a 65 y.o. female Acute pyelonephritis - Plan:   Clinically much improved. CBC improving, afebrile, no abdominal or CVA tenderness.  -Continue Septra DS,  final urine culture still pending.   -Continue fluids, and RTC precautions discussed.   Psoriasis, Encounter for medication monitoring - Plan: COMPLETE METABOLIC PANEL WITH GFR, Quantiferon tb gold assay  - CMP and QuantiFERON gold drawn for her dermatologist for monitoring with Humira injections. Advised to sign up for Mychart for easiest way to receive these results. We can also fax these to Dr. Danella Deis when they're available.  No orders of the defined types were placed in this encounter.   Patient Instructions       IF you received an x-ray today, you will receive an invoice from [facility]. Please contact [facility] at [phone] with questions or concerns regarding your invoice.   IF you received labwork today, you will receive an invoice from [employer]. Please contact [facility] at [phone] with questions or concerns regarding your invoice.   Our billing staff will not be able to assist you with questions regarding bills from these companies.  You will be contacted with the lab results as soon as they are available. The fastest way to get your results is to activate your My Chart account. Instructions are located on the last page of this paperwork. If you have not heard from [place] regarding the results in 2 weeks, please contact this office.    I'm glad you're feeling better.  You should continue to improve at this point. Continue antibiotics until they're completed. If any fevers, abdominal pain, or worsening symptoms, recommend you recheck here or other medical care provider.  I did check quantiferon gold and CMP for monitoring as requested by your dermatologist. Try to sign up for Mychart for easiest access to these results.   Pyelonephritis, Adult Pyelonephritis is a kidney infection. The kidneys are the organs that filter a person's blood and move waste out of the bloodstream and into the urine. Urine passes from the kidneys,  through the ureters, and into the bladder. There are two main types  of pyelonephritis:  Infections that come on quickly without any warning (acute pyelonephritis).  Infections that last for a long period of time (chronic pyelonephritis). In most cases, the infection clears up with treatment and does not cause further problems. More severe infections or chronic infections can sometimes spread to the bloodstream or lead to other problems with the kidneys. CAUSES This condition is usually caused by:  Bacteria traveling from the bladder to the kidney through infected urine. The urine in the bladder can become infected with bacteria from:  Bladder infection (cystitis).  Inflammation of the prostate gland (prostatitis).  Sexual intercourse, in females.  Bacteria traveling from the bloodstream to the kidney. RISK FACTORS This condition is more likely to develop in:  Pregnant women.  Older people.  People who have diabetes.  People who have kidney stones or bladder stones.  People who have other abnormalities of the kidney or ureter.  People who have a catheter placed in the bladder.  People who have cancer.  People who are sexually active.  Women who use spermicides.  People who have had a prior urinary tract infection. SYMPTOMS Symptoms of this condition include:  Frequent urination.  Strong or persistent urge to urinate.  Burning or stinging when urinating.  Abdominal pain.  Back pain.  Pain in the side or flank area.  Fever.  Chills.  Blood in the urine, or dark urine.  Nausea.  Vomiting. DIAGNOSIS This condition may be diagnosed based on:  Medical history and physical exam.  Urine tests.  Blood tests. You may also have imaging tests of the kidneys, such as an ultrasound or CT scan. TREATMENT Treatment for this condition may depend on the severity of the infection.  If the infection is mild and is found early, you may be treated with antibiotic  medicines taken by mouth. You will need to drink fluids to remain hydrated.  If the infection is more severe, you may need to stay in the hospital and receive antibiotics given directly into a vein through an IV tube. You may also need to receive fluids through an IV tube if you are not able to remain hydrated. After your hospital stay, you may need to take oral antibiotics for a period of time. Other treatments may be required, depending on the cause of the infection. HOME CARE INSTRUCTIONS Medicines  Take over-the-counter and prescription medicines only as told by your health care provider.  If you were prescribed an antibiotic medicine, take it as told by your health care provider. Do not stop taking the antibiotic even if you start to feel better. General Instructions  Drink enough fluid to keep your urine clear or pale yellow.  Avoid caffeine, tea, and carbonated beverages. They tend to irritate the bladder.  Urinate often. Avoid holding in urine for long periods of time.  Urinate before and after sex.  After a bowel movement, women should cleanse from front to back. Use each tissue only once.  Keep all follow-up visits as told by your health care provider. This is important. SEEK MEDICAL CARE IF:  Your symptoms do not get better after 2 days of treatment.  Your symptoms get worse.  You have a fever. SEEK IMMEDIATE MEDICAL CARE IF:  You are unable to take your antibiotics or fluids.  You have shaking chills.  You vomit.  You have severe flank or back pain.  You have extreme weakness or fainting.   This information is not intended to replace advice given to you by  your health care provider. Make sure you discuss any questions you have with your health care provider.   Document Released: 09/14/2005 Document Revised: 06/05/2015 Document Reviewed: 01/07/2015 Elsevier Interactive Patient Education [employer]      I personally performed the services  described in this documentation, which was scribed in my presence. The recorded information has been reviewed and considered, and addended by me as needed.   Signed,   Meredith Staggers, MD [facility].  04/06/2016 8:15 PM

## 2016-04-07 LAB — COMPLETE METABOLIC PANEL WITHOUT GFR
ALT: 21 U/L (ref 6–29)
AST: 20 U/L (ref 10–35)
Albumin: 4.1 g/dL (ref 3.6–5.1)
Alkaline Phosphatase: 56 U/L (ref 33–130)
BUN: 21 mg/dL (ref 7–25)
CO2: 25 mmol/L (ref 20–31)
Calcium: 9.3 mg/dL (ref 8.6–10.4)
Chloride: 96 mmol/L — ABNORMAL LOW (ref 98–110)
Creat: 1.03 mg/dL — ABNORMAL HIGH (ref 0.50–0.99)
GFR, Est African American: 66 mL/min
GFR, Est Non African American: 58 mL/min — ABNORMAL LOW
Glucose, Bld: 92 mg/dL (ref 65–99)
Potassium: 4.2 mmol/L (ref 3.5–5.3)
Sodium: 133 mmol/L — ABNORMAL LOW (ref 135–146)
Total Bilirubin: 0.3 mg/dL (ref 0.2–1.2)
Total Protein: 6.7 g/dL (ref 6.1–8.1)

## 2016-04-07 LAB — URINE CULTURE: Colony Count: >=100000

## 2016-04-07 MED ORDER — CIPROFLOXACIN HCL 500 MG PO TABS: 500.0000 mg | ORAL_TABLET | Freq: Two times a day (BID) | ORAL | Status: AC

## 2016-04-07 NOTE — Addendum Note (Signed)
Addended by: Fernande BrasJEFFERY, Annaliz Aven S on: 04/07/2016 02:06 PM   Modules accepted: Orders, Medications, SmartSet

## 2016-04-09 LAB — QUANTIFERON TB GOLD ASSAY (BLOOD)
INTERFERON GAMMA RELEASE ASSAY: NEGATIVE
QUANTIFERON NIL VALUE: 0.04 [IU]/mL
Quantiferon Tb Ag Minus Nil Value: <0 IU/mL

## 2016-11-16 ENCOUNTER — Other Ambulatory Visit: Payer: Self-pay | Admitting: Family Medicine

## 2016-11-16 DIAGNOSIS — Z1231 Encounter for screening mammogram for malignant neoplasm of breast: Secondary | ICD-10-CM

## 2016-12-03 ENCOUNTER — Ambulatory Visit
Admission: RE | Admit: 2016-12-03 | Discharge: 2016-12-03 | Disposition: A | Discharge status: Home / Self Care | Payer: 59 | Source: Ambulatory Visit | Attending: Family Medicine | Admitting: Family Medicine

## 2016-12-03 DIAGNOSIS — Z1231 Encounter for screening mammogram for malignant neoplasm of breast: Secondary | ICD-10-CM

## 2017-08-09 ENCOUNTER — Ambulatory Visit (INDEPENDENT_AMBULATORY_CARE_PROVIDER_SITE_OTHER): Payer: 59 | Admitting: Podiatry

## 2017-08-09 ENCOUNTER — Ambulatory Visit (INDEPENDENT_AMBULATORY_CARE_PROVIDER_SITE_OTHER): Payer: 59

## 2017-08-09 ENCOUNTER — Encounter: Payer: Self-pay | Admitting: Podiatry

## 2017-08-09 DIAGNOSIS — M2040 Other hammer toe(s) (acquired), unspecified foot: Secondary | ICD-10-CM

## 2017-08-09 DIAGNOSIS — M21619 Bunion of unspecified foot: Secondary | ICD-10-CM | POA: Diagnosis not present

## 2017-08-09 NOTE — Patient Instructions (Addendum)
Hammer Toe Hammer toe is a change in the shape (a deformity) of your second, third, or fourth toe. The deformity causes the middle joint of your toe to stay bent. This causes pain, especially when you are wearing shoes. Hammer toe starts gradually. At first, the toe can be straightened. Gradually over time, the deformity becomes stiff and permanent. Early treatments to keep the toe straight may relieve pain. As the deformity becomes stiff and permanent, surgery may be needed to straighten the toe. What are the causes? Hammer toe is caused by abnormal bending of the toe joint that is closest to your foot. It happens gradually over time. This pulls on the muscles and connections (tendons) of the toe joint, making them weak and stiff. It is often related to wearing shoes that are too short or narrow and do not let your toes straighten. What increases the risk? You may be at greater risk for hammer toe if you:  Are female.  Are older.  Wear shoes that are too small.  Wear high-heeled shoes that pinch your toes.  Are a ballet dancer.  Have a second toe that is longer than your big toe (first toe).  Injure your foot or toe.  Have arthritis.  Have a family history of hammer toe.  Have a nerve or muscle disorder.  What are the signs or symptoms? The main symptoms of this condition are pain and deformity of the toe. The pain is worse when wearing shoes, walking, or running. Other symptoms may include:  Corns or calluses over the bent part of the toe or between the toes.  Redness and a burning feeling on the toe.  An open sore that forms on the top of the toe.  Not being able to straighten the toe.  How is this diagnosed? This condition is diagnosed based on your symptoms and a physical exam. During the exam, your health care provider will try to straighten your toe to see how stiff the deformity is. You may also have tests, such as:  A blood test to check for rheumatoid  arthritis.  An X-ray to show how severe the deformity is.  How is this treated? Treatment for this condition will depend on how stiff the deformity is. Surgery is often needed. However, sometimes a hammer toe can be straightened without surgery. Treatments that do not involve surgery include:  Taping the toe into a straightened position.  Using pads and cushions to protect the toe (orthotics).  Wearing shoes that provide enough room for the toes.  Doing toe-stretching exercises at home.  Taking an NSAID to reduce pain and swelling.  If these treatments do not help or the toe cannot be straightened, surgery is the next option. The most common surgeries used to straighten a hammer toe include:  Arthroplasty. In this procedure, part of the joint is removed, and that allows the toe to straighten.  Fusion. In this procedure, cartilage between the two bones of the joint is taken out and the bones are fused together into one longer bone.  Implantation. In this procedure, part of the bone is removed and replaced with an implant to let the toe move again.  Flexor tendon transfer. In this procedure, the tendons that curl the toes down (flexor tendons) are repositioned.  Follow these instructions at home:  Take over-the-counter and prescription medicines only as told by your health care provider.  Do toe straightening and stretching exercises as told by your health care provider.  Keep all   follow-up visits as told by your health care provider. This is important. How is this prevented?  Wear shoes that give your toes enough room and do not cause pain.  Do not wear high-heeled shoes. Contact a health care provider if:  Your pain gets worse.  Your toe becomes red or swollen.  You develop an open sore on your toe. This information is not intended to replace advice given to you by your health care provider. Make sure you discuss any questions you have with your health care  provider. Document Released: 09/11/2000 Document Revised: 04/03/2016 Document Reviewed: 01/08/2016 Elsevier Interactive Patient Education  2018 [employer].         Bunion A bunion is a bump on the base of the big toe that forms when the bones of the big toe joint move out of position. Bunions may be small at first, but they often get larger over time. The can make walking painful. What are the causes? A bunion may be caused by:  Wearing narrow or pointed shoes that force the big toe to press against the other toes.  Abnormal foot development that causes the foot to roll inward (pronate).  Changes in the foot that are caused by certain diseases, such as rheumatoid arthritis and polio.  A foot injury.  What increases the risk? The following factors may make you more likely to develop this condition:  Wearing shoes that squeeze the toes together.  Having certain diseases, such as: ? Rheumatoid arthritis. ? Polio. ? Cerebral palsy.  Having family members who have bunions.  Being born with a foot deformity, such as flat feet or low arches.  Doing activities that put a lot of pressure on the feet, such as ballet dancing.  What are the signs or symptoms? The main symptom of a bunion is a noticeable bump on the big toe. Other symptoms may include:  Pain.  Swelling around the big toe.  Redness and inflammation.  Thick or hardened skin on the big toe or between the toes.  Stiffness or loss of motion in the big toe.  Trouble with walking.  How is this diagnosed? A bunion may be diagnosed based on your symptoms, medical history, and activities. You may have tests, such as:  X-rays. These allow your health care provider to check the position of the bones in your foot and look for damage to your joint. They also help your health care provider to determine the severity of your bunion and the best way to treat it.  Joint aspiration. In this test, a sample of fluid is  removed from the toe joint. This test, which may be done if you are in a lot of pain, helps to rule out diseases that cause painful swelling of the joints, such as arthritis.  How is this treated? There is no cure for a bunion, but treatment can help to prevent a bunion from getting worse. Treatment depends on the severity of your symptoms. Your health care provider may recommend:  Wearing shoes that have a wide toe box.  Using bunion pads to cushion the affected area.  Taping your toes together to keep them in a normal position.  Placing a device inside your shoe (orthotics) to help reduce pressure on your toe joint.  Taking medicine to ease pain, inflammation, and swelling.  Applying heat or ice to the affected area.  Doing stretching exercises.  Surgery to remove scar tissue and move the toes back into their normal position. This  treatment is rare.  Follow these instructions at home:  Support your toe joint with proper footwear, shoe padding, or taping as told by your health care provider.  Take over-the-counter and prescription medicines only as told by your health care provider.  If directed, apply ice to the injured area: ? Put ice in a plastic bag. ? Place a towel between your skin and the bag. ? Leave the ice on for 20 minutes, 2-3 times per day.  If directed, apply heat to the affected area before you exercise. Use the heat source that your health care provider recommends, such as a moist heat pack or a heating pad. ? Place a towel between your skin and the heat source. ? Leave the heat on for 20-30 minutes. ? Remove the heat if your skin turns bright red. This is especially important if you are unable to feel pain, heat, or cold. You may have a greater risk of getting burned.  Do exercises as told by your health care provider.  Keep all follow-up visits as told by your health care provider. Contact a health care provider if:  Your symptoms get worse.  Your  symptoms do not improve in 2 weeks. Get help right away if:  You have severe pain and trouble with walking. This information is not intended to replace advice given to you by your health care provider. Make sure you discuss any questions you have with your health care provider. Document Released: 09/14/2005 Document Revised: 02/20/2016 Document Reviewed: 04/14/2015 Elsevier Interactive Patient Education  2018 [employer]  Pre-Operative Instructions  Congratulations, you have decided to take an important step towards improving your quality of life.  You can be assured that the doctors and staff at [facility] will be with you every step of the way.  Here are some important things you should know:  Plan to be at the surgery center/hospital at least 1 (one) hour prior to your scheduled time, unless otherwise directed by the surgical center/hospital staff.  You must have a responsible adult accompany you, remain during the surgery and drive you home.  Make sure you have directions to the surgical center/hospital to ensure you arrive on time. If you are having [facility] at [facility] or [facility] hospitals, you will need a copy of your medical history and physical form from your family physician within one month prior to the date of surgery. We will give you a form for your primary physician to complete.  We make every effort to accommodate the date you request for surgery.  However, there are times where surgery dates or times have to be moved.  We will contact you as soon as possible if a change in schedule is required.   No aspirin/ibuprofen for one week before surgery.  If you are on aspirin, any non-steroidal anti-inflammatory medications (Mobic, Aleve, Ibuprofen) should not be taken seven (7) days prior to your surgery.  You make take Tylenol for pain prior to surgery.  Medications - If you are taking daily heart and blood pressure medications, seizure, reflux, allergy, asthma, anxiety,  pain or diabetes medications, make sure you notify the surgery center/hospital before the day of surgery so they can tell you which medications you should take or avoid the day of surgery. No food or drink after midnight the night before surgery unless directed otherwise by surgical center/hospital staff. No alcoholic beverages 24-hours prior to surgery.  No smoking 24-hours prior or 24-hours after surgery. Wear loose pants or  shorts. They should be loose enough to fit over bandages, boots, and casts. Don't wear slip-on shoes. Sneakers are preferred. Bring your boot with you to the surgery center/hospital.  Also bring crutches or a walker if your physician has prescribed it for you.  If you do not have this equipment, it will be provided for you after surgery. If you have not been contacted by the surgery center/hospital by the day before your surgery, call to confirm the date and time of your surgery. Leave-time from work may vary depending on the type of surgery you have.  Appropriate arrangements should be made prior to surgery with your employer. Prescriptions will be provided immediately following surgery by your doctor.  Fill these as soon as possible after surgery and take the medication as directed. Pain medications will not be refilled on weekends and must be approved by the doctor. Remove nail polish on the operative foot and avoid getting pedicures prior to surgery. Wash the night before surgery.  The night before surgery wash the foot and leg well with water and the antibacterial soap provided. Be sure to pay special attention to beneath the toenails and in between the toes.  Wash for at least three (3) minutes. Rinse thoroughly with water and dry well with a towel.  Perform this wash unless told not to do so by your physician.  Enclosed: 1 Ice pack (please put in freezer the night before surgery)   1 Hibiclens skin cleaner   Pre-op instructions  If you have any questions regarding the  instructions, please do not hesitate to call our office.  [place]: [address], [place], [place] [address] -- [phone]  [place]: [address] [place], [place] [address] -- [phone]  [place]: [address]  [place], [place] [address] -- [phone]  [place]: [address], [address], [place], [place] [address] -- [phone]  Website: [email or web address]

## 2017-08-09 NOTE — Progress Notes (Signed)
   Subjective:    Patient ID: Pamela Malone, female    DOB: 1951/02/12, 66 y.o.   MRN: [id number]  HPI    Review of Systems  All other systems reviewed and are negative.      Objective:   Physical Exam        Assessment & Plan:

## 2017-08-09 NOTE — Progress Notes (Addendum)
Subjective:    Patient ID: Pamela Malone, female   DOB: 66 y.o.   MRN: [id number]   HPI patient presents my second toe has lifted up in the air spontaneously over the last 2 years and is becoming increasingly tender and my bunion is starting to bother me more and more patient states she's tried wider shoes she's tried padding the toe she soaked without relief of symptoms    Review of Systems  All other systems reviewed and are negative.       Objective:  Physical Exam  Constitutional: She appears well-developed and well-nourished.  Cardiovascular: Intact distal pulses.  Pulmonary/Chest: Effort normal.  Musculoskeletal: Normal range of motion.  Neurological: She is alert.  Skin: Skin is warm.  Nursing note and vitals reviewed.  neurovascular status intact muscle strength adequate range of motion within normal limits with patient noted to have enlarged second digit left with complete dorsal dislocation and structural bunion deformity left hallux and an abnormal position. Very tender when pressed with dorsal redness and history of blistering and inability to wear shoe gear with any degree of comfort. Patient's found have good digital perfusion and is well oriented 3     Assessment:   Probability for spontaneous flexor plate rupture of the second MPJ left with dorsal rigid excursion of the toe and structural bunion with also probable hallux interphalangeal deformity left      Plan:    H&P and x-ray reviewed with patient. Discussed treatment options and she states the pain is getting worse and she cannot wear shoes and she would like to get it corrected. I discussed digital fusion along with structural bunion correction and possible Akin osteotomy. Explained we will not get complete correction but I do think clinically is functioning well for her and will not have the complications associated with more proximal-type osteotomies. Patient wants surgery understanding APPLICATIONS and wants  to review today so she can get it done soon due to her schedule and I allowed her to read consent form for correction going over all possible complications as outlined in the consent form. Patient is willing to accept risk and after extensive review signs consent form and is given all preoperative instructions. Patient is scheduled for outpatient surgery understanding total recovery will take 6 months to one year and there is no long-term guarantees of correction and that the second toe will purchase the ground. Dispensed air fracture walker for the postoperative period and instructed on how to use  X-ray report indicates on the left foot there is elevation of the intermetatarsal angle of approximate 16 with rigid dorsal excursion of the second digit left and deviation the hallux under the second toe. Right foot does not show same deformity

## 2017-08-31 ENCOUNTER — Encounter: Payer: Self-pay | Admitting: Podiatry

## 2017-08-31 DIAGNOSIS — M2012 Hallux valgus (acquired), left foot: Secondary | ICD-10-CM | POA: Diagnosis not present

## 2017-08-31 DIAGNOSIS — M2042 Other hammer toe(s) (acquired), left foot: Secondary | ICD-10-CM | POA: Diagnosis not present

## 2017-08-31 DIAGNOSIS — M2010 Hallux valgus (acquired), unspecified foot: Secondary | ICD-10-CM | POA: Diagnosis not present

## 2017-09-03 ENCOUNTER — Telehealth: Payer: Self-pay | Admitting: Podiatry

## 2017-09-03 ENCOUNTER — Telehealth: Payer: Self-pay

## 2017-09-03 NOTE — Telephone Encounter (Signed)
Spoke with patient advising her to loosen ace bandage and take ibuprofen as directed for post op pain but she is to remain in her boot. She will call if symptoms are unchanged

## 2017-09-03 NOTE — Telephone Encounter (Signed)
I had surgery on Tuesday by Dr. Charlsie Merlesegal and I'm having problems with the boot. When I'm resting my foot I'm okay but when I get up to walk to the bathroom I can not use the boot and I wanted to know if there is something else I can use. Please call me back at [phone] so I can discuss this with you. Thank you.

## 2017-09-08 ENCOUNTER — Ambulatory Visit (INDEPENDENT_AMBULATORY_CARE_PROVIDER_SITE_OTHER): Payer: 59

## 2017-09-08 ENCOUNTER — Encounter: Payer: Self-pay | Admitting: Podiatry

## 2017-09-08 ENCOUNTER — Ambulatory Visit (INDEPENDENT_AMBULATORY_CARE_PROVIDER_SITE_OTHER): Payer: 59 | Admitting: Podiatry

## 2017-09-08 VITALS — BP 117/67 | HR 78 | Temp 98.2°F

## 2017-09-08 DIAGNOSIS — M21619 Bunion of unspecified foot: Secondary | ICD-10-CM

## 2017-09-08 DIAGNOSIS — M2040 Other hammer toe(s) (acquired), unspecified foot: Secondary | ICD-10-CM

## 2017-09-09 NOTE — Progress Notes (Signed)
Subjective:   Patient ID: Pamela Malone, female   DOB: 66 y.o.   MRN: [id number]   HPI Patient states doing well with mild left foot and states that if she does too much it can get sore but overall she is very happy with the results with her big toe straight and mild elevation of the second digit with wound edges well coapted.  Patient states that she has had minimal swelling.   ROS      Objective:  Physical Exam  Neurovascular status intact negative Homans sign was noted with patient's left foot healing well with wound edges well coapted good alignment with mild elevation of the second digit.     Assessment:  Doing well post osteotomy left and post digital fusion second digit left with pins in place.      Plan:  H&P x-rays reviewed and at this point sterile dressing reapplied lowering the second toe.  I explained continued compression elevation immobilization and patient will reappoint 2 weeks for suture removal and 4 weeks for pin removal and will be seen earlier if any issues should occur.  X-rays indicate the osteotomy is healing well with good alignment noted pin wire in place and second digit well positioned

## 2017-09-14 ENCOUNTER — Telehealth: Payer: Self-pay | Admitting: Podiatry

## 2017-09-14 NOTE — Telephone Encounter (Signed)
Pt left Val(RN) a voicemail to cancel appt on Dec. 26, this is pts 2nd post op. I left pt voicemail this morning to please give us a call to reschedule appt.

## 2017-09-22 ENCOUNTER — Encounter: Payer: Self-pay | Admitting: Podiatry

## 2017-09-22 ENCOUNTER — Encounter: Payer: 59 | Admitting: Podiatry

## 2017-09-22 ENCOUNTER — Ambulatory Visit (INDEPENDENT_AMBULATORY_CARE_PROVIDER_SITE_OTHER): Payer: 59 | Admitting: Podiatry

## 2017-09-22 DIAGNOSIS — M2040 Other hammer toe(s) (acquired), unspecified foot: Secondary | ICD-10-CM

## 2017-09-22 DIAGNOSIS — Z9889 Other specified postprocedural states: Secondary | ICD-10-CM

## 2017-09-22 DIAGNOSIS — M21619 Bunion of unspecified foot: Secondary | ICD-10-CM

## 2017-09-29 NOTE — Progress Notes (Signed)
   Subjective:  Patient presents today status post bunionectomy and hammertoe repair of the left foot. DOS: 08/31/17.  She states she is doing well overall.  She reports some pain to the second toe of the left foot.  She rates this pain at 5/10 currently.  Patient is here for further evaluation and treatment.   Past Medical History:  Diagnosis Date  . Cataract    immature  . Depression    many years ago  . DJD (degenerative joint disease)   . GERD (gastroesophageal reflux disease)   . History of right bundle branch block (RBBB)    released after cardiac evaluation by Dr Donnie Ahoilley 12/2014; gave surgical clearance  . Hyperlipidemia   . Hypertension   . Low back pain   . Menopause   . Nocturia   . Psoriasis   . Scoliosis       Objective/Physical Exam Skin incisions appear to be well coapted with sutures and staples intact. No sign of infectious process noted. No dehiscence. No active bleeding noted. Moderate edema noted to the surgical extremity.  Assessment: 1. s/p bunionectomy and hammertoe repair of the left foot. DOS: 08/31/17.   Plan of Care:  1. Patient was evaluated.  2.  Sutures removed today. 3.  Percutaneous fixation pin removed today. 4.  Compression anklet dispensed. 5.  Continue wearing postop shoe. 6.  Return to clinic in 2 weeks for follow-up x-ray.   Felecia ShellingBrent M. Dequante Tremaine, DPM [facility]  Dr. Felecia ShellingBrent M. Kemonte Ullman, DPM    [address]                                        [place], [place] [address]                Office [phone]  Fax [phone]

## 2017-10-06 ENCOUNTER — Ambulatory Visit (INDEPENDENT_AMBULATORY_CARE_PROVIDER_SITE_OTHER): Payer: 59 | Admitting: Podiatry

## 2017-10-06 ENCOUNTER — Ambulatory Visit (INDEPENDENT_AMBULATORY_CARE_PROVIDER_SITE_OTHER): Payer: 59

## 2017-10-06 ENCOUNTER — Encounter: Payer: Self-pay | Admitting: Podiatry

## 2017-10-06 DIAGNOSIS — M2042 Other hammer toe(s) (acquired), left foot: Secondary | ICD-10-CM

## 2017-10-06 NOTE — Progress Notes (Signed)
Subjective:   Patient ID: Pamela Malone, female   DOB: 67 y.o.   MRN: [id number]   HPI Patient states she is doing great with her left foot but does notice her second toe is up slightly in the air   ROS      Objective:  Physical Exam  Neurovascular status intact with good range of motion first MPJ with hallux in rectus position and second toe and a mildly elevated position     Assessment:  Doing well post osteotomy first metatarsal left Akin osteotomy digital fusion with mild elevation of the second toe     Plan:  At this time I dispensed a ankle brace with compression to lower the second toe and place the toe on stretch.  I reviewed continued range of motion continued immobilization and reappoint 4 weeks or earlier if necessary  X-rays indicate the osteotomy is healing well with some stress on the big toe but clinically looks excellent with the second toe in good alignment

## 2017-10-29 NOTE — Progress Notes (Signed)
DOS 08/31/17 [place] Bunionectomy, Possible aiken w/ wire fusion pin Lt 2nd toe

## 2017-11-03 ENCOUNTER — Ambulatory Visit (INDEPENDENT_AMBULATORY_CARE_PROVIDER_SITE_OTHER): Payer: 59 | Admitting: Podiatry

## 2017-11-03 ENCOUNTER — Ambulatory Visit (INDEPENDENT_AMBULATORY_CARE_PROVIDER_SITE_OTHER): Payer: 59

## 2017-11-03 ENCOUNTER — Encounter: Payer: Self-pay | Admitting: Podiatry

## 2017-11-03 DIAGNOSIS — M779 Enthesopathy, unspecified: Secondary | ICD-10-CM | POA: Diagnosis not present

## 2017-11-03 DIAGNOSIS — M2012 Hallux valgus (acquired), left foot: Secondary | ICD-10-CM

## 2017-11-03 MED ORDER — TRIAMCINOLONE ACETONIDE 10 MG/ML IJ SUSP
10.0000 mg | Freq: Once | INTRAMUSCULAR | Status: AC
  Administered 2017-11-03: 10 mg

## 2017-11-03 NOTE — Progress Notes (Signed)
Subjective:   Patient ID: Pamela Malone, female   DOB: 67 y.o.   MRN: [id number]   HPI Patient presents stating when she is on her foot a lot she still getting quite a bit of pain in her left second joint but overall is very happy with the bunion correction and the second digit with mild elevation of the second toe noted   ROS      Objective:  Physical Exam  Neurovascular status intact negative Homans sign was noted with good alignment of the first MPJ left with excellent range of motion no pain or crepitus in the left hallux causing no pain or crepitus with good alignment noted with inflammation pain of the second metatarsal phalangeal joint     Assessment:  Doing well with correction of structural deformity but does appear to have an inflammatory capsulitis second MPJ left     Plan:  H&P condition reviewed and explained and today I did do a proximal nerve block and under sterile technique I did aspirate the second MPJ getting out a small amount of fluid that did have a bloody infiltrate and I injected with 1/4 cc dexamethasone Kenalog and applied padding to take pressure off the joint and advised on rigid bottom shoes.  Reappoint in 2 weeks to reevaluate  X-rays indicate the alignment of the first MPJ is good with slight movement of the left hallux but that does not appear to have any pathology for her at the current time

## 2017-11-04 ENCOUNTER — Encounter: Payer: Self-pay | Admitting: Podiatry

## 2017-11-25 ENCOUNTER — Ambulatory Visit (INDEPENDENT_AMBULATORY_CARE_PROVIDER_SITE_OTHER): Payer: 59 | Admitting: Podiatry

## 2017-11-25 ENCOUNTER — Encounter: Payer: Self-pay | Admitting: Podiatry

## 2017-11-25 ENCOUNTER — Ambulatory Visit (INDEPENDENT_AMBULATORY_CARE_PROVIDER_SITE_OTHER): Payer: 59

## 2017-11-25 DIAGNOSIS — M2012 Hallux valgus (acquired), left foot: Secondary | ICD-10-CM

## 2017-11-25 DIAGNOSIS — S93402D Sprain of unspecified ligament of left ankle, subsequent encounter: Secondary | ICD-10-CM

## 2017-12-10 ENCOUNTER — Encounter: Payer: Self-pay | Admitting: Podiatry

## 2017-12-10 ENCOUNTER — Ambulatory Visit (INDEPENDENT_AMBULATORY_CARE_PROVIDER_SITE_OTHER): Payer: 59

## 2017-12-10 ENCOUNTER — Ambulatory Visit (INDEPENDENT_AMBULATORY_CARE_PROVIDER_SITE_OTHER): Payer: 59 | Admitting: Podiatry

## 2017-12-10 DIAGNOSIS — M2012 Hallux valgus (acquired), left foot: Secondary | ICD-10-CM

## 2017-12-10 DIAGNOSIS — M2042 Other hammer toe(s) (acquired), left foot: Secondary | ICD-10-CM

## 2017-12-10 DIAGNOSIS — M779 Enthesopathy, unspecified: Secondary | ICD-10-CM

## 2017-12-10 DIAGNOSIS — M7752 Other enthesopathy of left foot: Secondary | ICD-10-CM

## 2017-12-10 DIAGNOSIS — Z9889 Other specified postprocedural states: Secondary | ICD-10-CM

## 2017-12-10 MED ORDER — TRIAMCINOLONE ACETONIDE 10 MG/ML IJ SUSP
10.0000 mg | Freq: Once | INTRAMUSCULAR | Status: AC
  Administered 2017-12-10: 10 mg

## 2017-12-10 NOTE — Progress Notes (Signed)
Subjective:   Patient ID: Pamela Malone, female   DOB: 67 y.o.   MRN: [id number]   HPI Patient states that the injection helped a lot but the pain is recurred in the last couple weeks and it feels like she is walking unremarkable.  Overall patient is very pleased with her surgical result but does have pain if she is active   ROS      Objective:  Physical Exam  Neurovascular status with pain noted to have quite a bit of discomfort in the left second MPJ that is moderate in its intensity and also has pain in the posterior tibial tendon right that had been present previously.  [place] go to [place] and is concerned about walking     Assessment:  Inflammatory capsulitis second MPJ left with tendinitis of the posterior tibial tendon right with inflammation around the second MPJ left     Plan:  H&P x-rays reviewed and at this point I did careful sheath injection right 3 mg Dexasone Kenalog 5 mg Xylocaine and for the left I did inject periarticular around the joint surface with 2 mg Dexasone Kenalog 5 mg Xylocaine.  I then scanned for custom orthotics to reduce plantar pressure on the feet and patient will be seen back when those are ready and ultimately it is possible she will require a shortening osteotomy if symptoms were to persist  X-ray indicates that the osteotomy first metatarsal is healed very well along with the ache and slight elevation of the hallux that has not given her any issue and digital procedure second toe that did heal appropriately

## 2017-12-14 ENCOUNTER — Telehealth: Payer: Self-pay | Admitting: Podiatry

## 2017-12-14 NOTE — Telephone Encounter (Signed)
Called pt with orthotic coverage covered at 85% after 300.00 deductible(pt has met 200.00) up to 400.00. Pt would like to proceed with the orthotics and bill it to the insurance.

## 2018-01-04 ENCOUNTER — Ambulatory Visit (INDEPENDENT_AMBULATORY_CARE_PROVIDER_SITE_OTHER): Payer: 59 | Admitting: Orthotics

## 2018-01-04 DIAGNOSIS — M2012 Hallux valgus (acquired), left foot: Secondary | ICD-10-CM

## 2018-01-04 DIAGNOSIS — M779 Enthesopathy, unspecified: Secondary | ICD-10-CM

## 2018-01-04 DIAGNOSIS — S93402D Sprain of unspecified ligament of left ankle, subsequent encounter: Secondary | ICD-10-CM

## 2018-01-04 NOTE — Progress Notes (Signed)
Patient came in today to pick up custom made foot orthotics.  The goals were accomplished and the patient reported no dissatisfaction with said orthotics.  Patient was advised of breakin period and how to report any issues. 

## 2018-01-05 ENCOUNTER — Emergency Department (HOSPITAL_COMMUNITY): Payer: PRIVATE HEALTH INSURANCE

## 2018-01-05 ENCOUNTER — Encounter (HOSPITAL_COMMUNITY): Payer: Self-pay | Admitting: Family Medicine

## 2018-01-05 ENCOUNTER — Emergency Department (HOSPITAL_COMMUNITY)
Admission: EM | Admit: 2018-01-05 | Discharge: 2018-01-05 | Disposition: A | Discharge status: Home / Self Care | Payer: PRIVATE HEALTH INSURANCE | Attending: Emergency Medicine | Admitting: Emergency Medicine

## 2018-01-05 DIAGNOSIS — K529 Noninfective gastroenteritis and colitis, unspecified: Secondary | ICD-10-CM | POA: Diagnosis not present

## 2018-01-05 DIAGNOSIS — Z79899 Other long term (current) drug therapy: Secondary | ICD-10-CM | POA: Insufficient documentation

## 2018-01-05 DIAGNOSIS — R1031 Right lower quadrant pain: Secondary | ICD-10-CM | POA: Diagnosis present

## 2018-01-05 DIAGNOSIS — I10 Essential (primary) hypertension: Secondary | ICD-10-CM | POA: Diagnosis not present

## 2018-01-05 DIAGNOSIS — Z96652 Presence of left artificial knee joint: Secondary | ICD-10-CM | POA: Diagnosis not present

## 2018-01-05 LAB — CBC
HEMATOCRIT: 39.7 % (ref 36.0–46.0)
HEMOGLOBIN: 13.5 g/dL (ref 12.0–15.0)
MCH: 32.3 pg (ref 26.0–34.0)
MCHC: 34 g/dL (ref 30.0–36.0)
MCV: 95 fL (ref 78.0–100.0)
Platelets: 333 10*3/uL (ref 150–400)
RBC: 4.18 MIL/uL (ref 3.87–5.11)
RDW: 13.3 % (ref 11.5–15.5)
WBC: 26.9 10*3/uL — ABNORMAL HIGH (ref 4.0–10.5)

## 2018-01-05 LAB — COMPREHENSIVE METABOLIC PANEL
ALT: 20 U/L (ref 14–54)
ANION GAP: 10 (ref 5–15)
AST: 21 U/L (ref 15–41)
Albumin: 4.2 g/dL (ref 3.5–5.0)
Alkaline Phosphatase: 61 U/L (ref 38–126)
BILIRUBIN TOTAL: 0.8 mg/dL (ref 0.3–1.2)
BUN: 17 mg/dL (ref 6–20)
CO2: 26 mmol/L (ref 22–32)
Calcium: 9.6 mg/dL (ref 8.9–10.3)
Chloride: 97 mmol/L — ABNORMAL LOW (ref 101–111)
Creatinine, Ser: 0.87 mg/dL (ref 0.44–1.00)
GFR calc Af Amer: >60 mL/min (ref >60)
GFR calc non Af Amer: >60 mL/min (ref >60)
Glucose, Bld: 125 mg/dL — ABNORMAL HIGH (ref 65–99)
POTASSIUM: 4.4 mmol/L (ref 3.5–5.1)
Sodium: 133 mmol/L — ABNORMAL LOW (ref 135–145)
TOTAL PROTEIN: 7 g/dL (ref 6.5–8.1)

## 2018-01-05 LAB — URINALYSIS, ROUTINE W REFLEX MICROSCOPIC
BACTERIA UA: NONE SEEN
BILIRUBIN URINE: NEGATIVE
GLUCOSE, UA: NEGATIVE mg/dL
Ketones, ur: 5 mg/dL — AB
LEUKOCYTES UA: NEGATIVE
NITRITE: NEGATIVE
Protein, ur: NEGATIVE mg/dL
SPECIFIC GRAVITY, URINE: 1.021 (ref 1.005–1.030)
Squamous Epithelial / LPF: NONE SEEN
pH: 6 (ref 5.0–8.0)

## 2018-01-05 LAB — LIPASE, BLOOD: Lipase: 26 U/L (ref 11–51)

## 2018-01-05 MED ORDER — METRONIDAZOLE 500 MG PO TABS: 500.0000 mg | ORAL_TABLET | Freq: Three times a day (TID) | ORAL | 0 refills | Status: DC

## 2018-01-05 MED ORDER — SODIUM CHLORIDE 0.9 % IV BOLUS
1000.0000 mL | Freq: Once | INTRAVENOUS | Status: AC
  Administered 2018-01-05: 1000 mL via INTRAVENOUS

## 2018-01-05 MED ORDER — HYDROCODONE-ACETAMINOPHEN 5-325 MG PO TABS: 1.0000 | ORAL_TABLET | ORAL | 0 refills | Status: DC | PRN

## 2018-01-05 MED ORDER — MORPHINE SULFATE (PF) 4 MG/ML IV SOLN
4.0000 mg | Freq: Once | INTRAVENOUS | Status: AC
  Administered 2018-01-05: 4 mg via INTRAVENOUS
  Filled 2018-01-05: qty 1

## 2018-01-05 MED ORDER — IOPAMIDOL (ISOVUE-300) INJECTION 61%
100.0000 mL | Freq: Once | INTRAVENOUS | Status: AC | PRN
  Administered 2018-01-05: 100 mL via INTRAVENOUS

## 2018-01-05 MED ORDER — ONDANSETRON 4 MG PO TBDP: 4.0000 mg | ORAL_TABLET | Freq: Three times a day (TID) | ORAL | 0 refills | Status: DC | PRN

## 2018-01-05 MED ORDER — ONDANSETRON HCL 4 MG/2ML IJ SOLN
4.0000 mg | Freq: Once | INTRAMUSCULAR | Status: AC
  Administered 2018-01-05: 4 mg via INTRAVENOUS
  Filled 2018-01-05: qty 2

## 2018-01-05 MED ORDER — CIPROFLOXACIN HCL 750 MG PO TABS: 750.0000 mg | ORAL_TABLET | Freq: Two times a day (BID) | ORAL | 0 refills | Status: DC

## 2018-01-05 MED ORDER — CIPROFLOXACIN HCL 500 MG PO TABS
750.0000 mg | ORAL_TABLET | Freq: Once | ORAL | Status: AC
  Administered 2018-01-05: 750 mg via ORAL
  Filled 2018-01-05: qty 2

## 2018-01-05 MED ORDER — METRONIDAZOLE 500 MG PO TABS
500.0000 mg | ORAL_TABLET | Freq: Once | ORAL | Status: AC
  Administered 2018-01-05: 500 mg via ORAL
  Filled 2018-01-05: qty 1

## 2018-01-05 MED ORDER — IOPAMIDOL (ISOVUE-300) INJECTION 61%
INTRAVENOUS | Status: AC
  Filled 2018-01-05: qty 100

## 2018-01-05 NOTE — ED Triage Notes (Signed)
Patient is experiencing right lower quad pain with tenderness that started last night around 20:00-21:00. Patient reports the pain started at her naval area and moved over to the right side. Also, reports chills and sweating. Denies any nausea or vomiting.

## 2018-01-05 NOTE — Discharge Instructions (Addendum)
Take Cipro twice a day for a week Take flagyl three times a day for a week Take Norco for severe pain Take Zofran for nausea Follow up with your doctor - you will need a colonoscopy Return if worsening

## 2018-01-05 NOTE — ED Notes (Signed)
Pt is alert and orinted x 4 and is verbally responsive. Pt denies pain a this time. Pt is resting calmly i

## 2018-01-05 NOTE — ED Notes (Signed)
Patient transported to CT 

## 2018-01-05 NOTE — ED Provider Notes (Signed)
[facility]-EMERGENCY DEPT Provider Note   CSN: [id number] Arrival date & time: 01/05/18  1340     History   Chief Complaint Chief Complaint  Patient presents with  . Abdominal Pain    HPI Pamela Malone is a 67 y.o. female who presents with right lower quadrant abdominal pain.  Past medical history significant for arthritis, GERD, hypertension, hyperlipidemia, psoriasis.  She states that last night about 8 PM she started to develop periumbilical abdominal pain and bloating.  It is an aching constant pain. It is worse with movement. Today the pain was in the right lower quadrant however she still feels bloated.  She reports chills, sweats, nausea and loose stools.  She does not denies fever, chest pain, shortness of breath, flank pain, urinary symptoms. She denies prior abdominal surgeries. She has not had a colonoscopy.  HPI  Past Medical History:  Diagnosis Date  . Cataract    immature  . Depression    many years ago  . DJD (degenerative joint disease)   . GERD (gastroesophageal reflux disease)   . History of right bundle branch block (RBBB)    released after cardiac evaluation by Dr Donnie Aho 12/2014; gave surgical clearance  . Hyperlipidemia   . Hypertension   . Low back pain   . Menopause   . Nocturia   . Psoriasis   . Scoliosis     Patient Active Problem List   Diagnosis Date Noted  . 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 Arthritis of knee, degenerative 01/25/2015  . History of hyperglycemia 02/20/2013  . 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 Shingles 02/23/2012  . 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 DJD (degenerative joint disease)   . Scoliosis   . Low back pain   . Depression   . GERD (gastroesophageal reflux disease)   . Hyperlipidemia   . Hypertension   . Menopause   . Psoriasis     Past Surgical History:  Procedure Laterality Date  . ANKLE SURGERY Right    from a previous injury as a child   . CARPAL TUNNEL RELEASE  bilateral  .  DILATION AND CURETTAGE OF UTERUS  2009  . Left Thumb Joint Replacement    . TOTAL KNEE ARTHROPLASTY Left 01/28/2015   Procedure: TOTAL KNEE ARTHROPLASTY;  Surgeon: Dannielle Huh, MD;  Location: [facility] OR;  Service: Orthopedics;  Laterality: Left;     OB History    Gravida  3   Para  2   Term      Preterm      AB  1   Living  2     SAB      TAB      Ectopic      Multiple      Live Births               Home Medications    Prior to Admission medications   Medication Sig Start Date End Date Taking? Authorizing Provider  cetirizine (ZYRTEC) 10 MG tablet Take 10 mg by mouth daily.    Yes [provider]  cholecalciferol (VITAMIN D) 1000 units tablet Take 1,000 Units by mouth daily.   Yes [provider]  citalopram (CELEXA) 20 MG tablet Take 20 mg by mouth daily.  02/12/16  Yes [provider]  clobetasol ointment (TEMOVATE) 0.05 % APPLY 1 APPLICATION TO AFFECTED AREA TWICE A DAY WHEN FLARING EXTERNALLY TO HANDS 14 DAYS 02/13/16  Yes [provider]  folic acid (  FOLVITE) 1 MG tablet Take 1 mg by mouth daily. 11/29/17  Yes [provider]  meloxicam (MOBIC) 15 MG tablet Take 15 mg by mouth daily.  05/16/11  Yes [provider]  methotrexate (RHEUMATREX) 2.5 MG tablet TAKE 3 TABLETS BY MOUTH ONCE A WEEK ON THURSDAY 11/30/17  Yes [provider]  Multiple Vitamin (MULTIVITAMIN WITH MINERALS) TABS tablet Take 1 tablet by mouth daily.   Yes [provider]  Multiple Vitamins-Minerals (ICAPS AREDS 2 PO) Take 1 tablet by mouth daily.   Yes [provider]  Olmesartan-Amlodipine-HCTZ (TRIBENZOR) 40-5-25 MG TABS Take 1 tablet by mouth daily. 05/24/14  Yes Schoenhoff, Harrington Challenger, MD  omeprazole (PRILOSEC) 20 MG capsule TAKE 1 CAPSULE DAILY 02/12/14  Yes Schoenhoff, Harrington Challenger, MD  PROTOPIC 0.1 % ointment Apply 1 application topically 2 (two) times daily as needed (ON FACE).  11/05/11  Yes [provider]    simvastatin (ZOCOR) 20 MG tablet TAKE 1 TABLET AT BEDTIME 05/24/14  Yes Schoenhoff, Harrington Challenger, MD  enoxaparin (LOVENOX) 40 MG/0.4ML injection Inject 0.4 mLs (40 mg total) into the skin daily. Patient not taking: Reported on 01/05/2018 01/29/15   Altamese Cabal, PA-C  colesevelam [facility]) 625 MG tablet Take 3,750 mg by mouth daily.    12/14/11  [provider]    Family History Family History  Problem Relation Age of Onset  . Hypertension Mother   . Stroke Mother   . Alzheimer's disease Mother   . Diabetes Paternal Grandmother   . Parkinsonism Paternal Grandmother   . Breast cancer Neg Hx     Social History Social History   Tobacco Use  . Smoking status: Never Smoker  . Smokeless tobacco: Never Used  Substance Use Topics  . Alcohol use: Yes    Comment: Couple of glasses of wine at night  . Drug use: No     Allergies   Patient has no known allergies.   Review of Systems Review of Systems  Constitutional: Positive for chills and diaphoresis. Negative for fever.  Respiratory: Negative for shortness of breath.   Cardiovascular: Negative for chest pain.  Gastrointestinal: Positive for abdominal pain, diarrhea and nausea. Negative for blood in stool and vomiting.  Genitourinary: Negative for dysuria and frequency.  All other systems reviewed and are negative.    Physical Exam Updated Vital Signs BP 131/75   Pulse 85   Temp 98.9 F (37.2 C) (Oral)   Resp 16   Ht 5\' 1"  (1.549 m)   Wt 81.6 kg (180 lb)   SpO2 96%   BMI 34.01 kg/m   Physical Exam  Constitutional: She is oriented to person, place, and time. She appears well-developed and well-nourished. No distress.  HENT:  Head: Normocephalic and atraumatic.  Eyes: Pupils are equal, round, and reactive to light. Conjunctivae are normal. Right eye exhibits no discharge. Left eye exhibits no discharge. No scleral icterus.  Neck: Normal range of motion.  Cardiovascular: Normal rate and regular rhythm.   Pulmonary/Chest: Effort normal and breath sounds normal. No respiratory distress.  Abdominal: Soft. Bowel sounds are normal. She exhibits no distension. There is tenderness (RLQ tenderness). There is rebound.  Neurological: She is alert and oriented to person, place, and time.  Skin: Skin is warm and dry.  Psychiatric: She has a normal mood and affect. Her behavior is normal.  Nursing note and vitals reviewed.    ED Treatments / Results  Labs (all labs ordered are listed, but only abnormal results are displayed)  Labs Reviewed  COMPREHENSIVE METABOLIC PANEL - Abnormal; Notable for the following components:      Result Value   Sodium 133 (*)    Chloride 97 (*)    Glucose, Bld 125 (*)    All other components within normal limits  CBC - Abnormal; Notable for the following components:   WBC 26.9 (*)    All other components within normal limits  URINALYSIS, ROUTINE W REFLEX MICROSCOPIC - Abnormal; Notable for the following components:   Hgb urine dipstick SMALL (*)    Ketones, ur 5 (*)    All other components within normal limits  LIPASE, BLOOD    EKG None  Radiology Ct Abdomen Pelvis W Contrast  Result Date: 01/05/2018 CLINICAL DATA:  67 year old female with right lower quadrant abdominal pain since 2000 hours yesterday. Appendicitis suspected. EXAM: CT ABDOMEN AND PELVIS WITH CONTRAST TECHNIQUE: Multidetector CT imaging of the abdomen and pelvis was performed using the standard protocol following bolus administration of intravenous contrast. CONTRAST:  ISOVUE-300 IOPAMIDOL (ISOVUE-300) INJECTION 61% COMPARISON:  Upper GI series 02/05/2009. FINDINGS: Lower chest: Negative lung bases. No pleural effusion. Mild cardiomegaly. No pericardial effusion. Hepatobiliary: Cholelithiasis. No pericholecystic inflammation. Small probable benign cyst in the inferior right hepatic lobe on series 2, image 32. Otherwise negative liver. Pancreas: Negative. Spleen: Negative. Adrenals/Urinary  Tract: Normal adrenal glands. Bilateral renal enhancement and contrast excretion is symmetric and within normal limits. There is a small anterior left renal midpole benign cysts suspected. Negative course of the right ureter. Negative left ureter. Several pelvic phleboliths, more so on the left. Diminutive and unremarkable urinary bladder. Stomach/Bowel: Negative rectum. Redundant sigmoid colon which tracks into the epigastrium. Diverticulosis of the proximal sigmoid and distal descending colon without active inflammation. Redundant splenic flexure. Negative transverse colon. Negative ascending colon. There is inflammation about the cecum, but the appendix appears unaffected and normal (series 2, image 57 and coronal images 36 through 49. The cecal wall is indistinct and there is mild pericecal inflammation mostly posteriorly and laterally near the level of the ileocecal valve. However, the terminal ileum does not appear inflamed. There are no large bowel diverticula in the region. The other distal small bowel loops are also within normal limits. No dilated small bowel. Small gastric hiatal hernia. Otherwise negative stomach and duodenum. No abdominal free air.  No free fluid. Vascular/Lymphatic: Mild Calcified aortic atherosclerosis. The major arterial structures in the abdomen and pelvis are patent. The portal venous system is patent. No lymphadenopathy. No abnormal mesenteric lymph nodes in the right lower quadrant identified. Reproductive: Negative. Other: No pelvic free fluid. Musculoskeletal: Moderate levoconvex scoliosis. Intermittent advanced lumbar disc, endplate, and posterior element degeneration. No acute osseous abnormality identified. IMPRESSION: 1. Inflamed cecum and edematous pericecal fat, but the appendix remains normal. Consider acute infectious cecal colitis or typhlitis, however, follow-up is recommended to exclude a cecal neoplasm which can have a similar appearance and presentation. 2. The  distal small bowel remains normal. There is no free fluid, lymphadenopathy, or complicating features. 3. Cholelithiasis. Electronically Signed   By: Odessa Fleming M.D.   On: 01/05/2018 15:58    Procedures Procedures (including critical care time)  Medications Ordered in ED Medications  iopamidol (ISOVUE-300) 61 % injection (has no administration in time range)  morphine 4 MG/ML injection 4 mg (4 mg Intravenous Given 01/05/18 1435)  sodium chloride 0.9 % bolus 1,000 mL (0 mLs Intravenous Stopped 01/05/18 1753)  ondansetron (ZOFRAN) injection 4 mg (4 mg Intravenous Given 01/05/18 1434)  iopamidol (  ISOVUE-300) 61 % injection 100 mL (100 mLs Intravenous Contrast Given 01/05/18 1531)  ciprofloxacin (CIPRO) tablet 750 mg (750 mg Oral Given 01/05/18 1723)  metroNIDAZOLE (FLAGYL) tablet 500 mg (500 mg Oral Given 01/05/18 1723)     Initial Impression / Assessment and Plan / ED Course  I have reviewed the triage vital signs and the nursing notes.  Pertinent labs & imaging results that were available during my care of the patient were reviewed by me and considered in my medical decision making (see chart for details).  67 year old female presents with right lower quadrant pain.  Her vital signs are normal.  Her abdomen is tender in the right lower quadrant.  Suspect appendicitis.  Fluids, pain medicine, antiemetics, CT was ordered.  CT is remarkable for inflamed cecum.  She was given a dose of antibiotics in the ED and tolerated p.o.  She reports her pain is improved with medicines.  She was given strict return precautions and advised that she will need a colonoscopy in the future once her symptoms resolve.  She verbalized understanding.  Final Clinical Impressions(s) / ED Diagnoses   Final diagnoses:  Acute cecitis    ED Discharge Orders    None       Bethel BornGekas, Efstathios Sawin Marie, PA-C 01/05/18 1800    Linwood DibblesKnapp, Jon, MD 01/05/18 [phone]

## 2018-01-24 ENCOUNTER — Other Ambulatory Visit: Payer: Self-pay | Admitting: Family Medicine

## 2018-01-24 DIAGNOSIS — Z1231 Encounter for screening mammogram for malignant neoplasm of breast: Secondary | ICD-10-CM

## 2018-02-15 ENCOUNTER — Ambulatory Visit
Admission: RE | Admit: 2018-02-15 | Discharge: 2018-02-15 | Disposition: A | Discharge status: Home / Self Care | Payer: PRIVATE HEALTH INSURANCE | Source: Ambulatory Visit | Attending: Family Medicine | Admitting: Family Medicine

## 2018-02-15 ENCOUNTER — Ambulatory Visit: Payer: Self-pay | Admitting: Surgery

## 2018-02-15 DIAGNOSIS — Z1231 Encounter for screening mammogram for malignant neoplasm of breast: Secondary | ICD-10-CM

## 2018-02-15 NOTE — H&P (Signed)
CC: Referral by Dr. Marca Ancona for evaluation of right colon ulcerated mass-like lesion  HPI: Pamela Malone is a very pleasant 67yoF referred to me by Dr. Marca Ancona for evaluation of a ulcerated mass like lesion of the ascending colon which was tattooed distally. She developed acute onset right-sided abdominal discomfort 12/2017 and underwent evaluation. She reports not having had fevers but did have a Jacaria Colburn blood cell count of 26. CT abdomen and pelvis demonstrated an inflamed cecum and edematous pericecal fat but normal appearing appendix. Considered acute infectious cecal colitis or typhlitis but possibly a neoplasm. The distal small bowel appeared normal. There is no free fluid, lymphadenopathy or other complicated features. Incidentally noted gallstones  Colonoscopy by Dr. Marca Ancona 01/27/18 demonstrated perianal skin tags, hemorrhoids, localized area of severely inflamed, ulcerated and thickened folds of mucosa in the ascending colon opposite of the ileocecal valve-question malignant ulcerated mass. The area appeared erythematous, ulcerated, friable. Biopsies were taken in the area around this area was injected with tattoo. Pathology returned benign ulcerated colonic mucosa, negative for dysplasia and malignancy.  She saw Dr. Marca Ancona in follow-up he subsequently referred her here for consideration of surgery to rule out this being a malignant process; differential including lymphoma, infectious colitis, possibly Crohn's, focal ischemic colitis although atypical location, colon adenocarcinoma  Today, she denies any complaints. She is moving her bowels, denies blood in stool and denies abdominal pain. Tolerating diet without n/v or bloating.  PMH: Psoriatic arthritis for which she takes a low-dose methotrexate for - 7.5 mg by mouth per week; hyperlipidemia (well-controlled on simvastatin), GERD (is well controlled on PPI)  PSH: Left knee and left foot surgery; left thumb surgery. Denies any prior abdominal  surgeries  FHx: Denies FHx of malignancy  Social: Denies use of tobacco/drugs; drinks 1-2 glasses of wine most nights  ROS: A comprehensive 10 system review of systems was completed with the patient and pertinent findings as noted above.  The patient is a 67 year old female.   Past Surgical History (Tanisha A. Manson Passey, RMA; 02/09/2018 3:13 PM) Colon Polyp Removal - Colonoscopy  Foot Surgery  Left. Knee Surgery  Left. Oral Surgery   Diagnostic Studies History (Tanisha A. Manson Passey, RMA; 02/09/2018 3:13 PM) Colonoscopy  within last year Mammogram  1-3 years ago Pap Smear  >5 years ago  Allergies (Tanisha A. Manson Passey, RMA; 02/09/2018 3:14 PM) No Known Drug Allergies [02/09/2018]: Allergies Reconciled   Medication History (Tanisha A. Manson Passey, RMA; 02/09/2018 3:18 PM) Simvastatin (  Tablet, Oral) Active. Omeprazole (  Capsule DR, Oral) Active. Tribenzor (40-5-12.5MG  Tablet, Oral) Active. CeleXA (  Tablet, Oral) Active. Mobic (  Tablet, Oral) Active. Methotrexate (2.5MG  Tablet, Oral) Active. Protopic (0.1% Ointment, External) Active. Clobetasol Propionate (0.05% Gel, External) Active. Folic Acid (Oral) Specific strength unknown - Active. ZyrTEC Allergy (  Tablet, Oral) Active. Centrum Silver (Oral) Active. ICaps Areds 2 (Oral) Active. Vitamin D (2000UNIT Tablet, Oral) Active. Biotin ( Tablet, Oral) Active. Aspirin (  Tablet, Oral) Active. Norco (5-325MG  Tablet, Oral) Active. Medications Reconciled  Social History (Tanisha A. Manson Passey, RMA; 02/09/2018 3:13 PM) Alcohol use  Moderate alcohol use. Caffeine use  Carbonated beverages, Coffee. No drug use  Tobacco use  Never smoker.  Family History (Tanisha A. Manson Passey, RMA; 02/09/2018 3:13 PM) Alcohol Abuse  Brother, Father. Cerebrovascular Accident  Brother, Mother. Hypertension  Daughter, Mother. Seizure disorder  Father.  Pregnancy / Birth History (Tanisha A. Manson Passey, RMA; 02/09/2018  3:13 PM) Age at menarche  14 years. Age of menopause  28-60 Contraceptive History  Oral contraceptives. Rhett Bannister  3 Maternal age  21-25 Para  2  Other Problems (Tanisha A. Manson Passey, RMA; 02/09/2018 3:13 PM) Arthritis  Atrial Fibrillation  Back Pain  Depression  General anesthesia - complications  Hemorrhoids  High blood pressure  Hypercholesterolemia     Review of Systems (Tanisha A. Brown RMA; 02/09/2018 3:13 PM) General Not Present- Appetite Loss, Chills, Fatigue, Fever, Night Sweats, Weight Gain and Weight Loss. Skin Not Present- Change in Wart/Mole, Dryness, Hives, Jaundice, New Lesions, Non-Healing Wounds, Rash and Ulcer. HEENT Present- Seasonal Allergies and Wears glasses/contact lenses. Not Present- Earache, Hearing Loss, Hoarseness, Nose Bleed, Oral Ulcers, Ringing in the Ears, Sinus Pain, Sore Throat, Visual Disturbances and Yellow Eyes. Respiratory Not Present- Bloody sputum, Chronic Cough, Difficulty Breathing, Snoring and Wheezing. Breast Not Present- Breast Mass, Breast Pain, Nipple Discharge and Skin Changes. Cardiovascular Not Present- Chest Pain, Difficulty Breathing Lying Down, Leg Cramps, Palpitations, Rapid Heart Rate, Shortness of Breath and Swelling of Extremities. Gastrointestinal Present- Hemorrhoids. Not Present- Abdominal Pain, Bloating, Bloody Stool, Change in Bowel Habits, Chronic diarrhea, Constipation, Difficulty Swallowing, Excessive gas, Gets full quickly at meals, Indigestion, Nausea, Rectal Pain and Vomiting. Female Genitourinary Not Present- Frequency, Nocturia, Painful Urination, Pelvic Pain and Urgency. Musculoskeletal Present- Back Pain and Joint Pain. Not Present- Joint Stiffness, Muscle Pain, Muscle Weakness and Swelling of Extremities. Neurological Not Present- Decreased Memory, Fainting, Headaches, Numbness, Seizures, Tingling, Tremor, Trouble walking and Weakness. Psychiatric Present- Depression. Not Present- Anxiety, Bipolar, Change in  Sleep Pattern, Fearful and Frequent crying. Endocrine Not Present- Cold Intolerance, Excessive Hunger, Hair Changes, Heat Intolerance, Hot flashes and New Diabetes. Hematology Present- Easy Bruising. Not Present- Blood Thinners, Excessive bleeding, Gland problems, HIV and Persistent Infections.  Vitals (Tanisha A. Brown RMA; 02/09/2018 3:14 PM) 02/09/2018 3:14 PM Weight: 181.6 lb Height: 61in Body Surface Area: 1.81 m Body Mass Index: 34.31 kg/m  Temp.: 98.70F  Pulse: 75 (Regular)  BP: 110/72 (Sitting, Left Arm, Standard)       Physical Exam Cristal Deer M. Desarea Ohagan MD; 02/10/2018 1:19 PM) The physical exam findings are as follows: Note:Constitutional: No acute distress; conversant; no deformities Eyes: Moist conjunctiva; no lid lag; anicteric sclerae; pupils equal round and reactive to light Neck: Trachea midline; no palpable thyromegaly Lungs: Normal respiratory effort; no tactile fremitus CV: Regular rate and rhythm; no palpable thrill; no pitting edema GI: Abdomen soft, nontender, nondistended; no palpable hepatosplenomegaly MSK: Normal gait; no clubbing/cyanosis Psychiatric: Appropriate affect; alert and oriented 3 Lymphatic: No palpable cervical or axillary lymphadenopathy    Assessment & Plan Cristal Deer M. Jahaad Penado MD; 02/10/2018 1:28 PM) MASS OF COLON (K63.9) Impression: Ms. Bingman is a very pleasant 67yoF hx of psoriatic arthritis, HLD, GERD - here today for evaluation of an ulcerated mass of the ascending colon - biopsies thus far have been benign -I had a long discussion with the patient and her husband today regarding all of the above. We discussed the fact that at this time, biopsies have not revealed this to be a malignancy. Differential diagnosis remains quite wide at this time including lymphoma, infectious colitis, Crohn's, focal ischemic colitis although atypical location, colon adenocarcinoma, other malignancy. -We discussed her options moving forward  including surveillance with its inherent risks including if this were to be underlying malignancy, progression. We also discussed operative intervention which will give [place] definitive information as to what this is and if it were cancer, would also serve to potentially address it. -She opted to proceed with surgery. We will plan laparoscopic vs open right hemicolectomy. -The anatomy and physiology  of the GI tract was discussed at length with the patient. The pathophysiology of all of the above including malignancy and polyps was discussed at length with associated pictures. -The planned procedure, material risks (including, but not limited to, pain, bleeding, infection, scarring, need for blood transfusion, damage to surrounding structures- blood vessels/nerves/viscus/organs, damage to ureter, urine leak, leak from anastomosis, need for additional procedures, need for stoma which may be permanent, hernia, worsening of pre-existing medical conditions, recurrence - particularly if this was cancer, pneumonia, heart attack, stroke, death) benefits and alternatives to surgery were discussed at length. The patient's questions were answered to her satisfaction, she voiced understanding and elected to proceed with surgery. Additionally, we discussed typical postoperative expectations and the recovery process.  Signed electronically by Andria Meuse, MD (02/10/2018 1:29 PM)

## 2018-03-23 NOTE — Patient Instructions (Addendum)
Pamela Malone  03/23/2018   Your procedure is scheduled on: 03-30-18   Report to [facility] Main  Entrance    Report to Admitting at 6:30 AM    Call this number if you have problems the morning of surgery [phone]    Remember: DRINK 2 PRESURGERY ENSURE DRINKS THE NIGHT BEFORE SURGERY AT  1000 PM AND 1 PRESURGERY DRINK THE DAY OF THE PROCEDURE 3 HOURS PRIOR TO SCHEDULED SURGERY. NO SOLIDS AFTER MIDNIGHT THE DAY PRIOR TO THE SURGERY. NOTHING BY MOUTH EXCEPT CLEAR LIQUIDS UNTIL THREE HOURS PRIOR TO SCHEDULED SURGERY. PLEASE FINISH PRESURGERY ENSURE DRINK PER SURGEON ORDER 3 HOURS PRIOR TO SCHEDULED SURGERY TIME WHICH NEEDS TO BE COMPLETED AT 5:30.     CLEAR LIQUID DIET   Foods Allowed                                                                     Foods Excluded  Coffee and tea, regular and decaf                             liquids that you cannot  Plain Jell-O in any flavor                                             see through such as: Fruit ices (not with fruit pulp)                                     milk, soups, orange juice  Iced Popsicles                                    All solid food Carbonated beverages, regular and diet                                    Cranberry, grape and apple juices Sports drinks like Gatorade Lightly seasoned clear broth or consume(fat free) Sugar, honey syrup  Sample Menu Breakfast                                Lunch                                     Supper Cranberry juice                    Beef broth                            Chicken broth Jell-O  Grape juice                           Apple juice Coffee or tea                        Jell-O                                      Popsicle                                                Coffee or tea                        Coffee or tea  _____________________________________________________________________     Take these  medicines the morning of surgery with A SIP OF WATER: Citalopram (Celexa), and Omeprazole (Prilosec). You may bring and use your nasal spray, and eyedrops as needed.                                You may not have any metal on your body including hair pins and              piercings  Do not wear jewelry, make-up, lotions, powders or perfumes, deodorant             Do not wear nail polish.  Do not shave  48 hours prior to surgery.                Do not bring valuables to the hospital. [facility] IS NOT             RESPONSIBLE   FOR VALUABLES.  Contacts, dentures or bridgework may not be worn into surgery.  Leave suitcase in the car. After surgery it may be brought to your room.     Special Instructions: Please follow your prep, per your surgeon's instructions              Please read over the following fact sheets you were given: _____________________________________________________________________          [facility] - Preparing for Surgery Before surgery, you can play an important role.  Because skin is not sterile, your skin needs to be as free of germs as possible.  You can reduce the number of germs on your skin by washing with CHG (chlorahexidine gluconate) soap before surgery.  CHG is an antiseptic cleaner which kills germs and bonds with the skin to continue killing germs even after washing. Please DO NOT use if you have an allergy to CHG or antibacterial soaps.  If your skin becomes reddened/irritated stop using the CHG and inform your nurse when you arrive at Short Stay. Do not shave (including legs and underarms) for at least 48 hours prior to the first CHG shower.  You may shave your face/neck. Please follow these instructions carefully:  1.  Shower with CHG Soap the night before surgery and the  morning of Surgery.  2.  If you choose to wash your hair, wash your hair first as usual with your  normal  shampoo.  3.  After you shampoo, rinse  your hair and body thoroughly to  remove the  shampoo.                           4.  Use CHG as you would any other liquid soap.  You can apply chg directly  to the skin and wash                       Gently with a scrungie or clean washcloth.  5.  Apply the CHG Soap to your body ONLY FROM THE NECK DOWN.   Do not use on face/ open                           Wound or open sores. Avoid contact with eyes, ears mouth and genitals (private parts).                       Wash face,  Genitals (private parts) with your normal soap.             6.  Wash thoroughly, paying special attention to the area where your surgery  will be performed.  7.  Thoroughly rinse your body with warm water from the neck down.  8.  DO NOT shower/wash with your normal soap after using and rinsing off  the CHG Soap.                9.  Pat yourself dry with a clean towel.            10.  Wear clean pajamas.            11.  Place clean sheets on your bed the night of your first shower and do not  sleep with pets. Day of Surgery : Do not apply any lotions/deodorants the morning of surgery.  Please wear clean clothes to the hospital/surgery center.  FAILURE TO FOLLOW THESE INSTRUCTIONS MAY RESULT IN THE CANCELLATION OF YOUR SURGERY PATIENT SIGNATURE_________________________________  NURSE SIGNATURE__________________________________  ________________________________________________________________________   Pamela Malone  An incentive spirometer is a tool that can help keep your lungs clear and active. This tool measures how well you are filling your lungs with each breath. Taking long deep breaths may help reverse or decrease the chance of developing breathing (pulmonary) problems (especially infection) following:  A long period of time when you are unable to move or be active. BEFORE THE PROCEDURE   If the spirometer includes an indicator to show your best effort, your nurse or respiratory therapist will set it to a desired goal.  If possible, sit  up straight or lean slightly forward. Try not to slouch.  Hold the incentive spirometer in an upright position. INSTRUCTIONS FOR USE  1. Sit on the edge of your bed if possible, or sit up as far as you can in bed or on a chair. 2. Hold the incentive spirometer in an upright position. 3. Breathe out normally. 4. Place the mouthpiece in your mouth and seal your lips tightly around it. 5. Breathe in slowly and as deeply as possible, raising the piston or the ball toward the top of the column. 6. Hold your breath for 3-5 seconds or for as long as possible. Allow the piston or ball to fall to the bottom of the column. 7. Remove the mouthpiece from your mouth and breathe out normally. 8. Rest for a few seconds and  repeat Steps 1 through 7 at least 10 times every 1-2 hours when you are awake. Take your time and take a few normal breaths between deep breaths. 9. The spirometer may include an indicator to show your best effort. Use the indicator as a goal to work toward during each repetition. 10. After each set of 10 deep breaths, practice coughing to be sure your lungs are clear. If you have an incision (the cut made at the time of surgery), support your incision when coughing by placing a pillow or rolled up towels firmly against it. Once you are able to get out of bed, walk around indoors and cough well. You may stop using the incentive spirometer when instructed by your caregiver.  RISKS AND COMPLICATIONS  Take your time so you do not get dizzy or light-headed.  If you are in pain, you may need to take or ask for pain medication before doing incentive spirometry. It is harder to take a deep breath if you are having pain. AFTER USE  Rest and breathe slowly and easily.  It can be helpful to keep track of a log of your progress. Your caregiver can provide you with a simple table to help with this. If you are using the spirometer at home, follow these instructions: SEEK MEDICAL CARE IF:   You are  having difficultly using the spirometer.  You have trouble using the spirometer as often as instructed.  Your pain medication is not giving enough relief while using the spirometer.  You develop fever of 100.5 F (38.1 C) or higher. SEEK IMMEDIATE MEDICAL CARE IF:   You cough up bloody sputum that had not been present before.  You develop fever of 102 F (38.9 C) or greater.  You develop worsening pain at or near the incision site. MAKE SURE YOU:   Understand these instructions.  Will watch your condition.  Will get help right away if you are not doing well or get worse. Document Released: 01/25/2007 Document Revised: 12/07/2011 Document Reviewed: 03/28/2007 [employer] Patient Information 2014 [employer], [place].   ________________________________________________________________________  WHAT IS A BLOOD TRANSFUSION? Blood Transfusion Information  A transfusion is the replacement of blood or some of its parts. Blood is made up of multiple cells which provide different functions.  Red blood cells carry oxygen and are used for blood loss replacement.  White blood cells fight against infection.  Platelets control bleeding.  Plasma helps clot blood.  Other blood products are available for specialized needs, such as hemophilia or other clotting disorders. BEFORE THE TRANSFUSION  Who gives blood for transfusions?   Healthy volunteers who are fully evaluated to make sure their blood is safe. This is blood bank blood. Transfusion therapy is the safest it has ever been in the practice of medicine. Before blood is taken from a donor, a complete history is taken to make sure that person has no history of diseases nor engages in risky social behavior (examples are intravenous drug use or sexual activity with multiple partners). The donor's travel history is screened to minimize risk of transmitting infections, such as malaria. The donated blood is tested for signs of infectious diseases,  such as HIV and hepatitis. The blood is then tested to be sure it is compatible with you in order to minimize the chance of a transfusion reaction. If you or a relative donates blood, this is often done in anticipation of surgery and is not appropriate for emergency situations. It takes many days to process the donated  blood. RISKS AND COMPLICATIONS Although transfusion therapy is very safe and saves many lives, the main dangers of transfusion include:   Getting an infectious disease.  Developing a transfusion reaction. This is an allergic reaction to something in the blood you were given. Every precaution is taken to prevent this. The decision to have a blood transfusion has been considered carefully by your caregiver before blood is given. Blood is not given unless the benefits outweigh the risks. AFTER THE TRANSFUSION  Right after receiving a blood transfusion, you will usually feel much better and more energetic. This is especially true if your red blood cells have gotten low (anemic). The transfusion raises the level of the red blood cells which carry oxygen, and this usually causes an energy increase.  The nurse administering the transfusion will monitor you carefully for complications. HOME CARE INSTRUCTIONS  No special instructions are needed after a transfusion. You may find your energy is better. Speak with your caregiver about any limitations on activity for underlying diseases you may have. SEEK MEDICAL CARE IF:   Your condition is not improving after your transfusion.  You develop redness or irritation at the intravenous (IV) site. SEEK IMMEDIATE MEDICAL CARE IF:  Any of the following symptoms occur over the next 12 hours:  Shaking chills.  You have a temperature by mouth above 102 F (38.9 C), not controlled by medicine.  Chest, back, or muscle pain.  People around you feel you are not acting correctly or are confused.  Shortness of breath or difficulty  breathing.  Dizziness and fainting.  You get a rash or develop hives.  You have a decrease in urine output.  Your urine turns a dark color or changes to pink, red, or brown. Any of the following symptoms occur over the next 10 days:  You have a temperature by mouth above 102 F (38.9 C), not controlled by medicine.  Shortness of breath.  Weakness after normal activity.  The white part of the eye turns yellow (jaundice).  You have a decrease in the amount of urine or are urinating less often.  Your urine turns a dark color or changes to pink, red, or brown. Document Released: 09/11/2000 Document Revised: 12/07/2011 Document Reviewed: 04/30/2008 [facility] Patient Information 2014 [place], [place].  _______________________________________________________________________

## 2018-03-24 ENCOUNTER — Encounter (HOSPITAL_COMMUNITY)
Admission: RE | Admit: 2018-03-24 | Discharge: 2018-03-24 | Disposition: A | Discharge status: Home / Self Care | Payer: 59 | Source: Ambulatory Visit | Attending: Surgery | Admitting: Surgery

## 2018-03-24 ENCOUNTER — Encounter (HOSPITAL_COMMUNITY): Payer: Self-pay

## 2018-03-24 ENCOUNTER — Other Ambulatory Visit: Payer: Self-pay

## 2018-03-24 DIAGNOSIS — Z01810 Encounter for preprocedural cardiovascular examination: Secondary | ICD-10-CM | POA: Diagnosis present

## 2018-03-24 DIAGNOSIS — Z01812 Encounter for preprocedural laboratory examination: Secondary | ICD-10-CM | POA: Insufficient documentation

## 2018-03-24 DIAGNOSIS — I10 Essential (primary) hypertension: Secondary | ICD-10-CM | POA: Diagnosis present

## 2018-03-24 LAB — CBC WITH DIFFERENTIAL/PLATELET
BASOS PCT: 0 %
Basophils Absolute: 0 10*3/uL (ref 0.0–0.1)
EOS ABS: 0.1 10*3/uL (ref 0.0–0.7)
EOS PCT: 2 %
HCT: 40.3 % (ref 36.0–46.0)
Hemoglobin: 13.6 g/dL (ref 12.0–15.0)
LYMPHS ABS: 2 10*3/uL (ref 0.7–4.0)
Lymphocytes Relative: 34 %
MCH: 32.5 pg (ref 26.0–34.0)
MCHC: 33.7 g/dL (ref 30.0–36.0)
MCV: 96.2 fL (ref 78.0–100.0)
MONO ABS: 0.7 10*3/uL (ref 0.1–1.0)
MONOS PCT: 12 %
Neutro Abs: 3.1 10*3/uL (ref 1.7–7.7)
Neutrophils Relative %: 52 %
PLATELETS: 315 10*3/uL (ref 150–400)
RBC: 4.19 MIL/uL (ref 3.87–5.11)
RDW: 13.4 % (ref 11.5–15.5)
WBC: 5.8 10*3/uL (ref 4.0–10.5)

## 2018-03-24 LAB — COMPREHENSIVE METABOLIC PANEL
ALK PHOS: 53 U/L (ref 38–126)
ALT: 23 U/L (ref 0–44)
ANION GAP: 8 (ref 5–15)
AST: 26 U/L (ref 15–41)
Albumin: 4.2 g/dL (ref 3.5–5.0)
BUN: 14 mg/dL (ref 8–23)
CALCIUM: 9.5 mg/dL (ref 8.9–10.3)
CHLORIDE: 100 mmol/L (ref 98–111)
CO2: 27 mmol/L (ref 22–32)
Creatinine, Ser: 0.8 mg/dL (ref 0.44–1.00)
GFR calc non Af Amer: >60 mL/min (ref >60)
Glucose, Bld: 121 mg/dL — ABNORMAL HIGH (ref 70–99)
POTASSIUM: 4.4 mmol/L (ref 3.5–5.1)
SODIUM: 135 mmol/L (ref 135–145)
Total Bilirubin: 0.9 mg/dL (ref 0.3–1.2)
Total Protein: 6.9 g/dL (ref 6.5–8.1)

## 2018-03-24 LAB — HEMOGLOBIN A1C
Hgb A1c MFr Bld: 5.8 % — ABNORMAL HIGH (ref 4.8–5.6)
MEAN PLASMA GLUCOSE: 119.76 mg/dL

## 2018-03-24 LAB — ABO/RH: ABO/RH(D): A POS

## 2018-03-28 ENCOUNTER — Telehealth: Payer: Self-pay | Admitting: Podiatry

## 2018-03-28 NOTE — Telephone Encounter (Signed)
Called and left a voicemail letting pt know she would need to fill out and sign a medical records release form before I could release her medical records to Dr. Victorino DikeHewitt. Asked pt to call me back directly at [phone] to let me know how she would like to obtain the form to fill out and sign.

## 2018-03-28 NOTE — Telephone Encounter (Signed)
I am requesting my medical records be sent to [facility]. I would appreciate a call back that it has been done or that you have received my message. My number is [phone]. Thank you.

## 2018-03-28 NOTE — Telephone Encounter (Signed)
Pt called back asking if I could e-mail her the release form and once she has completed it and signed it she would e-mail it back to me. Pt gave me the e-mail address of [email or web address] to send the medical records release form to.

## 2018-03-29 ENCOUNTER — Encounter (HOSPITAL_COMMUNITY): Payer: Self-pay | Admitting: Anesthesiology

## 2018-03-29 NOTE — Anesthesia Preprocedure Evaluation (Addendum)
Anesthesia Evaluation    Airway Mallampati: I  TM Distance: >3 FB Neck ROM: Full   Comment: Hypertrophied tonsils Dental no notable dental hx. (+) Teeth Intact   Pulmonary neg pulmonary ROS,  Snores   Pulmonary exam normal breath sounds clear to auscultation       Cardiovascular hypertension, Pt. on medications Normal cardiovascular exam Rhythm:Regular Rate:Normal  EKG- RBBB pattern   Neuro/Psych PSYCHIATRIC DISORDERS Depression negative neurological ROS     GI/Hepatic Neg liver ROS, GERD  Medicated and Controlled,Colon Mass   Endo/Other  Hyperlipidemia  Renal/GU negative Renal ROS  negative genitourinary   Musculoskeletal  (+) Arthritis , Osteoarthritis,  Psoriasis Scoliosis  Low back pain   Abdominal (+) + obese,   Peds  Hematology negative hematology ROS (+)   Anesthesia Other Findings   Reproductive/Obstetrics                           Anesthesia Physical Anesthesia Plan  ASA: III  Anesthesia Plan: General   Post-op Pain Management:    Induction: Intravenous  PONV Risk Score and Plan: 4 or greater and Ondansetron, Dexamethasone, Treatment may vary due to age or medical condition and Scopolamine patch - Pre-op  Airway Management Planned: Oral ETT  Additional Equipment:   Intra-op Plan:   Post-operative Plan: Extubation in OR  Informed Consent: I have reviewed the patients History and Physical, chart, labs and discussed the procedure including the risks, benefits and alternatives for the proposed anesthesia with the patient or authorized representative who has indicated his/her understanding and acceptance.   Dental advisory given  Plan Discussed with: CRNA and Surgeon  Anesthesia Plan Comments:        Anesthesia Quick Evaluation

## 2018-03-30 ENCOUNTER — Inpatient Hospital Stay (HOSPITAL_COMMUNITY): Payer: Medicare Other | Admitting: Anesthesiology

## 2018-03-30 ENCOUNTER — Other Ambulatory Visit: Payer: Self-pay

## 2018-03-30 ENCOUNTER — Inpatient Hospital Stay (HOSPITAL_COMMUNITY)
Admission: RE | Admit: 2018-03-30 | Discharge: 2018-04-01 | DRG: 331 | Disposition: A | Discharge status: Home / Self Care | Payer: Medicare Other | Source: Ambulatory Visit | Attending: Surgery | Admitting: Surgery

## 2018-03-30 ENCOUNTER — Encounter (HOSPITAL_COMMUNITY): Payer: Self-pay | Admitting: Anesthesiology

## 2018-03-30 ENCOUNTER — Encounter (HOSPITAL_COMMUNITY)
Admission: RE | Disposition: A | Discharge status: Home / Self Care | Payer: Self-pay | Source: Ambulatory Visit | Attending: Surgery

## 2018-03-30 DIAGNOSIS — Z6834 Body mass index (BMI) 34.0-34.9, adult: Secondary | ICD-10-CM

## 2018-03-30 DIAGNOSIS — K219 Gastro-esophageal reflux disease without esophagitis: Secondary | ICD-10-CM | POA: Diagnosis present

## 2018-03-30 DIAGNOSIS — K6389 Other specified diseases of intestine: Secondary | ICD-10-CM | POA: Diagnosis present

## 2018-03-30 DIAGNOSIS — E785 Hyperlipidemia, unspecified: Secondary | ICD-10-CM | POA: Diagnosis present

## 2018-03-30 DIAGNOSIS — I10 Essential (primary) hypertension: Secondary | ICD-10-CM | POA: Diagnosis present

## 2018-03-30 DIAGNOSIS — E669 Obesity, unspecified: Secondary | ICD-10-CM | POA: Diagnosis present

## 2018-03-30 DIAGNOSIS — L4050 Arthropathic psoriasis, unspecified: Secondary | ICD-10-CM | POA: Diagnosis present

## 2018-03-30 DIAGNOSIS — Z96652 Presence of left artificial knee joint: Secondary | ICD-10-CM | POA: Diagnosis present

## 2018-03-30 HISTORY — PX: LAPAROSCOPIC RIGHT HEMI COLECTOMY: SHX5926

## 2018-03-30 LAB — TYPE AND SCREEN
ABO/RH(D): A POS
ANTIBODY SCREEN: NEGATIVE

## 2018-03-30 SURGERY — LAPAROSCOPIC RIGHT HEMI COLECTOMY
Anesthesia: General | Laterality: Right

## 2018-03-30 MED ORDER — LACTATED RINGERS IR SOLN
Status: DC | PRN
  Administered 2018-03-30: 1000 mL

## 2018-03-30 MED ORDER — IBUPROFEN 400 MG PO TABS
600.0000 mg | ORAL_TABLET | Freq: Four times a day (QID) | ORAL | Status: DC
  Administered 2018-03-30 – 2018-04-01 (×8): 600 mg via ORAL
  Filled 2018-03-30 (×8): qty 1

## 2018-03-30 MED ORDER — LIDOCAINE 2% (20 MG/ML) 5 ML SYRINGE
INTRAMUSCULAR | Status: DC | PRN
  Administered 2018-03-30: 100 mg via INTRAVENOUS

## 2018-03-30 MED ORDER — ROCURONIUM BROMIDE 100 MG/10ML IV SOLN
INTRAVENOUS | Status: AC
  Filled 2018-03-30: qty 1

## 2018-03-30 MED ORDER — ALUM & MAG HYDROXIDE-SIMETH 200-200-20 MG/5ML PO SUSP: 30.0000 mL | Freq: Four times a day (QID) | ORAL | Status: DC | PRN

## 2018-03-30 MED ORDER — PROPOFOL 10 MG/ML IV BOLUS
INTRAVENOUS | Status: AC
  Filled 2018-03-30: qty 20

## 2018-03-30 MED ORDER — CHLORHEXIDINE GLUCONATE CLOTH 2 % EX PADS: 6.0000 | MEDICATED_PAD | Freq: Once | CUTANEOUS | Status: DC

## 2018-03-30 MED ORDER — AMLODIPINE BESYLATE 5 MG PO TABS
5.0000 mg | ORAL_TABLET | Freq: Every day | ORAL | Status: DC
  Administered 2018-03-30 – 2018-03-31 (×2): 5 mg via ORAL
  Filled 2018-03-30 (×2): qty 1

## 2018-03-30 MED ORDER — ONDANSETRON HCL 4 MG/2ML IJ SOLN
INTRAMUSCULAR | Status: DC | PRN
  Administered 2018-03-30: 4 mg via INTRAVENOUS

## 2018-03-30 MED ORDER — SIMVASTATIN 20 MG PO TABS
20.0000 mg | ORAL_TABLET | Freq: Every day | ORAL | Status: DC
  Administered 2018-03-30 – 2018-03-31 (×2): 20 mg via ORAL
  Filled 2018-03-30 (×2): qty 1

## 2018-03-30 MED ORDER — DIPHENHYDRAMINE HCL 50 MG/ML IJ SOLN: 12.5000 mg | Freq: Four times a day (QID) | INTRAMUSCULAR | Status: DC | PRN

## 2018-03-30 MED ORDER — PHENYLEPHRINE 40 MCG/ML (10ML) SYRINGE FOR IV PUSH (FOR BLOOD PRESSURE SUPPORT)
PREFILLED_SYRINGE | INTRAVENOUS | Status: AC
  Filled 2018-03-30: qty 10

## 2018-03-30 MED ORDER — METRONIDAZOLE 500 MG PO TABS: 1000.0000 mg | ORAL_TABLET | ORAL | Status: DC

## 2018-03-30 MED ORDER — LACTATED RINGERS IV SOLN
INTRAVENOUS | Status: DC
  Administered 2018-03-30 (×2): via INTRAVENOUS

## 2018-03-30 MED ORDER — FENTANYL CITRATE (PF) 250 MCG/5ML IJ SOLN
INTRAMUSCULAR | Status: DC | PRN
  Administered 2018-03-30: 50 ug via INTRAVENOUS
  Administered 2018-03-30: 100 ug via INTRAVENOUS
  Administered 2018-03-30 (×2): 50 ug via INTRAVENOUS

## 2018-03-30 MED ORDER — HEPARIN SODIUM (PORCINE) 5000 UNIT/ML IJ SOLN
5000.0000 [IU] | Freq: Once | INTRAMUSCULAR | Status: AC
  Administered 2018-03-30: 5000 [IU] via SUBCUTANEOUS
  Filled 2018-03-30: qty 1

## 2018-03-30 MED ORDER — ROCURONIUM BROMIDE 100 MG/10ML IV SOLN
INTRAVENOUS | Status: DC | PRN
  Administered 2018-03-30 (×3): 10 mg via INTRAVENOUS
  Administered 2018-03-30: 60 mg via INTRAVENOUS

## 2018-03-30 MED ORDER — ONDANSETRON HCL 4 MG/2ML IJ SOLN: 4.0000 mg | Freq: Four times a day (QID) | INTRAMUSCULAR | Status: DC | PRN

## 2018-03-30 MED ORDER — ACETAMINOPHEN 500 MG PO TABS
1000.0000 mg | ORAL_TABLET | Freq: Four times a day (QID) | ORAL | Status: DC
  Administered 2018-03-30 – 2018-04-01 (×8): 1000 mg via ORAL
  Filled 2018-03-30 (×8): qty 2

## 2018-03-30 MED ORDER — HYDROMORPHONE HCL 1 MG/ML IJ SOLN
INTRAMUSCULAR | Status: AC
  Filled 2018-03-30: qty 1

## 2018-03-30 MED ORDER — KETAMINE HCL 10 MG/ML IJ SOLN
INTRAMUSCULAR | Status: DC | PRN
  Administered 2018-03-30: 40 mg via INTRAVENOUS

## 2018-03-30 MED ORDER — MIDAZOLAM HCL 2 MG/2ML IJ SOLN
INTRAMUSCULAR | Status: DC | PRN
  Administered 2018-03-30: 2 mg via INTRAVENOUS

## 2018-03-30 MED ORDER — LACTATED RINGERS IV SOLN
INTRAVENOUS | Status: DC
  Administered 2018-03-30: 17:00:00 via INTRAVENOUS

## 2018-03-30 MED ORDER — SCOPOLAMINE 1 MG/3DAYS TD PT72
1.0000 | MEDICATED_PATCH | TRANSDERMAL | Status: DC
  Administered 2018-03-30: 1.5 mg via TRANSDERMAL
  Filled 2018-03-30: qty 1

## 2018-03-30 MED ORDER — ONDANSETRON HCL 4 MG/2ML IJ SOLN
INTRAMUSCULAR | Status: AC
  Filled 2018-03-30: qty 2

## 2018-03-30 MED ORDER — SUGAMMADEX SODIUM 200 MG/2ML IV SOLN
INTRAVENOUS | Status: AC
  Filled 2018-03-30: qty 2

## 2018-03-30 MED ORDER — FENTANYL CITRATE (PF) 250 MCG/5ML IJ SOLN
INTRAMUSCULAR | Status: AC
  Filled 2018-03-30: qty 5

## 2018-03-30 MED ORDER — CITALOPRAM HYDROBROMIDE 20 MG PO TABS
20.0000 mg | ORAL_TABLET | Freq: Every day | ORAL | Status: DC
  Administered 2018-03-31: 20 mg via ORAL
  Filled 2018-03-30: qty 1

## 2018-03-30 MED ORDER — ENSURE SURGERY PO LIQD
237.0000 mL | Freq: Two times a day (BID) | ORAL | Status: DC
  Administered 2018-03-31: 237 mL via ORAL
  Filled 2018-03-30 (×5): qty 237

## 2018-03-30 MED ORDER — MEPERIDINE HCL 50 MG/ML IJ SOLN: 6.2500 mg | INTRAMUSCULAR | Status: DC | PRN

## 2018-03-30 MED ORDER — NEOMYCIN SULFATE 500 MG PO TABS: 1000.0000 mg | ORAL_TABLET | ORAL | Status: DC

## 2018-03-30 MED ORDER — PANTOPRAZOLE SODIUM 40 MG PO TBEC
40.0000 mg | DELAYED_RELEASE_TABLET | Freq: Every day | ORAL | Status: DC
  Administered 2018-03-31: 40 mg via ORAL
  Filled 2018-03-30: qty 1

## 2018-03-30 MED ORDER — ACETAMINOPHEN 500 MG PO TABS
1000.0000 mg | ORAL_TABLET | ORAL | Status: AC
  Administered 2018-03-30: 1000 mg via ORAL
  Filled 2018-03-30: qty 2

## 2018-03-30 MED ORDER — HYDRALAZINE HCL 20 MG/ML IJ SOLN: 10.0000 mg | INTRAMUSCULAR | Status: DC | PRN

## 2018-03-30 MED ORDER — SALINE SPRAY 0.65 % NA SOLN
1.0000 | NASAL | Status: DC | PRN
  Filled 2018-03-30: qty 44

## 2018-03-30 MED ORDER — DEXAMETHASONE SODIUM PHOSPHATE 10 MG/ML IJ SOLN
INTRAMUSCULAR | Status: DC | PRN
  Administered 2018-03-30: 10 mg via INTRAVENOUS

## 2018-03-30 MED ORDER — POLYETHYLENE GLYCOL 3350 17 GM/SCOOP PO POWD: 1.0000 | Freq: Once | ORAL | Status: DC

## 2018-03-30 MED ORDER — 0.9 % SODIUM CHLORIDE (POUR BTL) OPTIME
TOPICAL | Status: DC | PRN
  Administered 2018-03-30: 2000 mL

## 2018-03-30 MED ORDER — PHENYLEPHRINE 40 MCG/ML (10ML) SYRINGE FOR IV PUSH (FOR BLOOD PRESSURE SUPPORT)
PREFILLED_SYRINGE | INTRAVENOUS | Status: DC | PRN
  Administered 2018-03-30 (×5): 80 ug via INTRAVENOUS

## 2018-03-30 MED ORDER — GABAPENTIN 300 MG PO CAPS
300.0000 mg | ORAL_CAPSULE | ORAL | Status: AC
  Administered 2018-03-30: 300 mg via ORAL
  Filled 2018-03-30: qty 1

## 2018-03-30 MED ORDER — SODIUM CHLORIDE 0.9 % IV SOLN
2.0000 g | INTRAVENOUS | Status: AC
  Administered 2018-03-30: 2 g via INTRAVENOUS
  Filled 2018-03-30: qty 2

## 2018-03-30 MED ORDER — MIDAZOLAM HCL 2 MG/2ML IJ SOLN
INTRAMUSCULAR | Status: AC
  Filled 2018-03-30: qty 2

## 2018-03-30 MED ORDER — HYDROMORPHONE HCL 1 MG/ML IJ SOLN
0.5000 mg | INTRAMUSCULAR | Status: DC | PRN
  Administered 2018-03-30: 0.5 mg via INTRAVENOUS
  Filled 2018-03-30: qty 0.5

## 2018-03-30 MED ORDER — OLMESARTAN-AMLODIPINE-HCTZ 40-5-25 MG PO TABS: 1.0000 | ORAL_TABLET | Freq: Every day | ORAL | Status: DC

## 2018-03-30 MED ORDER — BUPIVACAINE-EPINEPHRINE (PF) 0.25% -1:200000 IJ SOLN
INTRAMUSCULAR | Status: AC
  Filled 2018-03-30: qty 30

## 2018-03-30 MED ORDER — DIPHENHYDRAMINE HCL 12.5 MG/5ML PO ELIX: 12.5000 mg | ORAL_SOLUTION | Freq: Four times a day (QID) | ORAL | Status: DC | PRN

## 2018-03-30 MED ORDER — HEPARIN SODIUM (PORCINE) 5000 UNIT/ML IJ SOLN
5000.0000 [IU] | Freq: Three times a day (TID) | INTRAMUSCULAR | Status: DC
  Administered 2018-03-31 – 2018-04-01 (×5): 5000 [IU] via SUBCUTANEOUS
  Filled 2018-03-30 (×6): qty 1

## 2018-03-30 MED ORDER — PROPOFOL 10 MG/ML IV BOLUS
INTRAVENOUS | Status: DC | PRN
  Administered 2018-03-30: 100 mg via INTRAVENOUS

## 2018-03-30 MED ORDER — SODIUM CHLORIDE 0.9 % IJ SOLN
INTRAMUSCULAR | Status: AC
  Filled 2018-03-30: qty 50

## 2018-03-30 MED ORDER — TACROLIMUS 0.1 % EX OINT: 1.0000 "application " | TOPICAL_OINTMENT | Freq: Every day | CUTANEOUS | Status: DC | PRN

## 2018-03-30 MED ORDER — ONDANSETRON HCL 4 MG PO TABS: 4.0000 mg | ORAL_TABLET | Freq: Four times a day (QID) | ORAL | Status: DC | PRN

## 2018-03-30 MED ORDER — ALVIMOPAN 12 MG PO CAPS
12.0000 mg | ORAL_CAPSULE | ORAL | Status: AC
  Administered 2018-03-30: 12 mg via ORAL
  Filled 2018-03-30: qty 1

## 2018-03-30 MED ORDER — SACCHAROMYCES BOULARDII 250 MG PO CAPS
250.0000 mg | ORAL_CAPSULE | Freq: Two times a day (BID) | ORAL | Status: DC
  Administered 2018-03-30 – 2018-03-31 (×4): 250 mg via ORAL
  Filled 2018-03-30 (×4): qty 1

## 2018-03-30 MED ORDER — LIDOCAINE 2% (20 MG/ML) 5 ML SYRINGE
INTRAMUSCULAR | Status: AC
  Filled 2018-03-30: qty 5

## 2018-03-30 MED ORDER — KETAMINE HCL 10 MG/ML IJ SOLN
INTRAMUSCULAR | Status: AC
  Filled 2018-03-30: qty 1

## 2018-03-30 MED ORDER — DEXAMETHASONE SODIUM PHOSPHATE 10 MG/ML IJ SOLN
INTRAMUSCULAR | Status: AC
  Filled 2018-03-30: qty 1

## 2018-03-30 MED ORDER — TRAMADOL HCL 50 MG PO TABS: 50.0000 mg | ORAL_TABLET | Freq: Four times a day (QID) | ORAL | Status: DC | PRN

## 2018-03-30 MED ORDER — SUGAMMADEX SODIUM 200 MG/2ML IV SOLN
INTRAVENOUS | Status: DC | PRN
  Administered 2018-03-30: 200 mg via INTRAVENOUS

## 2018-03-30 MED ORDER — BUPIVACAINE LIPOSOME 1.3 % IJ SUSP
20.0000 mL | Freq: Once | INTRAMUSCULAR | Status: AC
  Administered 2018-03-30: 20 mL
  Filled 2018-03-30: qty 20

## 2018-03-30 MED ORDER — IRBESARTAN 150 MG PO TABS
300.0000 mg | ORAL_TABLET | Freq: Every day | ORAL | Status: DC
  Administered 2018-03-30 – 2018-03-31 (×2): 300 mg via ORAL
  Filled 2018-03-30 (×2): qty 2

## 2018-03-30 MED ORDER — LIDOCAINE 2% (20 MG/ML) 5 ML SYRINGE
INTRAMUSCULAR | Status: DC | PRN
  Administered 2018-03-30: 1.5 mg/kg/h via INTRAVENOUS

## 2018-03-30 MED ORDER — ASPIRIN EC 81 MG PO TBEC
81.0000 mg | DELAYED_RELEASE_TABLET | Freq: Every evening | ORAL | Status: DC
  Administered 2018-03-31: 81 mg via ORAL
  Filled 2018-03-30: qty 1

## 2018-03-30 MED ORDER — BUPIVACAINE-EPINEPHRINE 0.25% -1:200000 IJ SOLN
INTRAMUSCULAR | Status: DC | PRN
  Administered 2018-03-30: 30 mL

## 2018-03-30 MED ORDER — LIDOCAINE HCL 2 % IJ SOLN
INTRAMUSCULAR | Status: AC
  Filled 2018-03-30: qty 20

## 2018-03-30 MED ORDER — METOCLOPRAMIDE HCL 5 MG/ML IJ SOLN: 10.0000 mg | Freq: Once | INTRAMUSCULAR | Status: DC | PRN

## 2018-03-30 MED ORDER — ALVIMOPAN 12 MG PO CAPS
12.0000 mg | ORAL_CAPSULE | Freq: Two times a day (BID) | ORAL | Status: DC
  Filled 2018-03-30 (×3): qty 1

## 2018-03-30 MED ORDER — HYDROMORPHONE HCL 1 MG/ML IJ SOLN
0.2500 mg | INTRAMUSCULAR | Status: DC | PRN
  Administered 2018-03-30 (×2): 0.5 mg via INTRAVENOUS

## 2018-03-30 MED ORDER — HYDROCHLOROTHIAZIDE 25 MG PO TABS
25.0000 mg | ORAL_TABLET | Freq: Every day | ORAL | Status: DC
  Administered 2018-03-30 – 2018-03-31 (×2): 25 mg via ORAL
  Filled 2018-03-30 (×2): qty 1

## 2018-03-30 SURGICAL SUPPLY — 64 items
ADH SKN CLS APL DERMABOND .7 (GAUZE/BANDAGES/DRESSINGS) ×1
APPLIER CLIP ROT 10 11.4 M/L (STAPLE)
APR CLP MED LRG 11.4X10 (STAPLE)
BLADE CLIPPER SURG (BLADE) IMPLANT
CABLE HIGH FREQUENCY MONO STRZ (ELECTRODE) ×4 IMPLANT
CELLS DAT CNTRL 66122 CELL SVR (MISCELLANEOUS) IMPLANT
CLIP APPLIE ROT 10 11.4 M/L (STAPLE) IMPLANT
DECANTER SPIKE VIAL GLASS SM (MISCELLANEOUS) ×3 IMPLANT
DERMABOND ADVANCED (GAUZE/BANDAGES/DRESSINGS) ×2
DERMABOND ADVANCED .7 DNX12 (GAUZE/BANDAGES/DRESSINGS) ×1 IMPLANT
DISSECTOR BLUNT TIP ENDO 5MM (MISCELLANEOUS) IMPLANT
DRSG OPSITE POSTOP 4X6 (GAUZE/BANDAGES/DRESSINGS) ×2 IMPLANT
ELECT PENCIL ROCKER SW 15FT (MISCELLANEOUS) ×2 IMPLANT
ELECT REM PT RETURN 15FT ADLT (MISCELLANEOUS) ×3 IMPLANT
GLOVE BIO SURGEON STRL SZ 6.5 (GLOVE) ×1 IMPLANT
GLOVE BIO SURGEON STRL SZ7 (GLOVE) ×2 IMPLANT
GLOVE BIO SURGEON STRL SZ7.5 (GLOVE) ×6 IMPLANT
GLOVE BIO SURGEONS STRL SZ 6.5 (GLOVE) ×1
GLOVE BIOGEL PI IND STRL 6.5 (GLOVE) IMPLANT
GLOVE BIOGEL PI IND STRL 7.0 (GLOVE) IMPLANT
GLOVE BIOGEL PI INDICATOR 6.5 (GLOVE) ×2
GLOVE BIOGEL PI INDICATOR 7.0 (GLOVE) ×2
GLOVE ECLIPSE 8.0 STRL XLNG CF (GLOVE) ×6 IMPLANT
GOWN STRL REUS W/ TWL LRG LVL3 (GOWN DISPOSABLE) IMPLANT
GOWN STRL REUS W/TWL LRG LVL3 (GOWN DISPOSABLE) ×6
GOWN STRL REUS W/TWL XL LVL3 (GOWN DISPOSABLE) ×16 IMPLANT
LIGASURE IMPACT 36 18CM CVD LR (INSTRUMENTS) IMPLANT
PACK COLON (CUSTOM PROCEDURE TRAY) ×3 IMPLANT
PAD POSITIONING PINK XL (MISCELLANEOUS) ×2 IMPLANT
POSITIONER SURGICAL ARM (MISCELLANEOUS) IMPLANT
RELOAD PROXIMATE 75MM BLUE (ENDOMECHANICALS) ×6 IMPLANT
RTRCTR WOUND ALEXIS 18CM MED (MISCELLANEOUS)
SCISSORS LAP 5X35 DISP (ENDOMECHANICALS) ×3 IMPLANT
SEALER TISSUE G2 STRG ARTC 35C (ENDOMECHANICALS) ×2 IMPLANT
SET IRRIG TUBING LAPAROSCOPIC (IRRIGATION / IRRIGATOR) ×2 IMPLANT
SHEARS HARMONIC ACE PLUS 36CM (ENDOMECHANICALS) IMPLANT
SLEEVE ADV FIXATION 5X100MM (TROCAR) ×6 IMPLANT
SLEEVE ENDOPATH XCEL 5M (ENDOMECHANICALS) ×3 IMPLANT
STAPLER 90 3.5 STAND SLIM (STAPLE) ×3
STAPLER 90 3.5 STD SLIM (STAPLE) IMPLANT
STAPLER PROXIMATE 75MM BLUE (STAPLE) ×2 IMPLANT
STAPLER VISISTAT 35W (STAPLE) ×1 IMPLANT
SUT MNCRL AB 4-0 PS2 18 (SUTURE) ×5 IMPLANT
SUT PDS AB 1 CT1 27 (SUTURE) ×6 IMPLANT
SUT PROLENE 2 0 CT2 30 (SUTURE) IMPLANT
SUT PROLENE 2 0 KS (SUTURE) IMPLANT
SUT SILK 2 0 (SUTURE) ×3
SUT SILK 2 0 SH CR/8 (SUTURE) ×3 IMPLANT
SUT SILK 2-0 18XBRD TIE 12 (SUTURE) ×1 IMPLANT
SUT SILK 3 0 (SUTURE) ×3
SUT SILK 3 0 SH CR/8 (SUTURE) ×5 IMPLANT
SUT SILK 3-0 18XBRD TIE 12 (SUTURE) ×1 IMPLANT
SYS LAPSCP GELPORT 120MM (MISCELLANEOUS)
SYSTEM LAPSCP GELPORT 120MM (MISCELLANEOUS) IMPLANT
TAPE CLOTH 4X10 WHT NS (GAUZE/BANDAGES/DRESSINGS) IMPLANT
TOWEL OR 17X26 10 PK STRL BLUE (TOWEL DISPOSABLE) ×3 IMPLANT
TRAY FOLEY MTR SLVR 14FR STAT (SET/KITS/TRAYS/PACK) ×3 IMPLANT
TROCAR ADV FIXATION 5X100MM (TROCAR) ×3 IMPLANT
TROCAR BALLN 12MMX100 BLUNT (TROCAR) ×3 IMPLANT
TROCAR XCEL NON-BLD 11X100MML (ENDOMECHANICALS) IMPLANT
TUBING CONNECTING 10 (TUBING) ×4 IMPLANT
TUBING CONNECTING 10' (TUBING) ×2
TUBING INSUF HEATED (TUBING) ×3 IMPLANT
YANKAUER SUCT BULB TIP NO VENT (SUCTIONS) ×3 IMPLANT

## 2018-03-30 NOTE — Op Note (Signed)
PATIENT: Pamela DeerJudy M Malone  67 y.o. female  Patient Care Team: Juluis RainierBarnes, Elizabeth, MD as PCP - General (Family Medicine)  PREOP DIAGNOSIS: COLON MASS - cecum/ascending  POSTOP DIAGNOSIS: Same  PROCEDURE: Laparoscopic right hemicolectomy with stapled ileocolic anastomosis  SURGEON: Stephanie Couphristopher M. Cliffton AstersWhite, MD  ASSISTANT: Karie SodaSteven Gross, MD  ANESTHESIA: General endotracheal  EBL: 50cc Total I/O In: 1000 [I.V.:1000] Out: 175 [Urine:125; Blood:50]  DRAINS: None  SPECIMEN: Terminal ileum with right colon  COUNTS: Sponge, needle and instrument counts were reported correct x2  FINDINGS:  Tattoo found at proximal ascending colon. Laparoscopic right hemicolectomy with stapled ileocolic anastomosis to transverse colon - including right branch of middle colic with the specimen. Specimen opened on back table and thickened, fibrotic/firm area identified in cecum proximal to tattoo.  No evidence of metastatic disease on surface of liver or visualized peritoneum.  STATEMENT OF MEDICAL NECESSITY: Pamela Malone is a very pleasant 67yoF referred to me by Dr. Marca AnconaKarki for evaluation of a ulcerated mass like lesion of the ascending colon which was tattooed distally. She developed acute onset right-sided abdominal discomfort 12/2017 and underwent evaluation. She reports not having had fevers but did have a Sophiarose Eades blood cell count of 26. CT abdomen and pelvis demonstrated an inflamed cecum and edematous pericecal fat but normal appearing appendix. Considered acute infectious cecal colitis or typhlitis but possibly a neoplasm. The distal small bowel appeared normal. There is no free fluid, lymphadenopathy or other complicated features. Incidentally noted gallstones  Colonoscopy by Dr. Marca AnconaKarki 01/27/18 demonstrated perianal skin tags, hemorrhoids, localized area of severely inflamed, ulcerated and thickened folds of mucosa in the ascending colon opposite of the ileocecal valve-question malignant ulcerated mass.  The area appeared erythematous, ulcerated, friable. Biopsies were taken in the area around this area was injected with tattoo. Pathology returned benign ulcerated colonic mucosa, negative for dysplasia and malignancy.  She saw Dr. Marca AnconaKarki in follow-up she subsequently referred her to us for consideration of surgery to rule out this being a malignant process; differential including lymphoma, infectious colitis, possibly Crohn's, focal ischemic colitis although atypical location, colon adenocarcinoma  I had a long discussionin clinicwith the patient and her husbandand then againtoday regarding all of the above. We discussed the fact that at this time, biopsies have not revealed this to be a malignancy. Differential diagnosis remains quite wide at this time including lymphoma, infectious colitis, Crohn's, focal ischemic colitis although atypical location, colon adenocarcinoma, other malignancy.  She opted to proceed with surgeryafter discussion of the above in clinic. We will plan laparoscopic vs open right hemicolectomytoday.  The anatomy and physiology of the GI tract was discussed at length with the patient. The pathophysiology of all of the above including malignancy and polyps was discussed at length with associated pictures.  The planned procedure, material risks (including, but not limited to, pain, bleeding, infection, scarring, need for blood transfusion, damage to surrounding structures- blood vessels/nerves/viscus/organs, damage to ureter, urine leak, leak from anastomosis, need for additional procedures, need for stoma which may be permanent, hernia, worsening of pre-existing medical conditions, recurrence - particularly if this was cancer, pneumonia, heart attack, stroke, death) benefits and alternatives to surgery were discussed at length. The patient's questions were answered to her satisfaction, she voiced understanding and elected to proceed with surgery. Additionally, we discussed  typical postoperative expectations and the recovery process.   NARRATIVE:  The patient was identified & brought into the operating room, placed supine on the operating table and SCDs were applied to the lower extremities. General  endotracheal anesthesia was induced. The patient was positioned supine with arms tucked. Pressure points were padded and verified. Antibiotics were administered. A foley catheter was placed under sterile conditions. The abdomen was prepped and draped in a sterile fashion. A timeout was performed confirming our patient and plan.   A supraumbilical small midline incision was made at the location of the planned extraction site of the specimen.  This was carried down to the subcutaneous tissue to level the fascia.  The linea alba was incised in the midline.  The peritoneum was identified grasped elevated and opened sharply.  The incision was widened to accommodate an Horticulturist, commercial.  Alexis wound protector was placed with a.  Within the [place], a 12 mm port was in place.  The abdomen was then insufflated with CO2 to a maximum pressure of 15 mmHg.  Bilateral TAP blocks were then performed using a dilute mixture of Exparel and Marcaine with epinephrine  3 additional ports were then placed under direct laparoscopic visualization -2 in the left abdomen and one in the right. The abdomen was surveyed. The liver and peritoneum appeared normal.  There were no signs of metastatic disease.  The patient was positioned in trendelenburg with left side down. The ileocolic pedicle was identified and elevated. Gentle blunt dissection commenced around the pedicle and the duodenum was identified and freed from the surrounding structures.  The plane was developed cephalad up to the level of the hepatic flexure.  Throat is was clearly down as was the duodenum.  The ileocolic pedicle had been isolated.  A window was created in the ileocolic mesentery so the pedicle had been circumferentially  dissected.  A high ligation was then performed at this level after confirming again the duodenum was down and well away from our site of transection using the Enseal device.   The appendix and terminal ileal mesentery was then dissected off the retroperitoneum carefully.  The cecum and ascending colon was then mobilized lateral to medial along the Bitha Fauteux line of [place].  At the level of the hepatic flexure, the omentum was encountered.  The patient was then repositioned in reverse Trendelenburg and the omentum was elevated cephalad.  This was subsequently taken off of the transverse colon.  The lesser sac was then entered.  Additional attachments of the omentum and transverse colon were taken down gently off of the gallbladder, taking care to protect this from injury.  The hepatic flexure was further mobilized.  The entire colon was then flipped medially and mobilized off of the retroperitoneal structures until I could visualize the lateral edge of the duodenum underneath.  At that point, I desufflated the abdomen and removed the wound protector cap. Blue towels were draped out around the field. The terminal ileum and right colon were then delivered onto the field. A window was created in the terminal ileum mesentery on the mesenteric border of the small bowel. The terminal ileum was transected using a GIA blue load stapler. The distal point of transection was selected on the transverse colon such that the right branch of the middle colic vessels was included in the resection. A window was created on the mesenteric border of the colon at this level and the colon transected using another blue load GIA stapler. The intervening mesentery was ligated using the Enseal device and the specimen passed off and opened on the back table - see findings above for details. Gown/gloves were then changed.  Attention was then turned to creating the ileocolic anastomosis  between the distal ileum and the transverse colon. There  was a palpable pulse in the mesentery at both sites of transection and staple lines were oozing. Staple line oozers were controlled with 3-0 silk sutures.  The ileum was aligned with the transverse colon and orientation was confirmed. There was no twisting of the ileum or mesentery. The antimesenteric corners of each respective staple line were removed and the anastomosis fashioned using a GIA 75mm blue load stapler. The staple line was then inspected and noted to be hemostatic.  The common enterotomy channel was closed using a TA 90 blue load stapler. 2 small staple line bleeders were controlled with 3-0 silk U stitches. 3-0 silk sutures were used to imbricate the corners of the anastomosis. Two "crotch" sutures were placed at the end of the anastomotic staple line.  This was then placed back into the abdomen and covered with omentum. The Alexis wound protector was removed, and we switched to clean instruments, gowns and drapes.   The fascia was then closed using two running #1 PDS sutures. Wounds were then irrigated with sterile saline. The skin was then closed using 4-0 monocryl sutures. Dermabond was placed on the port sites and a sterile dressing was placed over the abdominal incision. All counts were reported correct x2 per operating room staff. The patient was then awakened from anesthesia and sent to the post anesthesia care unit in stable condition.   DISPOSITION: PACU in satisfactory condition   Note: This dictation was prepared with Dragon/digital dictation along with [employer]. Any transcriptional errors that result from this process are unintentional.

## 2018-03-30 NOTE — Progress Notes (Signed)
CC: Here today for surgery  HPI: Pamela Malone is a very pleasant 67yoF referred to me by Pamela. Marca Malone for evaluation of a ulcerated mass like lesion of the ascending colon which was tattooed distally. She developed acute onset right-sided abdominal discomfort 12/2017 and underwent evaluation. She reports not having had fevers but did have a Pamela Malone blood cell count of 26. CT abdomen and pelvis demonstrated an inflamed cecum and edematous pericecal fat but normal appearing appendix. Considered acute infectious cecal colitis or typhlitis but possibly a neoplasm. The distal small bowel appeared normal. There is no free fluid, lymphadenopathy or other complicated features. Incidentally noted gallstones  Colonoscopy by Pamela. Marca Malone 01/27/18 demonstrated perianal skin tags, hemorrhoids, localized area of severely inflamed, ulcerated and thickened folds of mucosa in the ascending colon opposite of the ileocecal valve-question malignant ulcerated mass. The area appeared erythematous, ulcerated, friable. Biopsies were taken in the area around this area was injected with tattoo. Pathology returned benign ulcerated colonic mucosa, negative for dysplasia and malignancy.  She saw Pamela. Marca Malone in follow-up he subsequently referred her here for consideration of surgery to rule out this being a malignant process; differential including lymphoma, infectious colitis, possibly Crohn's, focal ischemic colitis although atypical location, colon adenocarcinoma  She has been doing well and denies any complaints. She denies any changes in her health or history since we saw her in clinic. Tolerated her bowel prep.   PMH: Psoriatic arthritis for which she takes a low-dose methotrexate for - 7.5 mg by mouth per week; hyperlipidemia (well-controlled on simvastatin), GERD (is well controlled on PPI)  PSH: Left knee and left foot surgery; left thumb surgery. Denies any prior abdominal surgeries  FHx: Denies FHx of  malignancy  Social: Denies use of tobacco/drugs; drinks 1-2 glasses of wine most nights  ROS: A comprehensive 10 system review of systems was completed with the patient and pertinent findings as noted above.   Past Medical History:  Diagnosis Date  . Cataract    immature  . Depression    many years ago  . DJD (degenerative joint disease)   . GERD (gastroesophageal reflux disease)   . History of right bundle branch block (RBBB)    released after cardiac evaluation by Pamela Malone 12/2014; gave surgical clearance  . Hyperlipidemia   . Hypertension   . Low back pain   . Menopause   . Nocturia   . Psoriasis   . Scoliosis     Past Surgical History:  Procedure Laterality Date  . ANKLE SURGERY Right    from a previous injury as a child   . CARPAL TUNNEL RELEASE  bilateral  . DILATION AND CURETTAGE OF UTERUS  2009  . Left Thumb Joint Replacement    . 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 Hypertension Mother   . Stroke Mother   . Alzheimer's disease Mother   . Diabetes Paternal Grandmother   . Parkinsonism Paternal Grandmother   . Breast cancer Neg Hx     Social:  reports that she has never smoked. She has never used smokeless tobacco. She reports that she drinks alcohol. She reports that she does not use drugs.  Allergies: No Known Allergies  Medications: I have reviewed the patient's current medications.  No results found for this or any previous visit (from the past  48 hour(s)).  No results found.  ROS - all of the below systems have been reviewed with the patient and positives are indicated with bold text General: chills, fever or night sweats Eyes: blurry vision or double vision ENT: epistaxis or sore throat Allergy/Immunology: itchy/watery eyes or nasal congestion Hematologic/Lymphatic:  bleeding problems, blood clots or swollen lymph nodes Endocrine: temperature intolerance or unexpected weight changes Breast: new or changing breast lumps or nipple discharge Resp: cough, shortness of breath, or wheezing CV: chest pain or dyspnea on exertion GI: as per HPI GU: dysuria, trouble voiding, or hematuria MSK: joint pain or joint stiffness Neuro: TIA or stroke symptoms Derm: pruritus and skin lesion changes Psych: anxiety and depression  PE Blood pressure (!) 151/101, pulse 92, temperature 98.3 F (36.8 C), resp. rate 20, height 5\' 1"  (1.549 m), weight 82.6 kg (182 lb), SpO2 99 %. Constitutional: NAD; conversant; no deformities Eyes: Moist conjunctiva; no lid lag; anicteric; PERRL Neck: Trachea midline; no thyromegaly Lungs: Normal respiratory effort; no tactile fremitus CV: RRR; no palpable thrills; no pitting edema GI: Abd soft, NT/ND; no palpable hepatosplenomegaly MSK: Normal gait; no clubbing/cyanosis Psychiatric: Appropriate affect; alert and oriented x3 Lymphatic: No palpable cervical or axillary lymphadenopathy   A/P: Pamela Malone is a very pleasant 67yoF hx of psoriatic arthritis, HLD, GERD - here today for surgery for an ulcerated mass of the ascending colon - biopsies thus far have been benign  -I had a long discussion in clinic with the patient and her husband and then again today regarding all of the above. We discussed the fact that at this time, biopsies have not revealed this to be a malignancy. Differential diagnosis remains quite wide at this time including lymphoma, infectious colitis, Crohn's, focal ischemic colitis although atypical location, colon adenocarcinoma, other malignancy. -She opted to proceed with surgery after discussion of the above in clinic. We will plan laparoscopic vs open right hemicolectomy today. -The anatomy and physiology of the GI tract was discussed at length with the patient. The pathophysiology of all of the above including  malignancy and polyps was discussed at length with associated pictures. -The planned procedure, material risks (including, but not limited to, pain, bleeding, infection, scarring, need for blood transfusion, damage to surrounding structures- blood vessels/nerves/viscus/organs, damage to ureter, urine leak, leak from anastomosis, need for additional procedures, need for stoma which may be permanent, hernia, worsening of pre-existing medical conditions, recurrence - particularly if this was cancer, pneumonia, heart attack, stroke, death) benefits and alternatives to surgery were discussed at length. The patient's questions were answered to her satisfaction, she voiced understanding and elected to proceed with surgery. Additionally, we discussed typical postoperative expectations and the recovery process.  Pamela Malone. Cliffton Asters, M.D. General and Colorectal [facility]

## 2018-03-30 NOTE — Transfer of Care (Signed)
Immediate Anesthesia Transfer of Care Note  Patient: Pamela Malone  Procedure(s) Performed: LAPAROSCOPIC VS OPEN RIGHT HEMI COLECTOMY (Right )  Patient Location: PACU  Anesthesia Type:General  Level of Consciousness: awake, alert  and oriented  Airway & Oxygen Therapy: Patient Spontanous Breathing and Patient connected to face mask oxygen  Post-op Assessment: Report given to RN and Post -op Vital signs reviewed and stable  Post vital signs: Reviewed and stable  Last Vitals:  Vitals Value Taken Time  BP    Temp    Pulse    Resp    SpO2      Last Pain:  Vitals:   03/30/18 0733  PainSc: 0-No pain         Complications: No apparent anesthesia complications

## 2018-03-30 NOTE — Anesthesia Procedure Notes (Signed)
Procedure Name: Intubation Date/Time: 03/30/2018 8:37 AM Performed by: Florene Routeeardon, Aniyah Nobis L, CRNA Patient Re-evaluated:Patient Re-evaluated prior to induction Oxygen Delivery Method: Circle system utilized Preoxygenation: Pre-oxygenation with 100% oxygen Induction Type: IV induction Ventilation: Mask ventilation without difficulty and Oral airway inserted - appropriate to patient size Laryngoscope Size: Hyacinth MeekerMiller and 2 Grade View: Grade I Tube type: Oral Tube size: 7.5 mm Number of attempts: 1 Airway Equipment and Method: Stylet Placement Confirmation: ETT inserted through vocal cords under direct vision,  positive ETCO2 and breath sounds checked- equal and bilateral Secured at: 21 cm Tube secured with: Tape Dental Injury: Teeth and Oropharynx as per pre-operative assessment

## 2018-03-30 NOTE — H&P (Signed)
CC: Here today for surgery  HPI: [name] is a very pleasant 67yoF referred to me by Dr. Marca AnconaKarki for evaluation of a ulcerated mass like lesion of the ascending colon which was tattooed distally. She developed acute onset right-sided abdominal discomfort 12/2017 and underwent evaluation. She reports not having had fevers but did have a Stephanye Finnicum blood cell count of 26. CT abdomen and pelvis demonstrated an inflamed cecum and edematous pericecal fat but normal appearing appendix. Considered acute infectious cecal colitis or typhlitis but possibly a neoplasm. The distal small bowel appeared normal. There is no free fluid, lymphadenopathy or other complicated features. Incidentally noted gallstones  Colonoscopy by Dr. Marca AnconaKarki 01/27/18 demonstrated perianal skin tags, hemorrhoids, localized area of severely inflamed, ulcerated and thickened folds of mucosa in the ascending colon opposite of the ileocecal valve-question malignant ulcerated mass. The area appeared erythematous, ulcerated, friable. Biopsies were taken in the area around this area was injected with tattoo. Pathology returned benign ulcerated colonic mucosa, negative for dysplasia and malignancy.  She saw Dr. Marca AnconaKarki in follow-up he subsequently referred her here for consideration of surgery to rule out this being a malignant process; differential including lymphoma, infectious colitis, possibly Crohn's, focal ischemic colitis although atypical location, colon adenocarcinoma  She has been doing well and denies any complaints. She denies any changes in her health or history since we saw her in clinic. Tolerated her bowel prep.   PMH: Psoriatic arthritis for which she takes a low-dose methotrexate for - 7.5 mg by mouth per week; hyperlipidemia (well-controlled on simvastatin), GERD (is well controlled on PPI)  PSH: Left knee and left foot surgery; left thumb surgery. Denies any prior abdominal surgeries  FHx: Denies FHx of  malignancy  Social: Denies use of tobacco/drugs; drinks 1-2 glasses of wine most nights  ROS: A comprehensive 10 system review of systems was completed with the patient and pertinent findings as noted above.       Past Medical History:  Diagnosis Date  . Cataract    immature  . Depression    many years ago  . DJD (degenerative joint disease)   . GERD (gastroesophageal reflux disease)   . History of right bundle branch block (RBBB)    released after cardiac evaluation by Dr Donnie Ahoilley 12/2014; gave surgical clearance  . Hyperlipidemia   . Hypertension   . Low back pain   . Menopause   . Nocturia   . Psoriasis   . Scoliosis          Past Surgical History:  Procedure Laterality Date  . ANKLE SURGERY Right    from a previous injury as a child   . CARPAL TUNNEL RELEASE  bilateral  . DILATION AND CURETTAGE OF UTERUS  2009  . Left Thumb Joint Replacement    . TOTAL KNEE ARTHROPLASTY Left 01/28/2015   Procedure: TOTAL KNEE ARTHROPLASTY;  Surgeon: Dannielle HuhSteve Lucey, MD;  Location: [facility] OR;  Service: Orthopedics;  Laterality: Left;         Family History  Problem Relation Age of Onset  . Hypertension Mother   . Stroke Mother   . Alzheimer's disease Mother   . Diabetes Paternal Grandmother   . Parkinsonism Paternal Grandmother   . Breast cancer Neg Hx     Social:  reports that she has never smoked. She has never used smokeless tobacco. She reports that she drinks alcohol. She reports that she does not use drugs.  Allergies: No Known Allergies  Medications: I have reviewed the patient's current  medications.  LabResultsLast48Hours  No results found for this or any previous visit (from the past 48 hour(s)).    ImagingResults(Last48hours)  No results found.    ROS - all of the below systems have been reviewed with the patient and positives are indicated with bold text General: chills, fever or night sweats Eyes: blurry vision or  double vision ENT: epistaxis or sore throat Allergy/Immunology: itchy/watery eyes or nasal congestion Hematologic/Lymphatic: bleeding problems, blood clots or swollen lymph nodes Endocrine: temperature intolerance or unexpected weight changes Breast: new or changing breast lumps or nipple discharge Resp: cough, shortness of breath, or wheezing CV: chest pain or dyspnea on exertion GI: as per HPI GU: dysuria, trouble voiding, or hematuria MSK: joint pain or joint stiffness Neuro: TIA or stroke symptoms Derm: pruritus and skin lesion changes Psych: anxiety and depression  PE Blood pressure (!) 151/101, pulse 92, temperature 98.3 F (36.8 C), resp. rate 20, height 5\' 1"  (1.549 m), weight 82.6 kg (182 lb), SpO2 99 %. Constitutional: NAD; conversant; no deformities Eyes: Moist conjunctiva; no lid lag; anicteric; PERRL Neck: Trachea midline; no thyromegaly Lungs: Normal respiratory effort; no tactile fremitus CV: RRR; no palpable thrills; no pitting edema GI: Abd soft, NT/ND; no palpable hepatosplenomegaly MSK: Normal gait; no clubbing/cyanosis Psychiatric: Appropriate affect; alert and oriented x3 Lymphatic: No palpable cervical or axillary lymphadenopathy   A/P: [name] is a very pleasant 67yoF hx of psoriatic arthritis, HLD, GERD - here today for surgery for an ulcerated mass of the ascending colon - biopsies thus far have been benign  -I had a long discussion in clinic with the patient and her husband and then again today regarding all of the above. We discussed the fact that at this time, biopsies have not revealed this to be a malignancy. Differential diagnosis remains quite wide at this time including lymphoma, infectious colitis, Crohn's, focal ischemic colitis although atypical location, colon adenocarcinoma, other malignancy. -She opted to proceed with surgery after discussion of the above in clinic. We will plan laparoscopic vs open right hemicolectomy today. -The  anatomy and physiology of the GI tract was discussed at length with the patient. The pathophysiology of all of the above including malignancy and polyps was discussed at length with associated pictures. -The planned procedure, material risks (including, but not limited to, pain, bleeding, infection, scarring, need for blood transfusion, damage to surrounding structures- blood vessels/nerves/viscus/organs, damage to ureter, urine leak, leak from anastomosis, need for additional procedures, need for stoma which may be permanent, hernia, worsening of pre-existing medical conditions, recurrence - particularly if this was cancer, pneumonia, heart attack, stroke, death) benefits and alternatives to surgery were discussed at length. The patient's questions were answered to her satisfaction, she voiced understanding and elected to proceed with surgery. Additionally, we discussed typical postoperative expectations and the recovery process.  Pamela Malone. Pamela Malone, M.D. General and Colorectal Surgery [facility]

## 2018-03-30 NOTE — Anesthesia Postprocedure Evaluation (Signed)
Anesthesia Post Note  Patient: Pamela Malone  Procedure(s) Performed: LAPAROSCOPIC VS OPEN RIGHT HEMI COLECTOMY (Right )     Patient location during evaluation: PACU Anesthesia Type: General Level of consciousness: awake and alert and oriented Pain management: pain level controlled Vital Signs Assessment: post-procedure vital signs reviewed and stable Respiratory status: spontaneous breathing, nonlabored ventilation and respiratory function stable Cardiovascular status: blood pressure returned to baseline and stable Postop Assessment: no apparent nausea or vomiting Anesthetic complications: no    Last Vitals:  Vitals:   03/30/18 1205 03/30/18 1216  BP:  123/74  Pulse: 67   Resp: 15 14  Temp:  36.9 C  SpO2: 97% 98%    Last Pain:  Vitals:   03/30/18 1205  PainSc: 2                  Recardo Linn A.

## 2018-03-31 ENCOUNTER — Encounter (HOSPITAL_COMMUNITY): Payer: Self-pay | Admitting: Surgery

## 2018-03-31 LAB — CBC
HCT: 34.4 % — ABNORMAL LOW (ref 36.0–46.0)
Hemoglobin: 11.9 g/dL — ABNORMAL LOW (ref 12.0–15.0)
MCH: 32.3 pg (ref 26.0–34.0)
MCHC: 34.6 g/dL (ref 30.0–36.0)
MCV: 93.5 fL (ref 78.0–100.0)
PLATELETS: 294 10*3/uL (ref 150–400)
RBC: 3.68 MIL/uL — AB (ref 3.87–5.11)
RDW: 12.7 % (ref 11.5–15.5)
WBC: 13.1 10*3/uL — ABNORMAL HIGH (ref 4.0–10.5)

## 2018-03-31 LAB — BASIC METABOLIC PANEL
ANION GAP: 10 (ref 5–15)
BUN: 9 mg/dL (ref 8–23)
CALCIUM: 9.1 mg/dL (ref 8.9–10.3)
CO2: 25 mmol/L (ref 22–32)
CREATININE: 0.66 mg/dL (ref 0.44–1.00)
Chloride: 89 mmol/L — ABNORMAL LOW (ref 98–111)
Glucose, Bld: 122 mg/dL — ABNORMAL HIGH (ref 70–99)
Potassium: 4.3 mmol/L (ref 3.5–5.1)
Sodium: 124 mmol/L — ABNORMAL LOW (ref 135–145)

## 2018-03-31 LAB — MAGNESIUM: MAGNESIUM: 1.6 mg/dL — AB (ref 1.7–2.4)

## 2018-03-31 LAB — PHOSPHORUS: PHOSPHORUS: 3.2 mg/dL (ref 2.5–4.6)

## 2018-03-31 MED ORDER — TRAMADOL HCL 50 MG PO TABS: 50.0000 mg | ORAL_TABLET | Freq: Four times a day (QID) | ORAL | 0 refills | Status: AC | PRN

## 2018-03-31 NOTE — Discharge Instructions (Signed)
POST OP INSTRUCTIONS  1. DIET: Avoid raw fruits/vegetables for the first 4 weeks after surgery, otherwise, diet as tolerated.  2. Take your usually prescribed home medications unless otherwise directed.  3. PAIN CONTROL: a. Pain is best controlled by a usual combination of three different methods TOGETHER: i. Ice/Heat ii. Over the counter pain medication iii. Prescription pain medication b. Most patients will experience some swelling and bruising around the surgical site.  Ice packs or heating pads (30-60 minutes up to 6 times a day) will help. Some people prefer to use ice alone, heat alone, alternating between ice & heat.  Experiment to what works for you.  Swelling and bruising can take several weeks to resolve.   c. It is helpful to take an over-the-counter pain medication regularly for the first few weeks: i. Ibuprofen (Motrin/Advil) - 200mg  tabs - take 3 tabs (600mg ) every 6 hours as needed for pain ii. Acetaminophen (Tylenol) - you may take 650mg  every 6 hours as needed. You can take this with motrin as they act differently on the body. If you are taking a narcotic pain medication that has acetaminophen in it, do not take over the counter tylenol at the same time.  Iii. NOTE: You may take both of these medications together - most patients  find it most helpful when alternating between the two (i.e. Ibuprofen at 6am,  tylenol at 9am, ibuprofen at 12pm ...) d. A  prescription for pain medication should be given to you upon discharge.  Take your pain medication as prescribed if your pain is not adequatly controlled with the over-the-counter pain reliefs mentioned above.  4. Avoid getting constipated.  Between the surgery and the pain medications, it is common to experience some constipation.  Increasing fluid intake and taking a fiber supplement (such as Metamucil, Citrucel, FiberCon, MiraLax, etc) 1-2 times a day regularly will usually help prevent this problem from occurring.  A mild laxative  (prune juice, Milk of Magnesia, MiraLax, etc) should be taken according to package directions if there are no bowel movements after 48 hours.    5. Dressing: Your incisions are covered in Dermabond which is like sterile superglue for the skin. The midline incision is not covered in dermabond so keep this clean/dry for the first 3 days after surgery; after this, it's ok to bathe normally in a shower with soap/water over all wounds. The dermabond will come off on it's own in a couple weeks. It is waterproof. That said, avoid baths/pools/lakes/oceans until your wounds have fully healed.  6. ACTIVITIES as tolerated:   a. Avoid heavy lifting (>10lbs or 1 gallon of milk) for the next 6 weeks. b. You may resume regular (light) daily activities--such as daily self-care, walking, climbing stairs--gradually increasing activities as tolerated.  If you can walk 30 minutes without difficulty, it is safe to try more intense activity such as jogging, treadmill, bicycling, low-impact aerobics.  c. DO NOT PUSH THROUGH PAIN.  Let pain be your guide: If it hurts to do something, don't do it. d. Bonita QuinYou may drive when you are no longer taking prescription pain medication, you can comfortably wear a seatbelt, and you can safely maneuver your car and apply brakes.  7. FOLLOW UP in our office a. Please call [facility] at [phone] to set up an appointment to see your surgeon in the office for a follow-up appointment approximately 2 weeks after your surgery. b. Make sure that you call for this appointment the day you arrive home to insure  a convenient appointment time.  9. If you have disability or family leave forms that need to be completed, you may have them completed by your primary care physician's office; for return to work instructions, please ask our office staff and they will be happy to assist you in obtaining this documentation   When to call us [phone]: 1. Poor pain control 2. Reactions / problems with new  medications (rash/itching, etc)  3. Fever over 101.5 F (38.5 C) 4. Inability to urinate 5. Nausea/vomiting 6. Worsening swelling or bruising 7. Continued bleeding from incision. 8. Increased pain, redness, or drainage from the incision  The clinic staff is available to answer your questions during regular business hours (8:30am-5pm).  Please dont hesitate to call and ask to speak to one of our nurses for clinical concerns.   A surgeon from [facility] is always on call at the hospitals   If you have a medical emergency, go to the nearest emergency room or call 911.  [facility] [address], [address], [place], [place]  [address] MAIN: [phone] FAX: [phone] [email or web address]

## 2018-03-31 NOTE — Progress Notes (Signed)
Subjective No acute events. Feeling great. Asking about when she can go home. Tolerating full liquids without nausea/vomiting. Ambulating 5x/day. Had small bm and passing gas.  Objective: Vital signs in last 24 hours: Temp:  [97.5 F (36.4 C)-98.4 F (36.9 C)] 98.3 F (36.8 C) (07/04 0535) Pulse Rate:  [66-83] 77 (07/04 0535) Resp:  [13-20] 20 (07/04 0535) BP: (110-155)/(66-86) 125/76 (07/04 0535) SpO2:  [96 %-100 %] 97 % (07/04 0535) Last BM Date: 03/29/18  Intake/Output from previous day: 07/03 0701 - 07/04 0700 In: 2480 [P.O.:720; I.V.:1660; IV Piggyback:100] Out: 1876 [Urine:1825; Stool:1; Blood:50] Intake/Output this shift: No intake/output data recorded.  Gen: NAD, comfortable CV: RRR Pulm: Normal work of breathing Abd: Soft, NT/ND; incisions c/d/i without erythema. Honeycomb dressing in place Ext: SCDs in place  Lab Results: CBC  Recent Labs    03/31/18 0533  WBC 13.1*  HGB 11.9*  HCT 34.4*  PLT 294   BMET Recent Labs    03/31/18 0533  NA 124*  K 4.3  CL 89*  CO2 25  GLUCOSE 122*  BUN 9  CREATININE 0.66  CALCIUM 9.1   PT/INR No results for input(s): LABPROT, INR in the last 72 hours. ABG No results for input(s): PHART, HCO3 in the last 72 hours.  Invalid input(s): PCO2, PO2  Studies/Results:  Anti-infectives: Anti-infectives (From admission, onward)   Start     Dose/Rate Route Frequency Ordered Stop   03/30/18 1400  neomycin (MYCIFRADIN) tablet 1,000 mg  Status:  Discontinued     1,000 mg Oral 3 times per day 03/30/18 0710 03/30/18 0713   03/30/18 1400  metroNIDAZOLE (FLAGYL) tablet 1,000 mg  Status:  Discontinued     1,000 mg Oral 3 times per day 03/30/18 0710 03/30/18 0713   03/30/18 0715  cefoTEtan (CEFOTAN) 2 g in sodium chloride 0.9 % 100 mL IVPB     2 g 200 mL/hr over 30 Minutes Intravenous On call to O.R. 03/30/18 0710 03/30/18 0855       Assessment/Plan: Patient Active Problem List   Diagnosis Date Noted  . Colonic mass  03/30/2018  . 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 Arthritis of knee, degenerative 01/25/2015  . History of hyperglycemia 02/20/2013  . 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 Shingles 02/23/2012  . 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 DJD (degenerative joint disease)   . Scoliosis   . Low back pain   . Depression   . GERD (gastroesophageal reflux disease)   . Hyperlipidemia   . Hypertension   . Menopause   . Psoriasis    s/p Procedure(s): LAPAROSCOPIC RIGHT HEMI COLECTOMY 03/30/2018 POD#1  -Advance to GI soft diet -D/C IVF -Ambulate 5x/day -IS 10x/hr while awake -PPx: SQH, SCDs, PPI -Dispo: Possible discharge home tomorrow   LOS: 1 day   Stephanie CoupChristopher M. Cliffton AstersWhite, M.D. General and Colorectal Surgery [facility]

## 2018-04-01 LAB — BASIC METABOLIC PANEL
Anion gap: 6 (ref 5–15)
BUN: 13 mg/dL (ref 8–23)
CHLORIDE: 96 mmol/L — AB (ref 98–111)
CO2: 32 mmol/L (ref 22–32)
Calcium: 9.4 mg/dL (ref 8.9–10.3)
Creatinine, Ser: 1.01 mg/dL — ABNORMAL HIGH (ref 0.44–1.00)
GFR calc non Af Amer: 57 mL/min — ABNORMAL LOW (ref >60)
Glucose, Bld: 94 mg/dL (ref 70–99)
POTASSIUM: 4.6 mmol/L (ref 3.5–5.1)
SODIUM: 134 mmol/L — AB (ref 135–145)

## 2018-04-01 LAB — CBC
HCT: 36.6 % (ref 36.0–46.0)
Hemoglobin: 12.8 g/dL (ref 12.0–15.0)
MCH: 32.9 pg (ref 26.0–34.0)
MCHC: 35 g/dL (ref 30.0–36.0)
MCV: 94.1 fL (ref 78.0–100.0)
Platelets: 306 10*3/uL (ref 150–400)
RBC: 3.89 MIL/uL (ref 3.87–5.11)
RDW: 13.2 % (ref 11.5–15.5)
WBC: 12.5 10*3/uL — AB (ref 4.0–10.5)

## 2018-04-01 LAB — MAGNESIUM: Magnesium: 2 mg/dL (ref 1.7–2.4)

## 2018-04-01 LAB — PHOSPHORUS: Phosphorus: 3 mg/dL (ref 2.5–4.6)

## 2018-04-01 NOTE — Progress Notes (Signed)
Pt alert, oriented, tolerating diet.  D/C instructions given, all questions answered.  Pt d/cd home. 

## 2018-04-01 NOTE — Discharge Summary (Signed)
Patient ID: Pamela Malone MRN: [id number] DOB/AGE: Nov 09, 1950 67 y.o.  Admit date: 03/30/2018 Discharge date: 04/01/2018  Discharge Diagnoses Patient Active Problem List   Diagnosis Date Noted  . 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 Arthritis of knee, degenerative 01/25/2015  . History of hyperglycemia 02/20/2013  . 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 Shingles 02/23/2012  . 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 DJD (degenerative joint disease)   . Scoliosis   . Low back pain   . Depression   . GERD (gastroesophageal reflux disease)   . Hyperlipidemia   . Hypertension   . Menopause   . Psoriasis    Procedures Laparoscopic right hemicolectomy with stapled ileocolic anastomosis (03/30/18)  Hospital Course: She was admitted postoperatively and did well. On POD#1, she began having bowel function and was advanced to a soft diet. She tolerated this well without n/v. Her pain was controlled on tylenol. She continued to have BMs and do well. She was deemed stable for discharge home.  NAD, comfortable RRR Abdomen soft, NT/ND; incisions c/d/i without erythema; ecchymoses around supraumbilical incision. Ext: No pitting edema   Allergies as of 04/01/2018   No Known Allergies     Medication List    STOP taking these medications   ciprofloxacin 750 MG tablet Commonly known as:  CIPRO   HYDROcodone-acetaminophen 5-325 MG tablet Commonly known as:  NORCO/VICODIN   metroNIDAZOLE 500 MG tablet Commonly known as:  FLAGYL     TAKE these medications   acetaminophen 500 MG tablet Commonly known as:  TYLENOL Take 1,000 mg by mouth daily as needed for moderate pain or headache.   aspirin EC 81 MG tablet Take 81 mg by mouth every evening.   BIOTIN PO Take 1 tablet by mouth daily.   cetirizine 10 MG tablet Commonly known as:  ZYRTEC Take 10 mg by mouth daily as needed for allergies.   citalopram 20 MG  tablet Commonly known as:  CELEXA Take 20 mg by mouth daily.   clobetasol ointment 0.05 % Commonly known as:  TEMOVATE Apply topically once daily as needed for psoriasis   folic acid 1 MG tablet Commonly known as:  FOLVITE Take 1 mg by mouth daily.   ICAPS AREDS 2 PO Take 1 tablet by mouth every evening.   meloxicam 15 MG tablet Commonly known as:  MOBIC Take 15 mg by mouth daily.   methotrexate 2.5 MG tablet Commonly known as:  RHEUMATREX Take 7.5 mg by mouth once weekly on Thursdays   multivitamin with minerals Tabs tablet Take 1 tablet by mouth every evening.   Olmesartan-amLODIPine-HCTZ 40-5-25 MG Tabs Commonly known as:  TRIBENZOR Take 1 tablet by mouth daily.   omeprazole 20 MG capsule Commonly known as:  PRILOSEC TAKE 1 CAPSULE DAILY   ondansetron 4 MG disintegrating tablet Commonly known as:  ZOFRAN ODT Take 1 tablet (4 mg total) by mouth every 8 (eight) hours as needed for nausea or vomiting.   PROTOPIC 0.1 % ointment Generic drug:  tacrolimus Apply 1 application topically daily as needed (psoriasis).   simvastatin 20 MG tablet Commonly known as:  ZOCOR TAKE 1 TABLET AT BEDTIME   sodium chloride 0.65 % Soln nasal spray Commonly known as:  OCEAN Place 1 spray into both nostrils as needed for congestion.   SYSTANE OP Place 1 drop into both eyes 2 (two) times daily.   traMADol 50 MG tablet Commonly known as:  Janean Sark  Take 1 tablet (50 mg total) by mouth every 6 (six) hours as needed for up to 7 days (postop pain not controlled with ibuprofen and tylenol).   Vitamin D 2000 units tablet Take 4,000 Units by mouth daily.      Stephanie Couphristopher M. Cliffton AstersWhite, M.D. [facility]

## 2018-04-04 ENCOUNTER — Encounter: Payer: Self-pay | Admitting: Podiatry

## 2018-04-04 NOTE — Progress Notes (Signed)
Disc with patients x-rays was placed up front to be mailed out to [facility] today, Monday 04 April 2018 per patients request.

## 2018-06-09 IMAGING — CT CT ABD-PELV W/ CM
2 of 5 series · 16 of 46 positions shown, 18 images · IV contrast (ISOVUE)
Comparison: Upper GI series 02/05/2009.

CLINICAL DATA: 67-year-old female with right lower quadrant
abdominal pain since 1222 hours yesterday. Appendicitis suspected.

EXAM:
CT ABDOMEN AND PELVIS WITH CONTRAST
TECHNIQUE: Multidetector CT imaging of the abdomen and pelvis was performed
using the standard protocol following bolus administration of
intravenous contrast.
CONTRAST:  100mL 3W7PYL-D99 IOPAMIDOL (3W7PYL-D99) INJECTION 61%

[Series 2: axial st · axial · 0.70mm/px · z∈[+1171,+1536]mm · 13 of 85 slices shown, 15 images]
[im 6/85  soft-tissue]
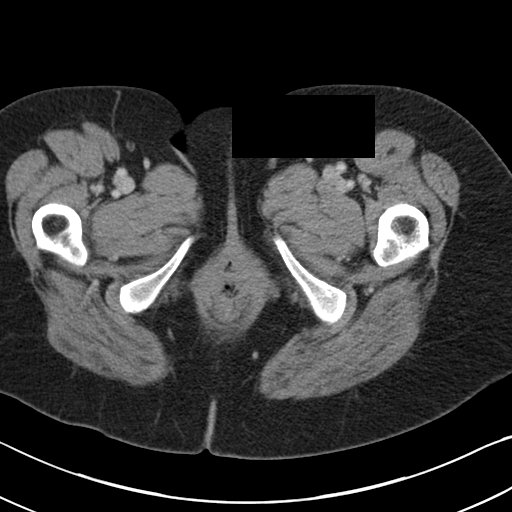
[im 6/85  bone]
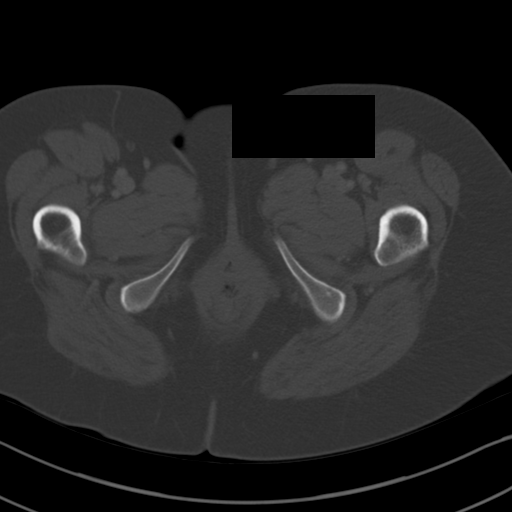
[im 12/85  soft-tissue]
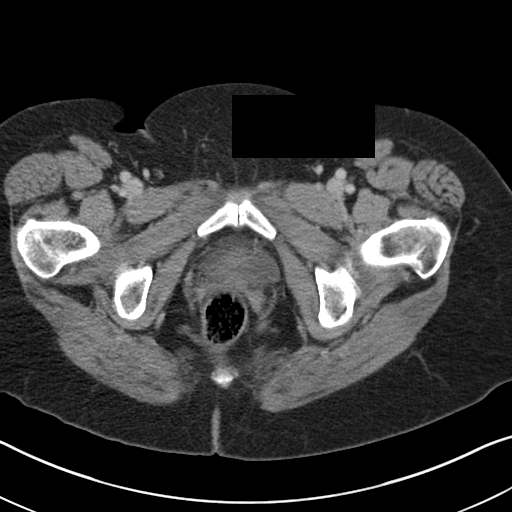
[im 17/85  soft-tissue]
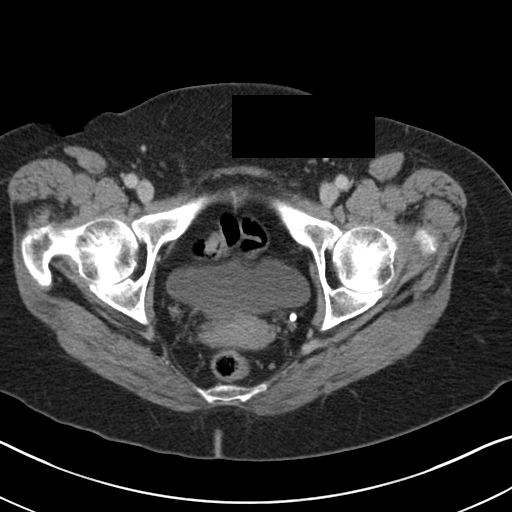
[im 23/85  soft-tissue]
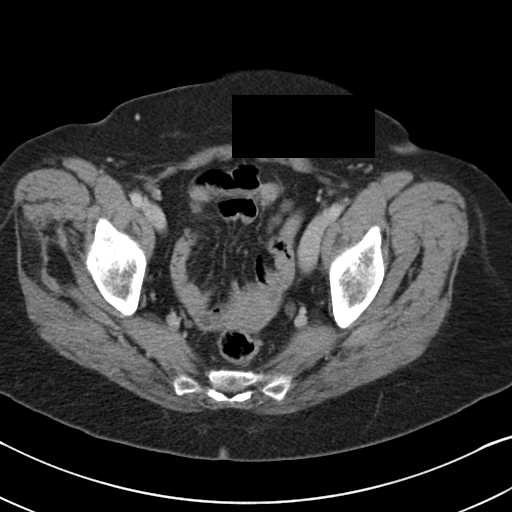
[im 29/85  soft-tissue]
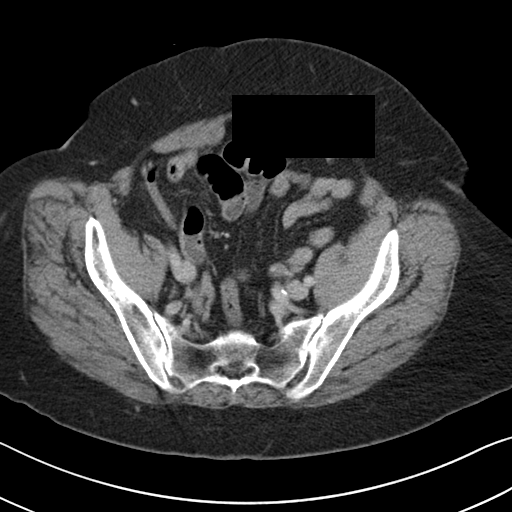
[im 34/85  soft-tissue]
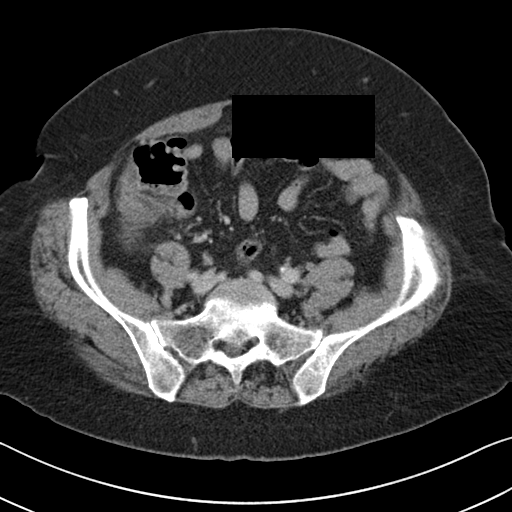
[im 45/85  soft-tissue]
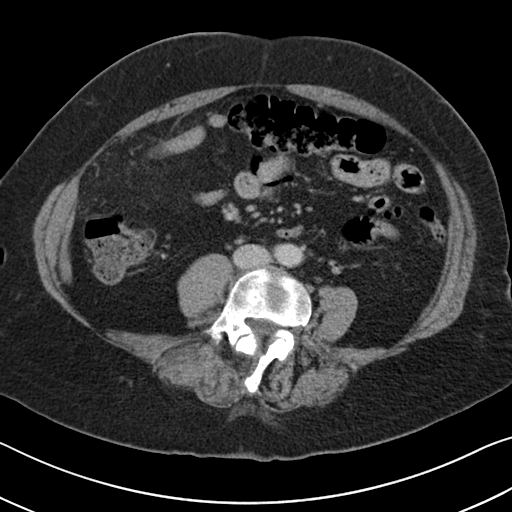
[im 51/85  soft-tissue]
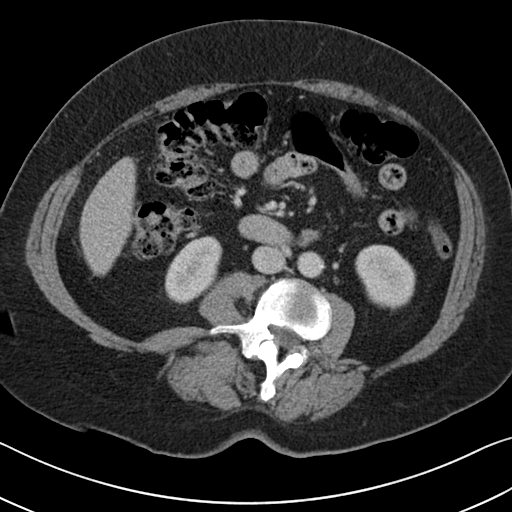
[im 57/85  soft-tissue]
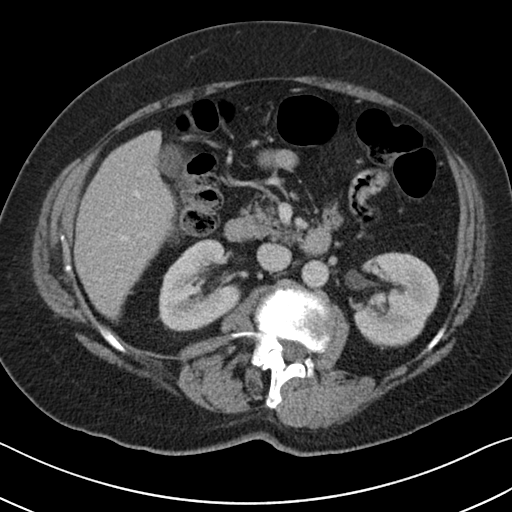
[im 57/85  bone]
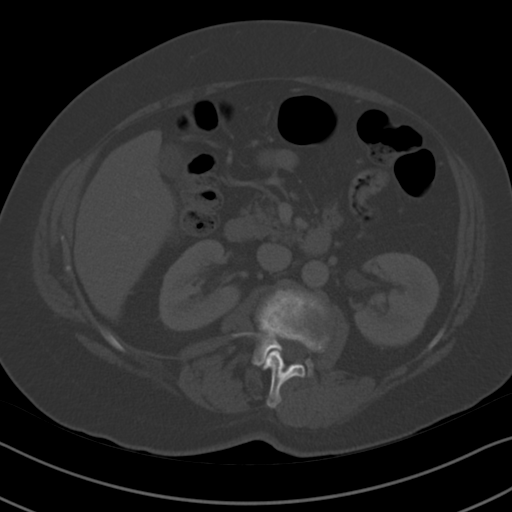
[im 62/85  soft-tissue]
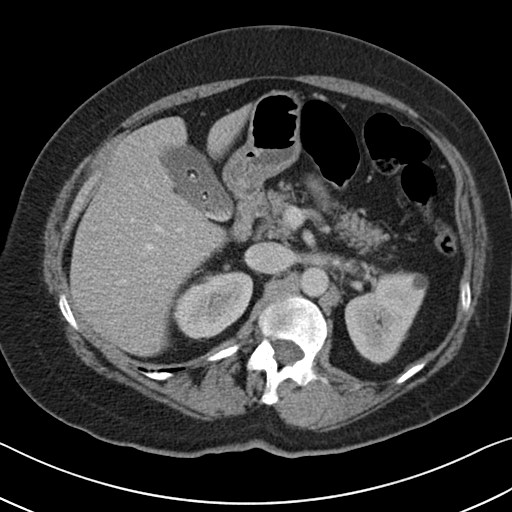
[im 68/85  soft-tissue]
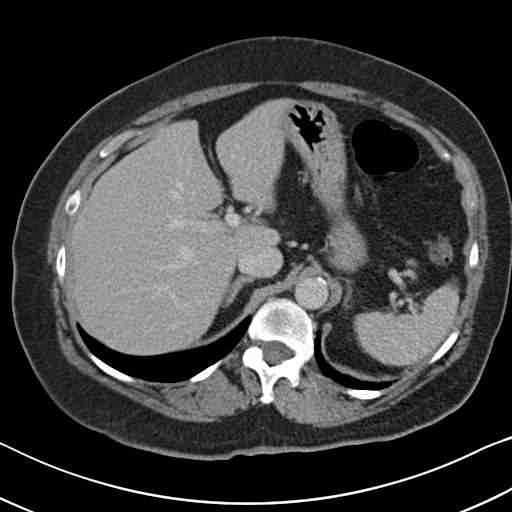
[im 73/85  soft-tissue]
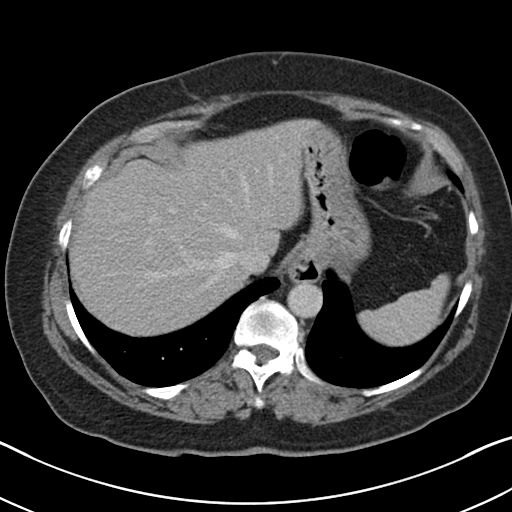
[im 79/85  soft-tissue]
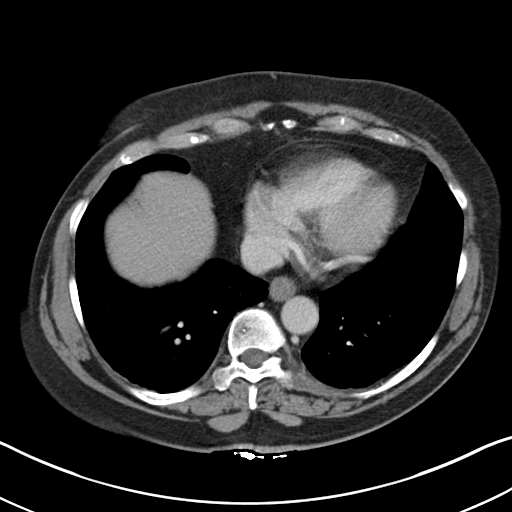

[Series 6: coronal st · coronal · 0.80mm/px · 3 of 103 slices shown]
[im 35/103  soft-tissue]
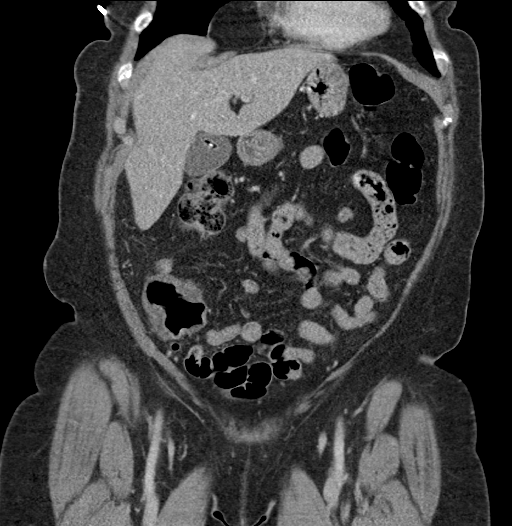
[im 46/103  soft-tissue]
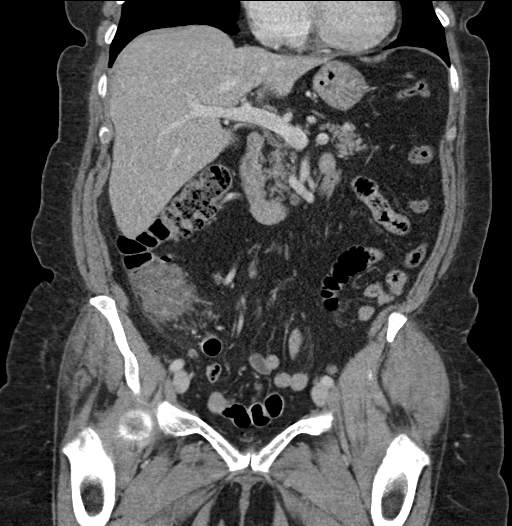
[im 57/103  soft-tissue]
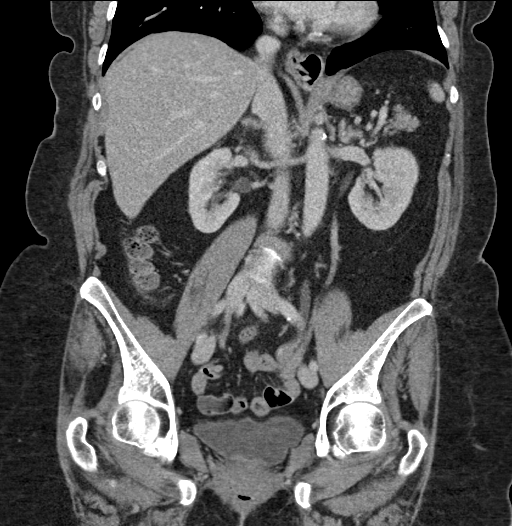

[16 of 46 positions shown; findings below may reference images not displayed]

FINDINGS: Lower chest: Negative lung bases. No pleural effusion. Mild
cardiomegaly. No pericardial effusion.

Hepatobiliary: Cholelithiasis. No pericholecystic inflammation.
Small probable benign cyst in the inferior right hepatic lobe on
series 2, image 32. Otherwise negative liver.

Pancreas: Negative.

Spleen: Negative.

Adrenals/Urinary Tract: Normal adrenal glands.

Bilateral renal enhancement and contrast excretion is symmetric and
within normal limits. There is a small anterior left renal midpole
benign cysts suspected. Negative course of the right ureter.
Negative left ureter.

Several pelvic phleboliths, more so on the left. Diminutive and
unremarkable urinary bladder.

Stomach/Bowel: Negative rectum. Redundant sigmoid colon which tracks
into the epigastrium. Diverticulosis of the proximal sigmoid and
distal descending colon without active inflammation. Redundant
splenic flexure. Negative transverse colon. Negative ascending
colon.

There is inflammation about the cecum, but the appendix appears
unaffected and normal (series 2, image 57 and coronal images 36
through 49. The cecal wall is indistinct and there is mild pericecal
inflammation mostly posteriorly and laterally near the level of the
ileocecal valve. However, the terminal ileum does not appear
inflamed. There are no large bowel diverticula in the region.

The other distal small bowel loops are also within normal limits. No
dilated small bowel. Small gastric hiatal hernia. Otherwise negative
stomach and duodenum.

No abdominal free air.  No free fluid.

Vascular/Lymphatic: Mild Calcified aortic atherosclerosis. The major
arterial structures in the abdomen and pelvis are patent. The portal
venous system is patent.

No lymphadenopathy. No abnormal mesenteric lymph nodes in the right
lower quadrant identified.

Reproductive: Negative.

Other: No pelvic free fluid.

Musculoskeletal: Moderate levoconvex scoliosis. Intermittent
advanced lumbar disc, endplate, and posterior element degeneration.
No acute osseous abnormality identified.
IMPRESSION: 1. Inflamed cecum and edematous pericecal fat, but the appendix
remains normal. Consider acute infectious cecal colitis or
typhlitis, however, follow-up is recommended to exclude a cecal
neoplasm which can have a similar appearance and presentation.
2. The distal small bowel remains normal. There is no free fluid,
lymphadenopathy, or complicating features.
3. Cholelithiasis.

## 2019-03-21 ENCOUNTER — Other Ambulatory Visit: Payer: Self-pay | Admitting: Family Medicine

## 2019-03-21 DIAGNOSIS — Z1231 Encounter for screening mammogram for malignant neoplasm of breast: Secondary | ICD-10-CM

## 2019-04-13 ENCOUNTER — Other Ambulatory Visit: Payer: Self-pay | Admitting: Surgery

## 2019-04-13 DIAGNOSIS — R109 Unspecified abdominal pain: Secondary | ICD-10-CM

## 2019-04-19 ENCOUNTER — Other Ambulatory Visit: Payer: 59

## 2019-05-02 ENCOUNTER — Ambulatory Visit: Payer: 59

## 2019-05-24 ENCOUNTER — Other Ambulatory Visit: Payer: Self-pay

## 2019-05-24 ENCOUNTER — Ambulatory Visit
Admission: RE | Admit: 2019-05-24 | Discharge: 2019-05-24 | Disposition: A | Discharge status: Home / Self Care | Payer: 59 | Source: Ambulatory Visit | Attending: Family Medicine | Admitting: Family Medicine

## 2019-05-24 DIAGNOSIS — Z1231 Encounter for screening mammogram for malignant neoplasm of breast: Secondary | ICD-10-CM

## 2019-05-25 ENCOUNTER — Other Ambulatory Visit: Payer: Self-pay

## 2019-05-25 ENCOUNTER — Ambulatory Visit
Admission: RE | Admit: 2019-05-25 | Discharge: 2019-05-25 | Disposition: A | Discharge status: Home / Self Care | Payer: 59 | Source: Ambulatory Visit | Attending: Surgery | Admitting: Surgery

## 2019-05-25 DIAGNOSIS — R109 Unspecified abdominal pain: Secondary | ICD-10-CM

## 2019-05-25 MED ORDER — IOPAMIDOL (ISOVUE-300) INJECTION 61%
100.0000 mL | Freq: Once | INTRAVENOUS | Status: AC | PRN
  Administered 2019-05-25: 100 mL via INTRAVENOUS

## 2019-06-19 ENCOUNTER — Ambulatory Visit: Payer: Self-pay | Admitting: Surgery

## 2019-06-19 NOTE — H&P (Signed)
Garnette Scheuermann Documented: 06/19/2019 8:48 AM Location: [facility] Patient #: [id number] DOB: 1951/02/17 Married / Language: Pamela Malone / Race: White Female  History of Present Illness Pamela Hector MD; 06/19/2019 1:18 PM) The patient is a 68 year old female who presents with an incisional hernia. Note for "Incisional hernia": ` ` ` Patient sent for surgical consultation at the request of Dr Dema Severin  Chief Complaint: Incisional hernia with pain and discomfort. ` ` The patient is a pleasant obese woman. Found to have lower abdominal pain and had CAT scans concerning for inflammation. Underwent endoscopic workup concerning for an ulcerative mass in the ascending colon. Underwent laparoscopic proximal right colectomy with primary umbilical extraction incision in July 2019. She recovered from that well. She's pain and discomfort and swelling in the region. Follow-up with Dr. Dema Severin. Concern for incisional hernia. CAT scan showing 2 moderate size. Umbilical defects containing at least transferred, possibly small bowel as well. Referred to me to consider hernia repairs.  Patient comes today by herself. She normally walks 30 minutes a day. He does have history of psoriasis. Was on methotrexate for quite some time. She has been transitioned to topical medications. Followed by Dr. Tonia Brooms dermatology. Moves her bowels every day. She's had episodes of worsening pain when she sneezes or gets up or moves. She sometimes gets episodes of abdominal pain when she moves in bed at night. She had a couple episodes of nausea and vomiting from coffee. She does have a small but definite hiatal hernia. She has some reflux t but that is usually well controlled with omeprazole. No major changes since. No dysphagia to solids or liquids. No worsening reflux bending over nor lying supine. She is not diabetic. Because of her persistent abdominal pain concerns, she is interested in seeing if  something more definitive to be done  (Review of systems as stated in this history (HPI) or in the review of systems. Otherwise all other 12 point ROS are negative) ` ` `   Allergies (Pamela Malone, CMA; 06/19/2019 8:48 AM) No Known Drug Allergies [02/09/2018]: Allergies Reconciled  Medication History Pamela Malone, CMA; 06/19/2019 8:49 AM) Simvastatin (20MG  Tablet, Oral) Active. Omeprazole (20MG  Capsule DR, Oral) Active. Tribenzor (40-5-12.5MG  Tablet, Oral) Active. CeleXA (20MG  Tablet, Oral) Active. Mobic (15MG  Tablet, Oral) Active. Protopic (0.1% Ointment, External) Active. Clobetasol Propionate (0.05% Gel, External) Active. ZyrTEC Allergy (10MG  Tablet, Oral) Active. Centrum Silver (Oral) Active. Vitamin D (2000UNIT Tablet, Oral) Active. Biotin (5000MCG Tablet, Oral) Active. [employer] Gentle Laxative (5MG  Tablet DR, Oral) Active. GaviLyte-N with Flavor Pack (420GM For Solution, Oral) Active. Medications Reconciled    Vitals (Pamela Malone CMA; 06/19/2019 8:50 AM) 06/19/2019 8:49 AM Weight: 185.38 lb Height: 61in Body Surface Area: 1.83 m Body Mass Index: 35.03 kg/m  Temp.: 96.19F(Temporal)  Pulse: 98 (Regular)  BP: 122/68 (Sitting, Left Arm, Standard)        Physical Exam Pamela Hector MD; 06/19/2019 1:18 PM)  General Mental Status-Alert. General Appearance-Not in acute distress, Not Sickly. Orientation-Oriented X3. Hydration-Well hydrated. Voice-Normal. Note: Pleasant. Mildly anxious. Respectful.  Integumentary Global Assessment Upon inspection and palpation of skin surfaces of the - Axillae: non-tender, no inflammation or ulceration, no drainage. and Distribution of scalp and body hair is normal. General Characteristics Temperature - normal warmth is noted.  Head and Neck Head-normocephalic, atraumatic with no lesions or palpable masses. Face Global Assessment - atraumatic, no absence of expression. Neck Global  Assessment - no abnormal movements, no bruit auscultated on the right, no bruit  auscultated on the left, no decreased range of motion, non-tender. Trachea-midline. Thyroid Gland Characteristics - non-tender.  Eye Eyeball - Left-Extraocular movements intact, No Nystagmus - Left. Eyeball - Right-Extraocular movements intact, No Nystagmus - Right. Cornea - Left-No Hazy - Left. Cornea - Right-No Hazy - Right. Sclera/Conjunctiva - Left-No scleral icterus, No Discharge - Left. Sclera/Conjunctiva - Right-No scleral icterus, No Discharge - Right. Pupil - Left-Direct reaction to light normal. Pupil - Right-Direct reaction to light normal.  ENMT Ears Pinna - Left - no drainage observed, no generalized tenderness observed. Pinna - Right - no drainage observed, no generalized tenderness observed. Nose and Sinuses External Inspection of the Nose - no destructive lesion observed. Inspection of the nares - Left - quiet respiration. Inspection of the nares - Right - quiet respiration. Mouth and Throat Lips - Upper Lip - no fissures observed, no pallor noted. Lower Lip - no fissures observed, no pallor noted. Nasopharynx - no discharge present. Oral Cavity/Oropharynx - Tongue - no dryness observed. Oral Mucosa - no cyanosis observed. Hypopharynx - no evidence of airway distress observed.  Chest and Lung Exam Inspection Movements - Normal and Symmetrical. Accessory muscles - No use of accessory muscles in breathing. Palpation Palpation of the chest reveals - Non-tender. Auscultation Breath sounds - Normal and Clear.  Cardiovascular Auscultation Rhythm - Regular. Murmurs & Other Heart Sounds - Auscultation of the heart reveals - No Murmurs and No Systolic Clicks.  Abdomen Inspection Inspection of the abdomen reveals - No Visible peristalsis and No Abnormal pulsations. Umbilicus - No Bleeding, No Urine drainage. Palpation/Percussion Palpation and Percussion of the abdomen reveal  - Soft, Non Tender, No Rebound tenderness, No Rigidity (guarding) and No Cutaneous hyperesthesia. Note: Abdomen obese but soft. Periumbilical moderate fascial defect sensitive. Feels like bowel but reducible. 9 x 4 cm region. Not severely distended. No diastasis recti. No umbilical or other anterior abdominal wall hernias  Female Genitourinary Sexual Maturity Tanner 5 - Adult hair pattern. Note: No vaginal bleeding nor discharge  Peripheral Vascular Upper Extremity Inspection - Left - No Cyanotic nailbeds - Left, Not Ischemic. Inspection - Right - No Cyanotic nailbeds - Right, Not Ischemic.  Neurologic Neurologic evaluation reveals -normal attention span and ability to concentrate, able to name objects and repeat phrases. Appropriate fund of knowledge , normal sensation and normal coordination. Mental Status Affect - not angry, not paranoid. Cranial Nerves-Normal Bilaterally. Gait-Normal.  Neuropsychiatric Mental status exam performed with findings of-able to articulate well with normal speech/language, rate, volume and coherence, thought content normal with ability to perform basic computations and apply abstract reasoning and no evidence of hallucinations, delusions, obsessions or homicidal/suicidal ideation. Note: Mildly anxious but pleasant.  Musculoskeletal Global Assessment Spine, Ribs and Pelvis - no instability, subluxation or laxity. Right Upper Extremity - no instability, subluxation or laxity.  Lymphatic Head & Neck  General Head & Neck Lymphatics: Bilateral - Description - No Localized lymphadenopathy. Axillary  General Axillary Region: Bilateral - Description - No Localized lymphadenopathy. Femoral & Inguinal  Generalized Femoral & Inguinal Lymphatics: Left - Description - No Localized lymphadenopathy. Right - Description - No Localized lymphadenopathy.    Assessment & Plan Pamela Sportsman MD; 06/19/2019 9:29 AM)  Pamela Malone HERNIA, WITHOUT  OBSTRUCTION OR GANGRENE (K43.2) Impression: Obvious periumbilical incisional hernias in an obese woman requiring urgent colorectal surgery. I believe she was on methotrexate around the time of surgery. She is no longer systemically immunosuppressed. Using topical medications instead.  I do think she would benefit from surgery reduced  repair this, especially in light of the fact that she is becoming symptomatic.  Start laparoscopically to free adhesions & reduce. Large underlay mesh. Transition some of that retrorectus/preperitoneal. She may require 1 or 2 component separations to eventually get this to come together. She is interested in proceeding. I did caution her that hernia surgeries hurt & she will need some pain medication. She is optimistic she would not need much but is rather sensitive already prior to surgery. She is motivated to get this fixed.   PREOP - [facility] - ENCOUNTER FOR PREOPERATIVE EXAMINATION FOR GENERAL SURGICAL PROCEDURE (Z01.818)  Current Plans You are being scheduled for surgery- Our schedulers will call you.  You should hear from our office's scheduling department within 5 working days about the location, date, and time of surgery. We try to make accommodations for patient's preferences in scheduling surgery, but sometimes the OR schedule or the surgeon's schedule prevents [place] from making those accommodations.  If you have not heard from our office [phone]) in 5 working days, call the office and ask for your surgeon's nurse.  If you have other questions about your diagnosis, plan, or surgery, call the office and ask for your surgeon's nurse.  Written instructions provided The anatomy & physiology of the abdominal wall was discussed. The pathophysiology of hernias was discussed. Natural history risks without surgery including progeressive enlargement, pain, incarceration, & strangulation was discussed. Contributors to complications such as smoking, obesity,  diabetes, prior surgery, etc were discussed.  I feel the risks of no intervention will lead to serious problems that outweigh the operative risks; therefore, I recommended surgery to reduce and repair the hernia. I explained laparoscopic techniques with possible need for an open approach. I noted the probable use of mesh to patch and/or buttress the hernia repair  Risks such as bleeding, infection, abscess, need for further treatment, heart attack, death, and other risks were discussed. I noted a good likelihood this will help address the problem. Goals of post-operative recovery were discussed as well. Possibility that this will not correct all symptoms was explained. I stressed the importance of low-impact activity, aggressive pain control, avoiding constipation, & not pushing through pain to minimize risk of post-operative chronic pain or injury. Possibility of reherniation especially with smoking, obesity, diabetes, immunosuppression, and other health conditions was discussed. We will work to minimize complications.  An educational handout further explaining the pathology & treatment options was given as well. Questions were answered. The patient expresses understanding & wishes to proceed with surgery.  Pt Education - [facility] Hernia Post-Op HCI (Pamela Malone): discussed with patient and provided information. Pt Education - [facility] Pain Control (Pamela Malone) Pt Education - Pamphlet Given - Laparoscopic Hernia Repair: discussed with patient and provided information. Pt Education - [facility] Mesh education: discussed with patient and provided information.  OBESITY (BMI 35.0-39.9 WITHOUT COMORBIDITY) (E66.9)  Pamela Sportsman, MD, FACS, MASCRS Gastrointestinal and Minimally Invasive Surgery    [address], [address] [place], [place] [address] [phone] Main / Paging [phone] Fax

## 2019-06-28 ENCOUNTER — Ambulatory Visit: Payer: Self-pay | Admitting: Surgery

## 2019-08-14 NOTE — Patient Instructions (Signed)
DUE TO COVID-19 ONLY ONE VISITOR IS ALLOWED TO COME WITH YOU AND STAY IN THE WAITING ROOM ONLY DURING PRE OP AND PROCEDURE DAY OF SURGERY. THE 1 VISITOR MAY VISIT WITH YOU AFTER SURGERY IN YOUR PRIVATE ROOM DURING VISITING HOURS ONLY!  YOU NEED TO HAVE A COVID 19 TEST ON_______ @_______ , THIS TEST MUST BE DONE BEFORE SURGERY, COME  [address], [place] , .  [facility]) ONCE YOUR COVID TEST IS COMPLETED, PLEASE BEGIN THE QUARANTINE INSTRUCTIONS AS OUTLINED IN YOUR HANDOUT.                Pamela Malone  08/14/2019   Your procedure is scheduled on: 08-18-19   Report to [facility] Main  Entrance   Report to  Short stay  at           0530 AM     Call this number if you have problems the morning of surgery [phone]    Remember: Do not eat food or drink liquids :After Midnight.   BRUSH YOUR TEETH MORNING OF SURGERY AND RINSE YOUR MOUTH OUT, NO CHEWING GUM CANDY OR MINTS.     Take these medicines the morning of surgery with A SIP OF WATER: omeprazole, celexa, zyrtec, tylenol if needed                                 You may not have any metal on your body including hair pins and              piercings  Do not wear jewelry, make-up, lotions, powders or perfumes, deodorant             Do not wear nail polish on your fingernails.  Do not shave  48 hours prior to surgery.     Do not bring valuables to the hospital. [facility] IS NOT             RESPONSIBLE   FOR VALUABLES.  Contacts, dentures or bridgework may not be worn into surgery.      Name and phone number of your driver:  Special Instructions: N/A              Please read over the following fact sheets you were given: _____________________________________________________________________             [facility] - Preparing for Surgery Before surgery, you can play an important role.  Because skin is not sterile, your skin needs to be as free of germs as possible.  You can  reduce the number of germs on your skin by washing with CHG (chlorahexidine gluconate) soap before surgery.  CHG is an antiseptic cleaner which kills germs and bonds with the skin to continue killing germs even after washing. Please DO NOT use if you have an allergy to CHG or antibacterial soaps.  If your skin becomes reddened/irritated stop using the CHG and inform your nurse when you arrive at Short Stay. Do not shave (including legs and underarms) for at least 48 hours prior to the first CHG shower.  You may shave your face/neck. Please follow these instructions carefully:  1.  Shower with CHG Soap the night before surgery and the  morning of Surgery.  2.  If you choose to wash your hair, wash your hair first as usual with your  normal  shampoo.  3.  After you shampoo, rinse your hair  and body thoroughly to remove the  shampoo.                           4.  Use CHG as you would any other liquid soap.  You can apply chg directly  to the skin and wash                       Gently with a scrungie or clean washcloth.  5.  Apply the CHG Soap to your body ONLY FROM THE NECK DOWN.   Do not use on face/ open                           Wound or open sores. Avoid contact with eyes, ears mouth and genitals (private parts).                       Wash face,  Genitals (private parts) with your normal soap.             6.  Wash thoroughly, paying special attention to the area where your surgery  will be performed.  7.  Thoroughly rinse your body with warm water from the neck down.  8.  DO NOT shower/wash with your normal soap after using and rinsing off  the CHG Soap.                9.  Pat yourself dry with a clean towel.            10.  Wear clean pajamas.            11.  Place clean sheets on your bed the night of your first shower and do not  sleep with pets. Day of Surgery : Do not apply any lotions/deodorants the morning of surgery.  Please wear clean clothes to the hospital/surgery center.  FAILURE TO  FOLLOW THESE INSTRUCTIONS MAY RESULT IN THE CANCELLATION OF YOUR SURGERY PATIENT SIGNATURE_________________________________  NURSE SIGNATURE__________________________________  ________________________________________________________________________

## 2019-08-15 ENCOUNTER — Other Ambulatory Visit (HOSPITAL_COMMUNITY)
Admission: RE | Admit: 2019-08-15 | Discharge: 2019-08-15 | Disposition: A | Discharge status: Home / Self Care | Payer: 59 | Source: Ambulatory Visit | Attending: Surgery | Admitting: Surgery

## 2019-08-15 ENCOUNTER — Encounter (HOSPITAL_COMMUNITY): Payer: Self-pay

## 2019-08-15 ENCOUNTER — Other Ambulatory Visit: Payer: Self-pay

## 2019-08-15 ENCOUNTER — Encounter (HOSPITAL_COMMUNITY)
Admission: RE | Admit: 2019-08-15 | Discharge: 2019-08-15 | Disposition: A | Discharge status: Home / Self Care | Payer: 59 | Source: Ambulatory Visit | Attending: Surgery | Admitting: Surgery

## 2019-08-15 DIAGNOSIS — I4510 Unspecified right bundle-branch block: Secondary | ICD-10-CM | POA: Diagnosis not present

## 2019-08-15 DIAGNOSIS — R9431 Abnormal electrocardiogram [ECG] [EKG]: Secondary | ICD-10-CM | POA: Diagnosis not present

## 2019-08-15 DIAGNOSIS — Z20828 Contact with and (suspected) exposure to other viral communicable diseases: Secondary | ICD-10-CM | POA: Insufficient documentation

## 2019-08-15 DIAGNOSIS — K432 Incisional hernia without obstruction or gangrene: Secondary | ICD-10-CM | POA: Diagnosis not present

## 2019-08-15 DIAGNOSIS — Z01818 Encounter for other preprocedural examination: Secondary | ICD-10-CM | POA: Diagnosis not present

## 2019-08-15 HISTORY — DX: Other specified postprocedural states: R11.2

## 2019-08-15 HISTORY — DX: Personal history of other diseases of the digestive system: Z87.19

## 2019-08-15 HISTORY — DX: Other specified postprocedural states: Z98.890

## 2019-08-15 HISTORY — DX: Other complications of anesthesia, initial encounter: T88.59XA

## 2019-08-15 LAB — BASIC METABOLIC PANEL
Anion gap: 8 (ref 5–15)
BUN: 16 mg/dL (ref 8–23)
CO2: 28 mmol/L (ref 22–32)
Calcium: 9.5 mg/dL (ref 8.9–10.3)
Chloride: 97 mmol/L — ABNORMAL LOW (ref 98–111)
Creatinine, Ser: 0.83 mg/dL (ref 0.44–1.00)
GFR calc Af Amer: >60 mL/min (ref >60)
GFR calc non Af Amer: >60 mL/min (ref >60)
Glucose, Bld: 110 mg/dL — ABNORMAL HIGH (ref 70–99)
Potassium: 4.3 mmol/L (ref 3.5–5.1)
Sodium: 133 mmol/L — ABNORMAL LOW (ref 135–145)

## 2019-08-15 LAB — CBC
HCT: 43.7 % (ref 36.0–46.0)
Hemoglobin: 14.6 g/dL (ref 12.0–15.0)
MCH: 32.2 pg (ref 26.0–34.0)
MCHC: 33.4 g/dL (ref 30.0–36.0)
MCV: 96.3 fL (ref 80.0–100.0)
Platelets: 318 10*3/uL (ref 150–400)
RBC: 4.54 MIL/uL (ref 3.87–5.11)
RDW: 12 % (ref 11.5–15.5)
WBC: 6.7 10*3/uL (ref 4.0–10.5)
nRBC: 0 % (ref 0.0–0.2)

## 2019-08-15 NOTE — Progress Notes (Signed)
PCP - Elizabeth Barnes Cardiologist -   Chest x-ray -  EKG -  Stress Test -  ECHO -  Cardiac Cath -   Sleep Study -  CPAP -   Fasting Blood Sugar -  Checks Blood Sugar _____ times a day  Blood Thinner Instructions: Aspirin Instructions: Last Dose:  Anesthesia review:   Patient denies shortness of breath, fever, cough and chest pain at PAT appointment  NONE   Patient verbalized understanding of instructions that were given to them at the PAT appointment. Patient was also instructed that they will need to review over the PAT instructions again at home before surgery.  

## 2019-08-16 LAB — NOVEL CORONAVIRUS, NAA (HOSP ORDER, SEND-OUT TO REF LAB; TAT 18-24 HRS): SARS-CoV-2, NAA: NOT DETECTED

## 2019-08-17 ENCOUNTER — Encounter (HOSPITAL_COMMUNITY): Payer: Self-pay | Admitting: Anesthesiology

## 2019-08-17 MED ORDER — BUPIVACAINE LIPOSOME 1.3 % IJ SUSP
20.0000 mL | INTRAMUSCULAR | Status: DC
  Filled 2019-08-17: qty 20

## 2019-08-17 NOTE — Anesthesia Preprocedure Evaluation (Addendum)
Anesthesia Evaluation  Patient identified by MRN, date of birth, ID band Patient awake    Reviewed: Allergy & Precautions, NPO status , Patient's Chart, lab work & pertinent test results  History of Anesthesia Complications (+) PONV and history of anesthetic complications  Airway Mallampati: I  TM Distance: >3 FB Neck ROM: Full   Comment: Hypertrophied tonsils Dental no notable dental hx. (+) Teeth Intact, Caps   Pulmonary neg pulmonary ROS,  Snores   Pulmonary exam normal breath sounds clear to auscultation       Cardiovascular hypertension, Pt. on medications Normal cardiovascular exam Rhythm:Regular Rate:Normal  EKG- NSR, RBBB pattern   Neuro/Psych PSYCHIATRIC DISORDERS Depression negative neurological ROS     GI/Hepatic Neg liver ROS, hiatal hernia, GERD  Medicated and Controlled,Hx/o colon Ca S/P hemicolectomy Incarcerated Incisional hernia   Endo/Other  Hyperlipidemia  Renal/GU negative Renal ROS  negative genitourinary   Musculoskeletal  (+) Arthritis , Osteoarthritis,  Psoriasis Scoliosis  Low back pain   Abdominal (+) + obese,   Peds  Hematology negative hematology ROS (+)   Anesthesia Other Findings   Reproductive/Obstetrics HPV                          Anesthesia Physical  Anesthesia Plan  ASA: III  Anesthesia Plan: General   Post-op Pain Management:    Induction: Intravenous  PONV Risk Score and Plan: 4 or greater and Ondansetron, Dexamethasone, Treatment may vary due to age or medical condition and Scopolamine patch - Pre-op  Airway Management Planned: Oral ETT  Additional Equipment:   Intra-op Plan:   Post-operative Plan: Extubation in OR  Informed Consent: I have reviewed the patients History and Physical, chart, labs and discussed the procedure including the risks, benefits and alternatives for the proposed anesthesia with the patient or authorized  representative who has indicated his/her understanding and acceptance.     Dental advisory given  Plan Discussed with: CRNA and Surgeon  Anesthesia Plan Comments:         Anesthesia Quick Evaluation

## 2019-08-18 ENCOUNTER — Encounter (HOSPITAL_COMMUNITY): Payer: Self-pay

## 2019-08-18 ENCOUNTER — Ambulatory Visit (HOSPITAL_COMMUNITY): Payer: 59 | Admitting: Anesthesiology

## 2019-08-18 ENCOUNTER — Other Ambulatory Visit: Payer: Self-pay

## 2019-08-18 ENCOUNTER — Encounter (HOSPITAL_COMMUNITY)
Admission: RE | Disposition: A | Discharge status: Home / Self Care | Payer: Self-pay | Source: Other Acute Inpatient Hospital | Attending: Surgery

## 2019-08-18 ENCOUNTER — Ambulatory Visit (HOSPITAL_COMMUNITY): Payer: 59 | Admitting: Physician Assistant

## 2019-08-18 ENCOUNTER — Observation Stay (HOSPITAL_COMMUNITY)
Admission: RE | Admit: 2019-08-18 | Discharge: 2019-08-19 | Disposition: A | Discharge status: Home / Self Care | Payer: 59 | Source: Other Acute Inpatient Hospital | Attending: Surgery | Admitting: Surgery

## 2019-08-18 DIAGNOSIS — L409 Psoriasis, unspecified: Secondary | ICD-10-CM | POA: Diagnosis present

## 2019-08-18 DIAGNOSIS — Z791 Long term (current) use of non-steroidal anti-inflammatories (NSAID): Secondary | ICD-10-CM | POA: Insufficient documentation

## 2019-08-18 DIAGNOSIS — K219 Gastro-esophageal reflux disease without esophagitis: Secondary | ICD-10-CM | POA: Diagnosis present

## 2019-08-18 DIAGNOSIS — Z85038 Personal history of other malignant neoplasm of large intestine: Secondary | ICD-10-CM | POA: Insufficient documentation

## 2019-08-18 DIAGNOSIS — M6208 Separation of muscle (nontraumatic), other site: Secondary | ICD-10-CM | POA: Diagnosis not present

## 2019-08-18 DIAGNOSIS — Z79899 Other long term (current) drug therapy: Secondary | ICD-10-CM | POA: Insufficient documentation

## 2019-08-18 DIAGNOSIS — Z6834 Body mass index (BMI) 34.0-34.9, adult: Secondary | ICD-10-CM | POA: Diagnosis not present

## 2019-08-18 DIAGNOSIS — Z5331 Laparoscopic surgical procedure converted to open procedure: Secondary | ICD-10-CM | POA: Diagnosis not present

## 2019-08-18 DIAGNOSIS — K430 Incisional hernia with obstruction, without gangrene: Secondary | ICD-10-CM | POA: Diagnosis not present

## 2019-08-18 DIAGNOSIS — F329 Major depressive disorder, single episode, unspecified: Secondary | ICD-10-CM | POA: Diagnosis not present

## 2019-08-18 DIAGNOSIS — F32A Depression, unspecified: Secondary | ICD-10-CM | POA: Diagnosis present

## 2019-08-18 DIAGNOSIS — E785 Hyperlipidemia, unspecified: Secondary | ICD-10-CM | POA: Diagnosis not present

## 2019-08-18 DIAGNOSIS — E669 Obesity, unspecified: Secondary | ICD-10-CM

## 2019-08-18 DIAGNOSIS — I10 Essential (primary) hypertension: Secondary | ICD-10-CM | POA: Diagnosis present

## 2019-08-18 DIAGNOSIS — K432 Incisional hernia without obstruction or gangrene: Secondary | ICD-10-CM | POA: Diagnosis present

## 2019-08-18 HISTORY — PX: LAPAROSCOPIC ASSISTED VENTRAL HERNIA REPAIR: SHX6312

## 2019-08-18 SURGERY — REPAIR, HERNIA, VENTRAL, LAPAROSCOPY-ASSISTED
Anesthesia: General | Site: Abdomen

## 2019-08-18 MED ORDER — OXYCODONE HCL 5 MG PO TABS: 5.0000 mg | ORAL_TABLET | ORAL | Status: DC | PRN

## 2019-08-18 MED ORDER — METOPROLOL TARTRATE 5 MG/5ML IV SOLN: 5.0000 mg | Freq: Four times a day (QID) | INTRAVENOUS | Status: DC | PRN

## 2019-08-18 MED ORDER — ROCURONIUM BROMIDE 10 MG/ML (PF) SYRINGE
PREFILLED_SYRINGE | INTRAVENOUS | Status: AC
  Filled 2019-08-18: qty 10

## 2019-08-18 MED ORDER — PSYLLIUM 95 % PO PACK
1.0000 | PACK | Freq: Two times a day (BID) | ORAL | Status: DC
  Filled 2019-08-18 (×2): qty 1

## 2019-08-18 MED ORDER — CEFAZOLIN SODIUM-DEXTROSE 2-4 GM/100ML-% IV SOLN
2.0000 g | Freq: Three times a day (TID) | INTRAVENOUS | Status: AC
  Administered 2019-08-18 (×2): 2 g via INTRAVENOUS
  Filled 2019-08-18 (×2): qty 100

## 2019-08-18 MED ORDER — DEXAMETHASONE SODIUM PHOSPHATE 10 MG/ML IJ SOLN
INTRAMUSCULAR | Status: DC | PRN
  Administered 2019-08-18: 8 mg via INTRAVENOUS

## 2019-08-18 MED ORDER — AMLODIPINE BESYLATE 5 MG PO TABS
5.0000 mg | ORAL_TABLET | Freq: Every day | ORAL | Status: DC
  Administered 2019-08-19: 10:00:00 5 mg via ORAL
  Filled 2019-08-18: qty 1

## 2019-08-18 MED ORDER — EPHEDRINE SULFATE-NACL 50-0.9 MG/10ML-% IV SOSY
PREFILLED_SYRINGE | INTRAVENOUS | Status: DC | PRN
  Administered 2019-08-18 (×4): 10 mg via INTRAVENOUS

## 2019-08-18 MED ORDER — CITALOPRAM HYDROBROMIDE 20 MG PO TABS
20.0000 mg | ORAL_TABLET | Freq: Every day | ORAL | Status: DC
  Administered 2019-08-19: 20 mg via ORAL
  Filled 2019-08-18: qty 1

## 2019-08-18 MED ORDER — ACETAMINOPHEN 500 MG PO TABS
1000.0000 mg | ORAL_TABLET | ORAL | Status: AC
  Administered 2019-08-18: 06:00:00 1000 mg via ORAL

## 2019-08-18 MED ORDER — BUPIVACAINE LIPOSOME 1.3 % IJ SUSP
INTRAMUSCULAR | Status: DC | PRN
  Administered 2019-08-18: 20 mL

## 2019-08-18 MED ORDER — ENOXAPARIN SODIUM 40 MG/0.4ML ~~LOC~~ SOLN
40.0000 mg | SUBCUTANEOUS | Status: DC
  Administered 2019-08-19: 40 mg via SUBCUTANEOUS
  Filled 2019-08-18: qty 0.4

## 2019-08-18 MED ORDER — OXYCODONE HCL 5 MG PO TABS: 5.0000 mg | ORAL_TABLET | Freq: Once | ORAL | Status: DC | PRN

## 2019-08-18 MED ORDER — ONDANSETRON HCL 4 MG/2ML IJ SOLN: 4.0000 mg | Freq: Four times a day (QID) | INTRAMUSCULAR | Status: DC | PRN

## 2019-08-18 MED ORDER — METHOCARBAMOL 750 MG PO TABS: 750.0000 mg | ORAL_TABLET | Freq: Four times a day (QID) | ORAL | 2 refills | Status: DC | PRN

## 2019-08-18 MED ORDER — METHOCARBAMOL 1000 MG/10ML IJ SOLN
1000.0000 mg | Freq: Four times a day (QID) | INTRAVENOUS | Status: DC | PRN
  Filled 2019-08-18: qty 10

## 2019-08-18 MED ORDER — SALINE SPRAY 0.65 % NA SOLN
1.0000 | NASAL | Status: DC | PRN
  Filled 2019-08-18: qty 44

## 2019-08-18 MED ORDER — KETOROLAC TROMETHAMINE 30 MG/ML IJ SOLN
INTRAMUSCULAR | Status: AC
  Filled 2019-08-18: qty 1

## 2019-08-18 MED ORDER — KETAMINE HCL 10 MG/ML IJ SOLN
INTRAMUSCULAR | Status: DC | PRN
  Administered 2019-08-18: 20 mg via INTRAVENOUS
  Administered 2019-08-18: 40 mg via INTRAVENOUS
  Administered 2019-08-18 (×2): 20 mg via INTRAVENOUS

## 2019-08-18 MED ORDER — CEFAZOLIN SODIUM-DEXTROSE 2-4 GM/100ML-% IV SOLN
INTRAVENOUS | Status: AC
  Filled 2019-08-18: qty 100

## 2019-08-18 MED ORDER — ZOLPIDEM TARTRATE 5 MG PO TABS: 5.0000 mg | ORAL_TABLET | Freq: Every evening | ORAL | Status: DC | PRN

## 2019-08-18 MED ORDER — LACTATED RINGERS IV SOLN: 1000.0000 mL | Freq: Three times a day (TID) | INTRAVENOUS | Status: DC | PRN

## 2019-08-18 MED ORDER — ALUM & MAG HYDROXIDE-SIMETH 200-200-20 MG/5ML PO SUSP: 30.0000 mL | Freq: Four times a day (QID) | ORAL | Status: DC | PRN

## 2019-08-18 MED ORDER — ACETAMINOPHEN 500 MG PO TABS
1000.0000 mg | ORAL_TABLET | Freq: Three times a day (TID) | ORAL | Status: DC
  Administered 2019-08-18 – 2019-08-19 (×4): 1000 mg via ORAL
  Filled 2019-08-18 (×5): qty 2

## 2019-08-18 MED ORDER — BUPIVACAINE-EPINEPHRINE 0.25% -1:200000 IJ SOLN
INTRAMUSCULAR | Status: DC | PRN
  Administered 2019-08-18: 80 mL

## 2019-08-18 MED ORDER — OLMESARTAN-AMLODIPINE-HCTZ 40-5-25 MG PO TABS: 1.0000 | ORAL_TABLET | Freq: Every day | ORAL | Status: DC

## 2019-08-18 MED ORDER — MELOXICAM 15 MG PO TABS
15.0000 mg | ORAL_TABLET | Freq: Every day | ORAL | Status: DC
  Administered 2019-08-18 – 2019-08-19 (×2): 15 mg via ORAL
  Filled 2019-08-18 (×2): qty 1

## 2019-08-18 MED ORDER — HYDROMORPHONE HCL 1 MG/ML IJ SOLN: 0.5000 mg | INTRAMUSCULAR | Status: DC | PRN

## 2019-08-18 MED ORDER — OXYCODONE HCL 5 MG PO TABS: 5.0000 mg | ORAL_TABLET | Freq: Four times a day (QID) | ORAL | 0 refills | Status: AC | PRN

## 2019-08-18 MED ORDER — PROPOFOL 10 MG/ML IV BOLUS
INTRAVENOUS | Status: AC
  Filled 2019-08-18: qty 20

## 2019-08-18 MED ORDER — OXYCODONE HCL 5 MG/5ML PO SOLN: 5.0000 mg | Freq: Once | ORAL | Status: DC | PRN

## 2019-08-18 MED ORDER — CEFAZOLIN SODIUM-DEXTROSE 2-4 GM/100ML-% IV SOLN
2.0000 g | INTRAVENOUS | Status: AC
  Administered 2019-08-18: 2 g via INTRAVENOUS

## 2019-08-18 MED ORDER — POLYVINYL ALCOHOL 1.4 % OP SOLN
1.0000 [drp] | Freq: Two times a day (BID) | OPHTHALMIC | Status: DC | PRN
  Filled 2019-08-18: qty 15

## 2019-08-18 MED ORDER — DIPHENHYDRAMINE HCL 12.5 MG/5ML PO ELIX: 12.5000 mg | ORAL_SOLUTION | Freq: Four times a day (QID) | ORAL | Status: DC | PRN

## 2019-08-18 MED ORDER — BIOTIN 5000 MCG PO CAPS: 5000.0000 ug | ORAL_CAPSULE | Freq: Every evening | ORAL | Status: DC

## 2019-08-18 MED ORDER — VITAMIN D 25 MCG (1000 UNIT) PO TABS
4000.0000 [IU] | ORAL_TABLET | Freq: Every day | ORAL | Status: DC
  Administered 2019-08-18 – 2019-08-19 (×2): 4000 [IU] via ORAL
  Filled 2019-08-18 (×2): qty 4

## 2019-08-18 MED ORDER — LIDOCAINE IN D5W 4-5 MG/ML-% IV SOLN
INTRAVENOUS | Status: DC | PRN
  Administered 2019-08-18: 25 ug/kg/min via INTRAVENOUS

## 2019-08-18 MED ORDER — LORATADINE 10 MG PO TABS
10.0000 mg | ORAL_TABLET | Freq: Every day | ORAL | Status: DC
  Administered 2019-08-19: 10:00:00 10 mg via ORAL
  Filled 2019-08-18: qty 1

## 2019-08-18 MED ORDER — RISAQUAD PO CAPS
1.0000 | ORAL_CAPSULE | Freq: Every day | ORAL | Status: DC
  Administered 2019-08-18: 1 via ORAL
  Filled 2019-08-18: qty 1

## 2019-08-18 MED ORDER — HALOBETASOL PROP-TAZAROTENE 0.01-0.045 % EX LOTN: 1.0000 "application " | TOPICAL_LOTION | Freq: Every day | CUTANEOUS | Status: DC | PRN

## 2019-08-18 MED ORDER — PHENYLEPHRINE 40 MCG/ML (10ML) SYRINGE FOR IV PUSH (FOR BLOOD PRESSURE SUPPORT)
PREFILLED_SYRINGE | INTRAVENOUS | Status: AC
  Filled 2019-08-18: qty 10

## 2019-08-18 MED ORDER — GABAPENTIN 300 MG PO CAPS
ORAL_CAPSULE | ORAL | Status: AC
  Administered 2019-08-18: 06:00:00 300 mg via ORAL
  Filled 2019-08-18: qty 1

## 2019-08-18 MED ORDER — ONDANSETRON 4 MG PO TBDP: 4.0000 mg | ORAL_TABLET | Freq: Four times a day (QID) | ORAL | Status: DC | PRN

## 2019-08-18 MED ORDER — PROCHLORPERAZINE EDISYLATE 10 MG/2ML IJ SOLN: 5.0000 mg | Freq: Four times a day (QID) | INTRAMUSCULAR | Status: DC | PRN

## 2019-08-18 MED ORDER — MIDAZOLAM HCL 2 MG/2ML IJ SOLN
INTRAMUSCULAR | Status: AC
  Filled 2019-08-18: qty 2

## 2019-08-18 MED ORDER — POLYETHYL GLYCOL-PROPYL GLYCOL 0.4-0.3 % OP GEL: Freq: Two times a day (BID) | OPHTHALMIC | Status: DC | PRN

## 2019-08-18 MED ORDER — PHENYLEPHRINE 40 MCG/ML (10ML) SYRINGE FOR IV PUSH (FOR BLOOD PRESSURE SUPPORT)
PREFILLED_SYRINGE | INTRAVENOUS | Status: DC | PRN
  Administered 2019-08-18 (×5): 80 ug via INTRAVENOUS

## 2019-08-18 MED ORDER — KETOROLAC TROMETHAMINE 15 MG/ML IJ SOLN
INTRAMUSCULAR | Status: DC | PRN
  Administered 2019-08-18: 15 mg via INTRAVENOUS

## 2019-08-18 MED ORDER — BUPIVACAINE-EPINEPHRINE 0.25% -1:200000 IJ SOLN
INTRAMUSCULAR | Status: AC
  Filled 2019-08-18: qty 2

## 2019-08-18 MED ORDER — ADULT MULTIVITAMIN W/MINERALS CH
1.0000 | ORAL_TABLET | Freq: Every day | ORAL | Status: DC
  Administered 2019-08-18 – 2019-08-19 (×2): 1 via ORAL
  Filled 2019-08-18 (×2): qty 1

## 2019-08-18 MED ORDER — LIDOCAINE 2% (20 MG/ML) 5 ML SYRINGE
INTRAMUSCULAR | Status: DC | PRN
  Administered 2019-08-18: 80 mg via INTRAVENOUS

## 2019-08-18 MED ORDER — ROCURONIUM BROMIDE 10 MG/ML (PF) SYRINGE
PREFILLED_SYRINGE | INTRAVENOUS | Status: DC | PRN
  Administered 2019-08-18: 10 mg via INTRAVENOUS
  Administered 2019-08-18: 20 mg via INTRAVENOUS
  Administered 2019-08-18: 50 mg via INTRAVENOUS

## 2019-08-18 MED ORDER — FENTANYL CITRATE (PF) 100 MCG/2ML IJ SOLN
INTRAMUSCULAR | Status: AC
  Administered 2019-08-18: 50 ug via INTRAVENOUS
  Filled 2019-08-18: qty 2

## 2019-08-18 MED ORDER — ONDANSETRON HCL 4 MG/2ML IJ SOLN
INTRAMUSCULAR | Status: DC | PRN
  Administered 2019-08-18: 4 mg via INTRAVENOUS

## 2019-08-18 MED ORDER — KETAMINE HCL 10 MG/ML IJ SOLN
INTRAMUSCULAR | Status: AC
  Filled 2019-08-18: qty 1

## 2019-08-18 MED ORDER — PROPOFOL 10 MG/ML IV BOLUS
INTRAVENOUS | Status: DC | PRN
  Administered 2019-08-18: 150 mg via INTRAVENOUS

## 2019-08-18 MED ORDER — LIDOCAINE HCL 2 % IJ SOLN
INTRAMUSCULAR | Status: AC
  Filled 2019-08-18: qty 20

## 2019-08-18 MED ORDER — PANTOPRAZOLE SODIUM 40 MG PO TBEC
80.0000 mg | DELAYED_RELEASE_TABLET | Freq: Every day | ORAL | Status: DC
  Administered 2019-08-19: 80 mg via ORAL
  Filled 2019-08-18: qty 2

## 2019-08-18 MED ORDER — SODIUM CHLORIDE 0.9 % IR SOLN
Status: DC | PRN
  Administered 2019-08-18: 1

## 2019-08-18 MED ORDER — BISACODYL 10 MG RE SUPP: 10.0000 mg | Freq: Every day | RECTAL | Status: DC | PRN

## 2019-08-18 MED ORDER — SODIUM CHLORIDE 0.9 % IV SOLN
INTRAVENOUS | Status: DC
  Administered 2019-08-18: 16:00:00 via INTRAVENOUS

## 2019-08-18 MED ORDER — SIMVASTATIN 20 MG PO TABS
20.0000 mg | ORAL_TABLET | Freq: Every evening | ORAL | Status: DC
  Filled 2019-08-18: qty 1

## 2019-08-18 MED ORDER — ACETAMINOPHEN 500 MG PO TABS: 1000.0000 mg | ORAL_TABLET | Freq: Three times a day (TID) | ORAL | Status: DC | PRN

## 2019-08-18 MED ORDER — GABAPENTIN 300 MG PO CAPS
300.0000 mg | ORAL_CAPSULE | Freq: Two times a day (BID) | ORAL | Status: DC
  Administered 2019-08-18 – 2019-08-19 (×3): 300 mg via ORAL
  Filled 2019-08-18 (×3): qty 1

## 2019-08-18 MED ORDER — SUGAMMADEX SODIUM 200 MG/2ML IV SOLN
INTRAVENOUS | Status: DC | PRN
  Administered 2019-08-18: 200 mg via INTRAVENOUS

## 2019-08-18 MED ORDER — LIDOCAINE 2% (20 MG/ML) 5 ML SYRINGE
INTRAMUSCULAR | Status: AC
  Filled 2019-08-18: qty 5

## 2019-08-18 MED ORDER — LACTATED RINGERS IV BOLUS: 1000.0000 mL | Freq: Three times a day (TID) | INTRAVENOUS | Status: DC | PRN

## 2019-08-18 MED ORDER — FENTANYL CITRATE (PF) 100 MCG/2ML IJ SOLN
25.0000 ug | INTRAMUSCULAR | Status: DC | PRN
  Administered 2019-08-18: 12:00:00 50 ug via INTRAVENOUS

## 2019-08-18 MED ORDER — EPHEDRINE 5 MG/ML INJ
INTRAVENOUS | Status: AC
  Filled 2019-08-18: qty 10

## 2019-08-18 MED ORDER — SIMETHICONE 80 MG PO CHEW: 40.0000 mg | CHEWABLE_TABLET | Freq: Four times a day (QID) | ORAL | Status: DC | PRN

## 2019-08-18 MED ORDER — MIDAZOLAM HCL 5 MG/5ML IJ SOLN
INTRAMUSCULAR | Status: DC | PRN
  Administered 2019-08-18: 2 mg via INTRAVENOUS

## 2019-08-18 MED ORDER — METHOCARBAMOL 500 MG PO TABS: 750.0000 mg | ORAL_TABLET | Freq: Four times a day (QID) | ORAL | Status: DC | PRN

## 2019-08-18 MED ORDER — ONDANSETRON HCL 4 MG/2ML IJ SOLN
INTRAMUSCULAR | Status: AC
  Filled 2019-08-18: qty 2

## 2019-08-18 MED ORDER — FENTANYL CITRATE (PF) 100 MCG/2ML IJ SOLN
25.0000 ug | INTRAMUSCULAR | Status: DC | PRN
  Administered 2019-08-18: 50 ug via INTRAVENOUS

## 2019-08-18 MED ORDER — LACTATED RINGERS IV SOLN
INTRAVENOUS | Status: DC
  Administered 2019-08-18 (×2): via INTRAVENOUS

## 2019-08-18 MED ORDER — MAGIC MOUTHWASH
15.0000 mL | Freq: Four times a day (QID) | ORAL | Status: DC | PRN
  Filled 2019-08-18: qty 15

## 2019-08-18 MED ORDER — METOCLOPRAMIDE HCL 5 MG/ML IJ SOLN: 10.0000 mg | Freq: Once | INTRAMUSCULAR | Status: DC | PRN

## 2019-08-18 MED ORDER — IRBESARTAN 300 MG PO TABS
300.0000 mg | ORAL_TABLET | Freq: Every day | ORAL | Status: DC
  Administered 2019-08-19: 300 mg via ORAL
  Filled 2019-08-18: qty 1

## 2019-08-18 MED ORDER — TACROLIMUS 0.1 % EX OINT: 1.0000 "application " | TOPICAL_OINTMENT | Freq: Every day | CUTANEOUS | Status: DC | PRN

## 2019-08-18 MED ORDER — CLOBETASOL PROPIONATE 0.05 % EX OINT: 1.0000 "application " | TOPICAL_OINTMENT | Freq: Every day | CUTANEOUS | Status: DC | PRN

## 2019-08-18 MED ORDER — DIPHENHYDRAMINE HCL 50 MG/ML IJ SOLN: 12.5000 mg | Freq: Four times a day (QID) | INTRAMUSCULAR | Status: DC | PRN

## 2019-08-18 MED ORDER — HYDROCHLOROTHIAZIDE 25 MG PO TABS
25.0000 mg | ORAL_TABLET | Freq: Every day | ORAL | Status: DC
  Administered 2019-08-19: 25 mg via ORAL
  Filled 2019-08-18: qty 1

## 2019-08-18 MED ORDER — PROCHLORPERAZINE MALEATE 10 MG PO TABS: 10.0000 mg | ORAL_TABLET | Freq: Four times a day (QID) | ORAL | Status: DC | PRN

## 2019-08-18 MED ORDER — ACETAMINOPHEN 500 MG PO TABS
ORAL_TABLET | ORAL | Status: AC
  Administered 2019-08-18: 1000 mg via ORAL
  Filled 2019-08-18: qty 2

## 2019-08-18 MED ORDER — MEPERIDINE HCL 50 MG/ML IJ SOLN: 6.2500 mg | INTRAMUSCULAR | Status: DC | PRN

## 2019-08-18 MED ORDER — ACETAMINOPHEN 500 MG PO TABS
1000.0000 mg | ORAL_TABLET | Freq: Every day | ORAL | Status: DC | PRN
  Administered 2019-08-19: 1000 mg via ORAL

## 2019-08-18 MED ORDER — GABAPENTIN 300 MG PO CAPS
300.0000 mg | ORAL_CAPSULE | ORAL | Status: AC
  Administered 2019-08-18: 06:00:00 300 mg via ORAL

## 2019-08-18 MED ORDER — LIP MEDEX EX OINT
1.0000 "application " | TOPICAL_OINTMENT | Freq: Two times a day (BID) | CUTANEOUS | Status: DC
  Administered 2019-08-18 – 2019-08-19 (×2): 1 via TOPICAL
  Filled 2019-08-18: qty 7

## 2019-08-18 MED ORDER — SUCCINYLCHOLINE CHLORIDE 200 MG/10ML IV SOSY
PREFILLED_SYRINGE | INTRAVENOUS | Status: AC
  Filled 2019-08-18: qty 10

## 2019-08-18 MED ORDER — DEXAMETHASONE SODIUM PHOSPHATE 10 MG/ML IJ SOLN
INTRAMUSCULAR | Status: AC
  Filled 2019-08-18: qty 1

## 2019-08-18 MED ORDER — POLYETHYLENE GLYCOL 3350 17 G PO PACK: 17.0000 g | PACK | Freq: Two times a day (BID) | ORAL | Status: DC | PRN

## 2019-08-18 SURGICAL SUPPLY — 55 items
APL PRP STRL LF DISP 70% ISPRP (MISCELLANEOUS) ×1
APPLIER CLIP 5 13 M/L LIGAMAX5 (MISCELLANEOUS)
APR CLP MED LRG 5 ANG JAW (MISCELLANEOUS)
BINDER ABDOMINAL 12 ML 46-62 (SOFTGOODS) IMPLANT
CABLE HIGH FREQUENCY MONO STRZ (ELECTRODE) ×3 IMPLANT
CANISTER WOUNDNEG PRESSURE 500 (CANNISTER) ×2 IMPLANT
CHLORAPREP W/TINT 26 (MISCELLANEOUS) ×3 IMPLANT
CLIP APPLIE 5 13 M/L LIGAMAX5 (MISCELLANEOUS) IMPLANT
CLOSURE WOUND 1/2 X4 (GAUZE/BANDAGES/DRESSINGS) ×1
COVER SURGICAL LIGHT HANDLE (MISCELLANEOUS) ×3 IMPLANT
COVER WAND RF STERILE (DRAPES) ×3 IMPLANT
DECANTER SPIKE VIAL GLASS SM (MISCELLANEOUS) ×3 IMPLANT
DEVICE SECURE STRAP 25 ABSORB (INSTRUMENTS) IMPLANT
DEVICE TROCAR PUNCTURE CLOSURE (ENDOMECHANICALS) ×3 IMPLANT
DRAPE WARM FLUID 44X44 (DRAPES) ×3 IMPLANT
DRSG TEGADERM 2-3/8X2-3/4 SM (GAUZE/BANDAGES/DRESSINGS) ×7 IMPLANT
DRSG TEGADERM 4X4.75 (GAUZE/BANDAGES/DRESSINGS) ×3 IMPLANT
ELECT REM PT RETURN 15FT ADLT (MISCELLANEOUS) ×3 IMPLANT
EVACUATOR SILICONE 100CC (DRAIN) ×2 IMPLANT
GAUZE SPONGE 2X2 8PLY STRL LF (GAUZE/BANDAGES/DRESSINGS) IMPLANT
GLOVE ECLIPSE 6.5 STRL STRAW (GLOVE) ×2 IMPLANT
GLOVE ECLIPSE 8.0 STRL XLNG CF (GLOVE) ×3 IMPLANT
GLOVE INDICATOR 8.0 STRL GRN (GLOVE) ×3 IMPLANT
GOWN STRL REUS W/TWL XL LVL3 (GOWN DISPOSABLE) ×6 IMPLANT
IRRIG SUCT STRYKERFLOW 2 WTIP (MISCELLANEOUS)
IRRIGATION SUCT STRKRFLW 2 WTP (MISCELLANEOUS) IMPLANT
KIT BASIN OR (CUSTOM PROCEDURE TRAY) ×3 IMPLANT
KIT PREVENA INCISION MGT20CM45 (CANNISTER) ×2 IMPLANT
KIT TURNOVER KIT A (KITS) ×2 IMPLANT
MARKER SKIN DUAL TIP RULER LAB (MISCELLANEOUS) ×3 IMPLANT
MESH VENTRALIGHT ST 12X14IN (Mesh General) ×2 IMPLANT
NDL SPNL 22GX3.5 QUINCKE BK (NEEDLE) IMPLANT
NEEDLE SPNL 22GX3.5 QUINCKE BK (NEEDLE) IMPLANT
PAD POSITIONING PINK XL (MISCELLANEOUS) ×3 IMPLANT
PENCIL SMOKE EVACUATOR (MISCELLANEOUS) IMPLANT
PREVENA INCISION MGT 90 150 (MISCELLANEOUS) ×2 IMPLANT
SCISSORS LAP 5X35 DISP (ENDOMECHANICALS) ×3 IMPLANT
SET TUBE SMOKE EVAC HIGH FLOW (TUBING) ×3 IMPLANT
SLEEVE ADV FIXATION 5X100MM (TROCAR) ×3 IMPLANT
SPONGE GAUZE 2X2 STER 10/PKG (GAUZE/BANDAGES/DRESSINGS)
SPONGE LAP 18X18 RF (DISPOSABLE) ×6 IMPLANT
SPONGE LAP 4X18 RFD (DISPOSABLE) ×2 IMPLANT
STRIP CLOSURE SKIN 1/2X4 (GAUZE/BANDAGES/DRESSINGS) ×3 IMPLANT
SUT MNCRL AB 4-0 PS2 18 (SUTURE) ×7 IMPLANT
SUT PDS AB 0 CT 36 (SUTURE) ×2 IMPLANT
SUT PDS AB 0 CT1 36 (SUTURE) ×2 IMPLANT
SUT PDS AB 1 CT1 27 (SUTURE) ×6 IMPLANT
SUT PDS AB 1 TP1 96 (SUTURE) ×4 IMPLANT
SUT PROLENE 1 CT 1 30 (SUTURE) ×15 IMPLANT
SUT PROLENE 2 0 SH DA (SUTURE) ×2 IMPLANT
TOWEL OR 17X26 10 PK STRL BLUE (TOWEL DISPOSABLE) ×3 IMPLANT
TRAY LAPAROSCOPIC (CUSTOM PROCEDURE TRAY) ×3 IMPLANT
TROCAR ADV FIXATION 11X100MM (TROCAR) IMPLANT
TROCAR ADV FIXATION 5X100MM (TROCAR) ×3 IMPLANT
TROCAR BLADELESS OPT 5 100 (ENDOMECHANICALS) ×3 IMPLANT

## 2019-08-18 NOTE — H&P (Signed)
Pamela Malone DOB: 01-01-1951 Married / Language: [place] / Race: White Female  Patient Care Team: Juluis RainierBarnes, Elizabeth, MD as PCP - General (Family Medicine) Andria MeuseWhite, Christopher M, MD as Consulting Physician (Colon and Rectal Surgery) Karie SodaGross, Shayona Hibbitts, MD as Consulting Physician (General Surgery) Campbell StallGruber, Hope, MD as Consulting Physician (Dermatology) Kerin SalenKarki, Arya, MD as Consulting Physician (Gastroenterology)  Patient sent for surgical consultation at the request of Dr Cliffton AstersWhite  Chief Complaint: Incisional hernia with pain and discomfort. ` ` The patient is a pleasant obese woman. Found to have lower abdominal pain and had CAT scans concerning for inflammation. Underwent endoscopic workup concerning for an ulcerative mass in the ascending colon. Underwent laparoscopic proximal right colectomy with primary umbilical extraction incision in July 2019. She recovered from that well. She's pain and discomfort and swelling in the region. Follow-up with Dr. Cliffton AstersWhite. Concern for incisional hernia. CAT scan showing 2 moderate size. Umbilical defects containing at least transferred, possibly small bowel as well. Referred to me to consider hernia repairs.  Patient comes today by herself. She normally walks 30 minutes a day. He does have history of psoriasis. Was on methotrexate for quite some time. She has been transitioned to topical medications. Followed by Dr. Danella DeisGruber dermatology. Moves her bowels every day. She's had episodes of worsening pain when she sneezes or gets up or moves. She sometimes gets episodes of abdominal pain when she moves in bed at night. She had a couple episodes of nausea and vomiting from coffee. She does have a small but definite hiatal hernia. She has some reflux t but that is usually well controlled with omeprazole. No major changes since. No dysphagia to solids or liquids. No worsening reflux bending over nor lying supine. She is not diabetic. Because of her  persistent abdominal pain concerns, she is interested in seeing if something more definitive to be done  (Review of systems as stated in this history (HPI) or in the review of systems. Otherwise all other 12 point ROS are negative) ` ` `   Allergies (Sabrina Canty, CMA; 06/19/2019 8:48 AM) No Known Drug Allergies [02/09/2018]: Allergies Reconciled  Medication History Kristen Cardinal(Sabrina Canty, CMA; 06/19/2019 8:49 AM) Simvastatin (20MG  Tablet, Oral) Active. Omeprazole (20MG  Capsule DR, Oral) Active. Tribenzor (40-5-12.5MG  Tablet, Oral) Active. CeleXA (20MG  Tablet, Oral) Active. Mobic (15MG  Tablet, Oral) Active. Protopic (0.1% Ointment, External) Active. Clobetasol Propionate (0.05% Gel, External) Active. ZyrTEC Allergy (10MG  Tablet, Oral) Active. Centrum Silver (Oral) Active. Vitamin D (2000UNIT Tablet, Oral) Active. Biotin (5000MCG Tablet, Oral) Active. [employer] Gentle Laxative (5MG  Tablet DR, Oral) Active. GaviLyte-N with Flavor Pack (420GM For Solution, Oral) Active. Medications Reconciled    Vitals (Sabrina Canty CMA; 06/19/2019 8:50 AM) 06/19/2019 8:49 AM Weight: 185.38 lb Height: 61in Body Surface Area: 1.83 m Body Mass Index: 35.03 kg/m  Temp.: 96.82F(Temporal)  Pulse: 98 (Regular)  BP: 122/68 (Sitting, Left Arm, Standard)   08/18/2019 BP 140/84    Pulse 68    Temp 99.3 F (37.4 C) (Oral)    Resp 16    Ht 5\' 1"  (1.549 m)    Wt 83.4 kg    SpO2 99%    BMI 34.73 kg/m       Physical Exam Ardeth Sportsman(Sumaya Riedesel C. Emmanual Gauthreaux MD; 06/19/2019 1:18 PM)  General Mental Status-Alert. General Appearance-Not in acute distress, Not Sickly. Orientation-Oriented X3. Hydration-Well hydrated. Voice-Normal. Note: Pleasant. Mildly anxious. Respectful.  Integumentary Global Assessment Upon inspection and palpation of skin surfaces of the - Axillae: non-tender, no inflammation or ulceration, no drainage. and Distribution of  scalp and body hair is  normal. General Characteristics Temperature - normal warmth is noted.  Head and Neck Head-normocephalic, atraumatic with no lesions or palpable masses. Face Global Assessment - atraumatic, no absence of expression. Neck Global Assessment - no abnormal movements, no bruit auscultated on the right, no bruit auscultated on the left, no decreased range of motion, non-tender. Trachea-midline. Thyroid Gland Characteristics - non-tender.  Eye Eyeball - Left-Extraocular movements intact, No Nystagmus - Left. Eyeball - Right-Extraocular movements intact, No Nystagmus - Right. Cornea - Left-No Hazy - Left. Cornea - Right-No Hazy - Right. Sclera/Conjunctiva - Left-No scleral icterus, No Discharge - Left. Sclera/Conjunctiva - Right-No scleral icterus, No Discharge - Right. Pupil - Left-Direct reaction to light normal. Pupil - Right-Direct reaction to light normal.  ENMT Ears Pinna - Left - no drainage observed, no generalized tenderness observed. Pinna - Right - no drainage observed, no generalized tenderness observed. Nose and Sinuses External Inspection of the Nose - no destructive lesion observed. Inspection of the nares - Left - quiet respiration. Inspection of the nares - Right - quiet respiration. Mouth and Throat Lips - Upper Lip - no fissures observed, no pallor noted. Lower Lip - no fissures observed, no pallor noted. Nasopharynx - no discharge present. Oral Cavity/Oropharynx - Tongue - no dryness observed. Oral Mucosa - no cyanosis observed. Hypopharynx - no evidence of airway distress observed.  Chest and Lung Exam Inspection Movements - Normal and Symmetrical. Accessory muscles - No use of accessory muscles in breathing. Palpation Palpation of the chest reveals - Non-tender. Auscultation Breath sounds - Normal and Clear.  Cardiovascular Auscultation Rhythm - Regular. Murmurs & Other Heart Sounds - Auscultation of the heart reveals - No Murmurs and No  Systolic Clicks.  Abdomen Inspection Inspection of the abdomen reveals - No Visible peristalsis and No Abnormal pulsations. Umbilicus - No Bleeding, No Urine drainage. Palpation/Percussion Palpation and Percussion of the abdomen reveal - Soft, Non Tender, No Rebound tenderness, No Rigidity (guarding) and No Cutaneous hyperesthesia. Note: Abdomen obese but soft. Periumbilical moderate fascial defect sensitive. Feels like bowel but reducible. 9 x 4 cm region. Not severely distended. No diastasis recti. No umbilical or other anterior abdominal wall hernias  Female Genitourinary Sexual Maturity Tanner 5 - Adult hair pattern. Note: No vaginal bleeding nor discharge  Peripheral Vascular Upper Extremity Inspection - Left - No Cyanotic nailbeds - Left, Not Ischemic. Inspection - Right - No Cyanotic nailbeds - Right, Not Ischemic.  Neurologic Neurologic evaluation reveals -normal attention span and ability to concentrate, able to name objects and repeat phrases. Appropriate fund of knowledge , normal sensation and normal coordination. Mental Status Affect - not angry, not paranoid. Cranial Nerves-Normal Bilaterally. Gait-Normal.  Neuropsychiatric Mental status exam performed with findings of-able to articulate well with normal speech/language, rate, volume and coherence, thought content normal with ability to perform basic computations and apply abstract reasoning and no evidence of hallucinations, delusions, obsessions or homicidal/suicidal ideation. Note: Mildly anxious but pleasant.  Musculoskeletal Global Assessment Spine, Ribs and Pelvis - no instability, subluxation or laxity. Right Upper Extremity - no instability, subluxation or laxity.  Lymphatic Head & Neck  General Head & Neck Lymphatics: Bilateral - Description - No Localized lymphadenopathy. Axillary  General Axillary Region: Bilateral - Description - No Localized lymphadenopathy. Femoral &  Inguinal  Generalized Femoral & Inguinal Lymphatics: Left - Description - No Localized lymphadenopathy. Right - Description - No Localized lymphadenopathy.    Assessment & Plan Adin Hector MD; 06/19/2019 9:29 AM)  INCISIONAL HERNIA, WITHOUT OBSTRUCTION OR GANGRENE (K43.2) Impression: Obvious periumbilical incisional hernias in an obese woman requiring urgent colorectal surgery. I believe she was on methotrexate around the time of surgery. She is no longer systemically immunosuppressed. Using topical medications instead.  I do think she would benefit from surgery reduced repair this, especially in light of the fact that she is becoming symptomatic.  Start laparoscopically to free adhesions & reduce. Large underlay mesh. Transition some of that retrorectus/preperitoneal. She may require 1 or 2 component separations to eventually get this to come together. She is interested in proceeding. I did caution her that hernia surgeries hurt & she will need some pain medication. She is optimistic she would not need much but is rather sensitive already prior to surgery. She is motivated to get this fixed.  The anatomy & physiology of the abdominal wall was discussed. The pathophysiology of hernias was discussed. Natural history risks without surgery including progeressive enlargement, pain, incarceration, & strangulation was discussed. Contributors to complications such as smoking, obesity, diabetes, prior surgery, etc were discussed.  I feel the risks of no intervention will lead to serious problems that outweigh the operative risks; therefore, I recommended surgery to reduce and repair the hernia. I explained laparoscopic techniques with possible need for an open approach. I noted the probable use of mesh to patch and/or buttress the hernia repair  Risks such as bleeding, infection, abscess, need for further treatment, heart attack, death, and other risks were discussed. I noted a good  likelihood this will help address the problem. Goals of post-operative recovery were discussed as well. Possibility that this will not correct all symptoms was explained. I stressed the importance of low-impact activity, aggressive pain control, avoiding constipation, & not pushing through pain to minimize risk of post-operative chronic pain or injury. Possibility of reherniation especially with smoking, obesity, diabetes, immunosuppression, and other health conditions was discussed. We will work to minimize complications.    Ardeth Sportsman, MD, FACS, MASCRS Gastrointestinal and Minimally Invasive Surgery    [address] [place], [place] [address] [phone] Main / Paging [phone] Fax

## 2019-08-18 NOTE — Op Note (Signed)
08/18/2019  PATIENT:  Pamela Malone  68 y.o. female  Patient Care Team: Juluis Rainier, MD as PCP - General (Family Medicine) Andria Meuse, MD as Consulting Physician (Colon and Rectal Surgery) Karie Soda, MD as Consulting Physician (General Surgery) Campbell Stall, MD as Consulting Physician (Dermatology) Kerin Salen, MD as Consulting Physician (Gastroenterology)  PRE-OPERATIVE DIAGNOSIS:  INCISIONAL INCARCERATED ABDOMINAL WALL HERNIA  POST-OPERATIVE DIAGNOSIS:  INCISIONAL ABDOMINAL WALL HERNIA  PROCEDURE:    COMPONENT SEPARATION (BILATERAL TRANSVERSUS ABDOMINUS RELEASE (TAR X 2)) INCISIONAL VENTRAL WALL HERNIA REPAIR WITH MESH PARTIAL PANNICULECTOMY DIAGNOSTIC LAPAROSCOPY TAP BLOCK - BILATERAL  SURGEON:  Ardeth Sportsman, MD  ASSISTANT: Nurse   ANESTHESIA:     General  Nerve block provided with liposomal bupivacaine (Experel) mixed with 0.25% bupivacaine as a Bilateral TAP block x 67mL each side at the level of the transverse abdominis & preperitoneal spaces along the flank at the anterior axillary line, from subcostal ridge to iliac crest under laparoscopic guidance   EBL:  Total I/O In: 1600 [I.V.:1600] Out: 125 [Blood:125]  Per anesthesia record  Delay start of Pharmacological VTE agent (>24hrs) due to surgical blood loss or risk of bleeding:  no  DRAINS: none   SPECIMEN:  No Specimen  DISPOSITION OF SPECIMEN:  N/A  COUNTS:  YES  PLAN OF CARE: Admit for overnight observation  PATIENT DISPOSITION:  PACU - hemodynamically stable.  INDICATION: Pleasant obese patient and required a laparoscopically assisted right colectomy for a colon mass last year.  She developed incisional hernia.  Markedly increased in size with discomfort.  Recommendation was made for surgical repair:   The anatomy & physiology of the abdominal wall was discussed. The pathophysiology of hernias was discussed. Natural history risks without surgery including progeressive  enlargement, pain, incarceration & strangulation was discussed. Contributors to complications such as smoking, obesity, diabetes, prior surgery, etc were discussed.  I feel the risks of no intervention will lead to serious problems that outweigh the operative risks; therefore, I recommended surgery to reduce and repair the hernia. I explained laparoscopic techniques with possible need for an open approach. I noted the probable use of mesh to patch and/or buttress the hernia repair.  Given the potentially larger defect, I noted she may need component separation is an open approach as well  Risks such as bleeding, infection, abscess, need for further treatment, heart attack, death, and other risks were discussed. I noted a good likelihood this will help address the problem. Goals of post-operative recovery were discussed as well. Possibility that this will not correct all symptoms was explained. I stressed the importance of low-impact activity, aggressive pain control, avoiding constipation, & not pushing through pain to minimize risk of post-operative chronic pain or injury. Possibility of reherniation especially with smoking, obesity, diabetes, immunosuppression, and other health conditions was discussed. We will work to minimize complications.  An educational handout further explaining the pathology & treatment options was given as well. Questions were answered. The patient expresses understanding & wishes to proceed with surgery.   OR FINDINGS: Large supraumbilical incisional hernias.  15 x 7 cm region.  Significant diastases recti.  Type of repair: Open component separation (TAR release) with retrorectus underlay mesh placement   Placement of mesh: Retrorectus underlay reapr  Name of mesh: Bard Ventralight dual sided (polypropylene / Seprafilm)  Size of mesh: 35x30cm  Orientation: Transverse  Mesh overlap:  5-7cm   DESCRIPTION:   Informed consent was confirmed. The patient underwent general  anaesthesia without difficulty. The patient  was positioned appropriately. VTE prevention in place. The patient's abdomen was clipped, prepped, & draped in a sterile fashion. Surgical timeout confirmed our plan.  The patient was positioned in reverse Trendelenburg. Abdominal entry was gained using optical entry technique in the left upper abdomen. Entry was clean. I induced carbon dioxide insufflation. Camera inspection revealed no injury. Extra ports were carefully placed under direct laparoscopic visualization.   I could see the hernia on the parietal peritoneum under the abdominal wall.  There were minimal adhesions to it and there is no evidence of incarceration or strangulation.  These defects were more numerous and larger than initially anticipated.  Hard to tell given her morbid obesity.  Did not feel that this would be managed well with a laparoscopic approach given the broad defect.  Therefore, I converted to a midline incision.  I opened cephalad to the superior to the hernia under laparoscopic visualization.  I encountered the giant hernia sac.  I freed this peritoneal hernia sac until I came underneath the rectus muscles on both sides.  Because the fascia would not come together, I then proceeded to do component separation.  Freed off the retrorectus fascia off the posterior rectus on the right side.    I then released the transversus abdominis fascia off the anterior abdominal wall  just lateral to the rectus abdominous muscle to come in to that layer of the abdominal wall.  I continued  more laterally until I came to the lateral flank.  Freed off up to the costal ridge and  down to the iliac crest  I did a similar release on the left side.  I got about 6 cm release on the left side and 5 cm on the right.  This help bring the posterior rectus fascia to the midline well.   Because her obesity large defect on the side mesh underlay reinforcement would be appropriate.  I chose a large 35 x 30 cm sheet  of mesh given her morbid obesity and large defect.  It would lay into the retrorectus preperitoneal space.  I placed #1 Prolene sutures around its periphery x12.  I closed the retrorectus fashion the midline using running 0 PDS suture to good result.  Made sure that greater omentum and falciform ligament were the only thing exposed to the closure.  I included the omentum in the closure to help tack that to the peritoneal side of the closure in the hopes that bowel would not get involved again.  I then placed the mesh into the retrorectus transversus abdominis space.  I brought out the stitches along the lateral edges of the mesh at the level of the anterior axillary line on both sides using a laparoscopic Endoclose suture passer such that there were four stitches laterally on each side.  There were 2 stitches along the subcostal ridge and suprapubic regions.  Therefore all 12 stitches were pulled through the fascia.  The mesh laid well brought out tails of the sutures on the superior edge of the mesh in the epigastricregion 2 and in the inferior edge of the mesh had PDS tails brought out the suprapubic region 2.  This allowed the mesh to lay well.  I placed a 6519 [place] drain through the left upper quadrant port site into this retrorectus space.  I did reinspection. Hemostasis was good. Mesh laid well.  I freed off the skin and subcutaneous tissues off of the rectus muscle laterally on both sides.  Had very redundant hernia sac  I then brought the midline anterior rectus fascia together using #1 looped running PDS suture to good result.  Because of her giant redundant tissue and significant adipose tissue, I excised most of her central abdominal flap vertically and a central partial panniculectomy.  This help get rid of redundancy and habits of the closure would be much more snug.   I closed the skin midline with interrupted 4-0 Monocryl running suture.. The skin was closed with Monocryl at the port sites  and Steri-Strips on the fascial stitch puncture sites.  2-0 Prolene suture at the drain site with sterile dressing.  Then placed a Prevena incisional wound VAC over it given her obesity and larger incision than anticipated.  And held a good seal.  Patient is being extubated to go to the recovery room.  I discussed operative findings, updated the patient's status, discussed probable steps to recovery, and gave postoperative recommendations to the patient's spouse.  Recommendations were made.  Questions were answered.  He expressed understanding & appreciation.  Adin Hector, M.D., F.A.C.S. Gastrointestinal and Minimally Invasive Surgery [facility] [address] [place], [place] [address] [phone] Main / Paging

## 2019-08-18 NOTE — Anesthesia Postprocedure Evaluation (Signed)
Anesthesia Post Note  Patient: Pamela Malone  Procedure(s) Performed: LAPAROSCOPIC VENTRAL WALL HERNIA REPAIR WITH MESH AND LYSIS OF ADHESIONS, COMPONENT SEPARATION TIMES TWO, TAR (N/A Abdomen)     Patient location during evaluation: PACU Anesthesia Type: General Level of consciousness: awake and alert and oriented Pain management: pain level controlled Vital Signs Assessment: post-procedure vital signs reviewed and stable Respiratory status: spontaneous breathing, nonlabored ventilation and respiratory function stable Cardiovascular status: blood pressure returned to baseline and stable Postop Assessment: no apparent nausea or vomiting Anesthetic complications: no    Last Vitals:  Vitals:   08/18/19 0600 08/18/19 1112  BP: 140/84 113/65  Pulse: 68 93  Resp: 16 15  Temp: 37.4 C 36.6 C  SpO2: 99% 100%    Last Pain:  Vitals:   08/18/19 1130  TempSrc:   PainSc: 5                  Jaclyn Andy A.

## 2019-08-18 NOTE — Interval H&P Note (Signed)
History and Physical Interval Note:  08/18/2019 6:38 AM  Pamela Malone  has presented today for surgery, with the diagnosis of INCISIONAL INCARCERATED ABDOMINAL WALL HERNIA.  The various methods of treatment have been discussed with the patient and family. After consideration of risks, benefits and other options for treatment, the patient has consented to  Procedure(s): LAPAROSCOPIC VENTRAL WALL HERNIA REPAIR WITH MESH AND LYSIS OF ADHESIONS, POSSIBLE COMPONENT SEPARATION (N/A) as a surgical intervention.  The patient's history has been reviewed, patient examined, no change in status, stable for surgery.  I have reviewed the patient's chart and labs.  Questions were answered to the patient's satisfaction.    I have re-reviewed the the patient's records, history, medications, and allergies.  I have re-examined the patient.  I again discussed intraoperative plans and goals of post-operative recovery.  The patient agrees to proceed.  ANHTHU PERDEW  03/02/1951 [id number]  Patient Care Team: Juluis Rainier, MD as PCP - General (Family Medicine) Andria Meuse, MD as Consulting Physician (Colon and Rectal Surgery) Karie Soda, MD as Consulting Physician (General Surgery) Campbell Stall, MD as Consulting Physician (Dermatology) Kerin Salen, MD as Consulting Physician (Gastroenterology)  Patient Active Problem List   Diagnosis Date Noted  . 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 Arthritis of knee, degenerative 01/25/2015  . History of hyperglycemia 02/20/2013  . 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 Shingles 02/23/2012  . 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 DJD (degenerative joint disease)   . Scoliosis   . Low back pain   . Depression   . GERD (gastroesophageal reflux disease)   . Hyperlipidemia   . Hypertension   . Menopause   . Psoriasis     Past Medical History:  Diagnosis Date  . Cataract    immature  . Complication of anesthesia   . Depression    many years ago  . DJD (degenerative joint disease)   . GERD (gastroesophageal reflux disease)   . History of hiatal hernia   . History of right bundle branch block (RBBB)    released after cardiac evaluation by Dr Donnie Aho 12/2014; gave surgical clearance  . Hyperlipidemia   . Hypertension   . Low back pain   . Menopause   . Nocturia   . PONV (postoperative nausea and vomiting)    [place]  . Psoriasis   . Scoliosis     Past Surgical History:  Procedure Laterality Date  . ANKLE SURGERY Right    from a previous injury as a child   . CARPAL TUNNEL RELEASE  bilateral  . DILATION AND CURETTAGE OF UTERUS  2009  . LAPAROSCOPIC RIGHT HEMI COLECTOMY Right 03/30/2018   Procedure: LAPAROSCOPIC VS OPEN RIGHT HEMI COLECTOMY;  Surgeon: Andria Meuse, MD;  Location: [facility];  Service: General;  Laterality: Right;  . Left Thumb Joint Replacement    . TOTAL KNEE ARTHROPLASTY Left 01/28/2015   Procedure: TOTAL KNEE ARTHROPLASTY;  Surgeon: Dannielle Huh, MD;  Location: [facility];  Service: Orthopedics;  Laterality: Left;    Social History   Socioeconomic History  . Marital status: Married    Spouse name: Romeo Apple  . Number of children: 2  . Years of education: HS grad  . Highest education level: Not on file  Occupational History  . Occupation: Retired  Engineer, production  . Financial resource strain: Not on file  . Food insecurity    Worry: Not on file  Inability: Not on file  . Transportation needs    Medical: Not on file    Non-medical: Not on file  Tobacco Use  . Smoking status: Never Smoker  . Smokeless tobacco: Never Used  Substance and Sexual Activity  . Alcohol use: Yes    Comment: Couple of glasses of wine at night  . Drug use: No  . Sexual activity: Yes  Lifestyle  . Physical activity    Days per week: Not on file    Minutes per session: Not on file  . Stress: Not on file  Relationships  . Social Musicianconnections    Talks on  phone: Not on file    Gets together: Not on file    Attends religious service: Not on file    Active member of club or organization: Not on file    Attends meetings of clubs or organizations: Not on file    Relationship status: Not on file  . Intimate partner violence    Fear of current or ex partner: Not on file    Emotionally abused: Not on file    Physically abused: Not on file    Forced sexual activity: Not on file  Other Topics Concern  . Not on file  Social History Narrative   Lives with her husband.    Family History  Problem Relation Age of Onset  . Hypertension Mother   . Stroke Mother   . Alzheimer's disease Mother   . Diabetes Paternal Grandmother   . Parkinsonism Paternal Grandmother   . Breast cancer Neg Hx     Medications Prior to Admission  Medication Sig Dispense Refill Last Dose  . Biotin 5000 MCG CAPS Take 5,000 mcg by mouth every evening.   08/17/2019 at Unknown time  . cetirizine (ZYRTEC) 10 MG tablet Take 10 mg by mouth daily as needed for allergies.    08/18/2019 at 0500  . Cholecalciferol (VITAMIN D) 2000 units tablet Take 4,000 Units by mouth daily.   08/17/2019 at Unknown time  . citalopram (CELEXA) 20 MG tablet Take 20 mg by mouth daily.    08/18/2019 at 0500  . Halobetasol Prop-Tazarotene (DUOBRII) 0.01-0.045 % LOTN Apply 1 application topically daily as needed (psoriasis).   Past Week at Unknown time  . meloxicam (MOBIC) 15 MG tablet Take 15 mg by mouth daily.    08/17/2019 at Unknown time  . Multiple Vitamin (MULTIVITAMIN WITH MINERALS) TABS tablet Take 1 tablet by mouth daily.    08/17/2019 at Unknown time  . Olmesartan-Amlodipine-HCTZ (TRIBENZOR) 40-5-25 MG TABS Take 1 tablet by mouth daily. 90 tablet 2 08/17/2019 at Unknown time  . omeprazole (PRILOSEC) 20 MG capsule TAKE 1 CAPSULE DAILY (Patient taking differently: Take 20 mg by mouth daily. ) 90 capsule 2 08/18/2019 at 0500  . Polyethyl Glycol-Propyl Glycol (SYSTANE OP) Place 1 drop into both  eyes 2 (two) times daily as needed (dry eyes).    08/18/2019 at 0445  . Probiotic Product (PHILLIPS COLON HEALTH) CAPS Take 1 capsule by mouth at bedtime.   08/17/2019 at Unknown time  . PROTOPIC 0.1 % ointment Apply 1 application topically daily as needed (psoriasis).    08/17/2019 at Unknown time  . psyllium (REGULOID) 0.52 g capsule Take 0.52-1.04 g by mouth See admin instructions. Take 1.04 g in the morning and 0.52 g at night   08/17/2019 at Unknown time  . simvastatin (ZOCOR) 20 MG tablet TAKE 1 TABLET AT BEDTIME (Patient taking differently: Take 20 mg by mouth every  evening. ) 90 tablet 2 08/17/2019 at Unknown time  . sodium chloride (OCEAN) 0.65 % SOLN nasal spray Place 1 spray into both nostrils as needed for congestion.   08/17/2019 at Unknown time  . acetaminophen (TYLENOL) 500 MG tablet Take 1,000 mg by mouth daily as needed for moderate pain or headache.   More than a month at Unknown time  . clobetasol ointment (TEMOVATE) 3.29 % Apply 1 application topically daily as needed (psoriasis).   0 More than a month at Unknown time    Current Facility-Administered Medications  Medication Dose Route Frequency Provider Last Rate Last Dose  . bupivacaine liposome (EXPAREL) 1.3 % injection 266 mg  20 mL Infiltration On Call to OR Michael Boston, MD      . ceFAZolin (ANCEF) 2-4 GM/100ML-% IVPB           . ceFAZolin (ANCEF) IVPB 2g/100 mL premix  2 g Intravenous On Call to OR Michael Boston, MD      . lactated ringers infusion   Intravenous Continuous Michael Boston, MD 10 mL/hr at 08/18/19 [phone]       No Known Allergies  BP 140/84   Pulse 68   Temp 99.3 F (37.4 C) (Oral)   Resp 16   Ht 5\' 1"  (1.549 m)   Wt 83.4 kg   SpO2 99%   BMI 34.73 kg/m   Labs: No results found for this or any previous visit (from the past 48 hour(s)).  Imaging / Studies: No results found.   Adin Hector, M.D., F.A.C.S. Gastrointestinal and Minimally Invasive Surgery [facility] [address] [place], [place] [address] [phone] Main / Paging  08/18/2019 6:38 AM    Adin Hector

## 2019-08-18 NOTE — Interval H&P Note (Signed)
History and Physical Interval Note:  08/18/2019 6:32 AM  Pamela Malone  has presented today for surgery, with the diagnosis of [place].  The various methods of treatment have been discussed with the patient and family. After consideration of risks, benefits and other options for treatment, the patient has consented to  Procedure(s): LAPAROSCOPIC VENTRAL WALL HERNIA REPAIR WITH MESH AND LYSIS OF ADHESIONS, POSSIBLE COMPONENT SEPARATION (N/A) as a surgical intervention.  The patient's history has been reviewed, patient examined, no change in status, stable for surgery.  I have reviewed the patient's chart and labs.  Questions were answered to the patient's satisfaction.    I have re-reviewed the the patient's records, history, medications, and allergies.  I have re-examined the patient.  I again discussed intraoperative plans and goals of post-operative recovery.  The patient agrees to proceed.  Pamela Malone  1951/03/31 [id number]  Patient Care Team: Leighton Ruff, MD as PCP - General (Family Medicine) Ileana Roup, MD as Consulting Physician (Colon and Rectal Surgery) Michael Boston, MD as Consulting Physician (General Surgery) Crista Luria, MD as Consulting Physician (Dermatology) Ronnette Juniper, MD as Consulting Physician (Gastroenterology)  Patient Active Problem List   Diagnosis Date Noted  . 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 Arthritis of knee, degenerative 01/25/2015  . History of hyperglycemia 02/20/2013  . 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 Shingles 02/23/2012  . 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 DJD (degenerative joint disease)   . Scoliosis   . Low back pain   . Depression   . GERD (gastroesophageal reflux disease)   . Hyperlipidemia   . Hypertension   . Menopause   . Psoriasis     Past Medical History:  Diagnosis Date  . Cataract    immature  . Complication of anesthesia   . Depression    many years ago  . DJD (degenerative joint disease)   . GERD (gastroesophageal reflux disease)   . History of hiatal hernia   . History of right bundle branch block (RBBB)    released after cardiac evaluation by Dr Wynonia Lawman 12/2014; gave surgical clearance  . Hyperlipidemia   . Hypertension   . Low back pain   . Menopause   . Nocturia   . PON[place] (postoperative nausea and vomiting)    [place]  . Psoriasis   . Scoliosis     Past Surgical History:  Procedure Laterality Date  . ANKLE SURGERY Right    from a previous injury as a child   . CARPAL TUNNEL RELEASE  bilateral  . DILATION AND CURETTAGE OF UTERUS  2009  . LAPAROSCOPIC RIGHT HEMI COLECTOMY Right 03/30/2018   Procedure: LAPAROSCOPIC VS OPEN RIGHT HEMI COLECTOMY;  Surgeon: Ileana Roup, MD;  Location: [facility];  Service: General;  Laterality: Right;  . Left Thumb Joint Replacement    . TOTAL KNEE ARTHROPLASTY Left 01/28/2015   Procedure: TOTAL KNEE ARTHROPLASTY;  Surgeon: Vickey Huger, MD;  Location: [facility];  Service: Orthopedics;  Laterality: Left;    Social History   Socioeconomic History  . Marital status: Married    Spouse name: Suezanne Jacquet  . Number of children: 2  . Years of education: HS grad  . Highest education level: Not on file  Occupational History  . Occupation: Retired  Scientific laboratory technician  . Financial resource strain: Not on file  . Food insecurity    Worry: Not on file  Inability: Not on file  . Transportation needs    Medical: Not on file    Non-medical: Not on file  Tobacco Use  . Smoking status: Never Smoker  . Smokeless tobacco: Never Used  Substance and Sexual Activity  . Alcohol use: Yes    Comment: Couple of glasses of wine at night  . Drug use: No  . Sexual activity: Yes  Lifestyle  . Physical activity    Days per week: Not on file    Minutes per session: Not on file  . Stress: Not on file  Relationships  . Social Musicianconnections    Talks on  phone: Not on file    Gets together: Not on file    Attends religious service: Not on file    Active member of club or organization: Not on file    Attends meetings of clubs or organizations: Not on file    Relationship status: Not on file  . Intimate partner violence    Fear of current or ex partner: Not on file    Emotionally abused: Not on file    Physically abused: Not on file    Forced sexual activity: Not on file  Other Topics Concern  . Not on file  Social History Narrative   Lives with her husband.    Family History  Problem Relation Age of Onset  . Hypertension Mother   . Stroke Mother   . Alzheimer's disease Mother   . Diabetes Paternal Grandmother   . Parkinsonism Paternal Grandmother   . Breast cancer Neg Hx     Medications Prior to Admission  Medication Sig Dispense Refill Last Dose  . Biotin 5000 MCG CAPS Take 5,000 mcg by mouth every evening.   08/17/2019 at Unknown time  . cetirizine (ZYRTEC) 10 MG tablet Take 10 mg by mouth daily as needed for allergies.    08/18/2019 at 0500  . Cholecalciferol (VITAMIN D) 2000 units tablet Take 4,000 Units by mouth daily.   08/17/2019 at Unknown time  . citalopram (CELEXA) 20 MG tablet Take 20 mg by mouth daily.    08/18/2019 at 0500  . Halobetasol Prop-Tazarotene (DUOBRII) 0.01-0.045 % LOTN Apply 1 application topically daily as needed (psoriasis).   Past Week at Unknown time  . meloxicam (MOBIC) 15 MG tablet Take 15 mg by mouth daily.    08/17/2019 at Unknown time  . Multiple Vitamin (MULTIVITAMIN WITH MINERALS) TABS tablet Take 1 tablet by mouth daily.    08/17/2019 at Unknown time  . Olmesartan-Amlodipine-HCTZ (TRIBENZOR) 40-5-25 MG TABS Take 1 tablet by mouth daily. 90 tablet 2 08/17/2019 at Unknown time  . omeprazole (PRILOSEC) 20 MG capsule TAKE 1 CAPSULE DAILY (Patient taking differently: Take 20 mg by mouth daily. ) 90 capsule 2 08/18/2019 at 0500  . Polyethyl Glycol-Propyl Glycol (SYSTANE OP) Place 1 drop into both  eyes 2 (two) times daily as needed (dry eyes).    08/18/2019 at 0445  . Probiotic Product (PHILLIPS COLON HEALTH) CAPS Take 1 capsule by mouth at bedtime.   08/17/2019 at Unknown time  . PROTOPIC 0.1 % ointment Apply 1 application topically daily as needed (psoriasis).    08/17/2019 at Unknown time  . psyllium (REGULOID) 0.52 g capsule Take 0.52-1.04 g by mouth See admin instructions. Take 1.04 g in the morning and 0.52 g at night   08/17/2019 at Unknown time  . simvastatin (ZOCOR) 20 MG tablet TAKE 1 TABLET AT BEDTIME (Patient taking differently: Take 20 mg by mouth every  evening. ) 90 tablet 2 08/17/2019 at Unknown time  . sodium chloride (OCEAN) 0.65 % SOLN nasal spray Place 1 spray into both nostrils as needed for congestion.   08/17/2019 at Unknown time  . acetaminophen (TYLENOL) 500 MG tablet Take 1,000 mg by mouth daily as needed for moderate pain or headache.   More than a month at Unknown time  . clobetasol ointment (TEMOVATE) 0.05 % Apply 1 application topically daily as needed (psoriasis).   0 More than a month at Unknown time    Current Facility-Administered Medications  Medication Dose Route Frequency Provider Last Rate Last Dose  . bupivacaine liposome (EXPAREL) 1.3 % injection 266 mg  20 mL Infiltration On Call to OR Karie Soda, MD      . ceFAZolin (ANCEF) 2-4 GM/100ML-% IVPB           . ceFAZolin (ANCEF) IVPB 2g/100 mL premix  2 g Intravenous On Call to OR Karie Soda, MD      . lactated ringers infusion   Intravenous Continuous Karie Soda, MD 10 mL/hr at 08/18/19 [phone]       No Known Allergies  BP 140/84   Pulse 68   Temp 99.3 F (37.4 C) (Oral)   Resp 16   Ht 5\' 1"  (1.549 m)   Wt 83.4 kg   SpO2 99%   BMI 34.73 kg/m   Labs: No results found for this or any previous visit (from the past 48 hour(s)).  Imaging / Studies: No results found.   , M.D., F.A.C.S. Gastrointestinal and Minimally Invasive Surgery [facility] [address] [place], [place] [phone] Main / Paging  08/18/2019 6:33 AM    08/20/2019

## 2019-08-18 NOTE — Transfer of Care (Signed)
Immediate Anesthesia Transfer of Care Note  Patient: Pamela Malone  Procedure(s) Performed: Procedure(s): LAPAROSCOPIC VENTRAL WALL HERNIA REPAIR WITH MESH AND LYSIS OF ADHESIONS, COMPONENT SEPARATION TIMES TWO, TAR (N/A)  Patient Location: PACU  Anesthesia Type:General  Level of Consciousness: Alert, Awake, Oriented  Airway & Oxygen Therapy: Patient Spontanous Breathing  Post-op Assessment: Report given to RN  Post vital signs: Reviewed and stable  Last Vitals:  Vitals:   08/18/19 0600 08/18/19 1112  BP: 140/84 113/65  Pulse: 68 93  Resp: 16 15  Temp: 37.4 C (P) 36.6 C  SpO2: 88% 828%    Complications: No apparent anesthesia complications

## 2019-08-18 NOTE — Anesthesia Procedure Notes (Signed)
Procedure Name: Intubation Date/Time: 08/18/2019 7:30 AM Performed by: Gerald Leitz, CRNA Pre-anesthesia Checklist: Patient identified, Patient being monitored, Timeout performed, Emergency Drugs available and Suction available Patient Re-evaluated:Patient Re-evaluated prior to induction Oxygen Delivery Method: Circle system utilized Preoxygenation: Pre-oxygenation with 100% oxygen Induction Type: IV induction Ventilation: Mask ventilation without difficulty Laryngoscope Size: Mac and 3 Grade View: Grade I Tube type: Oral Tube size: 7.0 mm Number of attempts: 1 Placement Confirmation: ETT inserted through vocal cords under direct vision,  positive ETCO2 and breath sounds checked- equal and bilateral Secured at: 21 cm Tube secured with: Tape Dental Injury: Teeth and Oropharynx as per pre-operative assessment

## 2019-08-19 DIAGNOSIS — K430 Incisional hernia with obstruction, without gangrene: Secondary | ICD-10-CM | POA: Diagnosis not present

## 2019-08-19 LAB — CBC
HCT: 33.3 % — ABNORMAL LOW (ref 36.0–46.0)
Hemoglobin: 11.1 g/dL — ABNORMAL LOW (ref 12.0–15.0)
MCH: 32.6 pg (ref 26.0–34.0)
MCHC: 33.3 g/dL (ref 30.0–36.0)
MCV: 97.7 fL (ref 80.0–100.0)
Platelets: 247 10*3/uL (ref 150–400)
RBC: 3.41 MIL/uL — ABNORMAL LOW (ref 3.87–5.11)
RDW: 12.5 % (ref 11.5–15.5)
WBC: 20 10*3/uL — ABNORMAL HIGH (ref 4.0–10.5)
nRBC: 0 % (ref 0.0–0.2)

## 2019-08-19 LAB — BASIC METABOLIC PANEL
Anion gap: 8 (ref 5–15)
BUN: 13 mg/dL (ref 8–23)
CO2: 26 mmol/L (ref 22–32)
Calcium: 9.5 mg/dL (ref 8.9–10.3)
Chloride: 98 mmol/L (ref 98–111)
Creatinine, Ser: 0.72 mg/dL (ref 0.44–1.00)
GFR calc Af Amer: >60 mL/min (ref >60)
GFR calc non Af Amer: >60 mL/min (ref >60)
Glucose, Bld: 125 mg/dL — ABNORMAL HIGH (ref 70–99)
Potassium: 5 mmol/L (ref 3.5–5.1)
Sodium: 132 mmol/L — ABNORMAL LOW (ref 135–145)

## 2019-08-19 LAB — MAGNESIUM: Magnesium: 1.9 mg/dL (ref 1.7–2.4)

## 2019-08-19 NOTE — Discharge Instructions (Signed)
You have a bulb drain helping prevent any deep fluid collections in your abdominal wall.  Follow the output and see instructions below.  Most likely you will need a drain removed in about 10 days.  Once output is less than 30 mL a day)  You have an incisional wound VAC (Praveena).  You will go home with this.  When it runs out in about 5 days at home, remove all the dressings in the shower.   DRAIN CARE:   You have a closed bulb drain to help you heal.    A bulb drain is a small, plastic reservoir which creates a gentle suction. It is used to remove excess fluid from a surgical wound. The color and amount of fluid will vary. Immediately after surgery, the fluid is bright red. It may gradually change to a yellow color. When the amount decreases to about 1 or 2 tablespoons (15 to 30 cc) per 24 hours, your caregiver will usually remove it.  JP Care  The Jackson-Pratt drainage system has flexible tubing attached to a soft, plastic bulb with a stopper. The drainage end of the tubing, which is flat and white, goes into your body through a small opening near your incision (surgical cut). A stitch holds the drainage end in place. The rest of the tube is outside your body, attached to the bulb. When the bulb is compressed with the stopper in place, it creates a vacuum. This causes a constant gentle suction, which helps draw out fluid that collects under your incision. The bulb should be compressed at all times, except when you are emptying the drainage.  How long you will have your Jackson-Pratt depends on your surgery and the amount of fluid is draining. This is different for everyone. The Jackson-Pratt is usually removed when the drainage is 30 mL or less over 24 hours. To keep track of how much drainage youre having, you will record the amount in a drainage log. Its important to bring the log with you to your follow-up appointments.  Caring for Your Jackson-Pratt at Home In order to care for your  Jackson-Pratt at home, you or your caregiver will do the following:  Empty the drain once a day and record the color and amount of drainage  Care for the area where the tubing enters your skin by washing with soap and water.  Milk the tubing to help move clots into the bulb.  Do this before you empty and measure your drainage. Look in the mirror at the tubing. This will help you see where your hands need to be. Pinch the tubing close to where it goes into your skin between your thumb and forefinger. With the thumb and forefinger of your other hand, pinch the tubing right below your other fingers. Keep your fingers pinched and slide them down the tubing, pushing any clots down toward the bulb. You may want to use alcohol swabs to help you slide your fingers down the tubing. Repeat steps 3 and 4 as necessary to push clots from the tubing into the bulb. If you are not able to move a clot into the bulb, call your doctors office. The fluid may leak around the insertion site if a clot is blocking the drainage flow. If there is fluid in the bulb and no leakage at the insertion site, the drain is working.  How to Empty Your Jackson-Pratt and Record the Drainage You will need to empty your Jackson-Pratt every day  Gather the following  supplies:  Measuring container your nurse gave you Jackson-Pratt Drainage Record  Pen or pencil  Instructions Clean an area to work on. Clean your hands thoroughly. Unplug the stopper on top of your Jackson-Pratt. This will cause the bulb to expand. Do not touch the inside of the stopper or the inner area of the opening on the bulb. Turn your Jackson-Pratt upside down, gently squeeze the bulb, and pour the drainage into the measuring container. Turn your Jackson-Pratt right side up. Squeeze the bulb until your fingers feel the palm of your hand. Keep squeezing the bulb while you replug the stopper. Make sure the bulb stays fully compressed to ensure constant,  gentle suction.    Check the amount and color of drainage in the measuring container. The first couple days after surgery the fluid may be dark red. This is normal. As you heal the fluid may look pink or pale yellow. Record this amount and the color of drainage on your Jackson-Pratt Drainage Record. Flush the drainage down the toilet and rinse the measuring container with water.  Caring for the Insertion Site  Once you have emptied the drainage, clean your hands again. Check the area around the insertion site. Look for tenderness, swelling, or pus. If you have any of these, or if you have a temperature of 101 F (38.3 C) or higher, you may have an infection. Call your doctors office.  Sometimes, the drain causes redness the size of a dime at your insertion site. This is normal. Your healthcare provider will tell you if you should place a bandage over the insertion site.  Wash drain site with soap & water (dilute hydrogen peroxide PRN) daily & replace clean dressing / tape    DAILY CARE  Keep the bulb compressed at all times, except while emptying it. The compression creates suction.   Keep sites where the tubes enter the skin dry and covered with a light bandage (dressing).   Tape the tubes to your skin, 1 to 2 inches below the insertion sites, to keep from pulling on your stitches. Tubes are stitched in place and will not slip out.   Pin the bulb to your shirt (not to your pants) with a safety pin.   For the first few days after surgery, there usually is more fluid in the bulb. Empty the bulb whenever it becomes half full because the bulb does not create enough suction if it is too full. Include this amount in your 24 hour totals.   When the amount of drainage decreases, empty the bulb at the same time every day. Write down the amounts and the 24 hour totals. Your caregiver will want to know them. This helps your caregiver know when the tubes can be removed.   (We anticipate  removing the drain in 1-3 weeks, depending on when the output is <64mL a day for 2+ days)  If there is drainage around the tube sites, change dressings and keep the area dry. If you see a clot in the tube, leave it alone. However, if the tube does not appear to be draining, let your caregiver know.  TO EMPTY THE BULB  Open the stopper to release suction.   Holding the stopper out of the way, pour drainage into the measuring cup that was sent home with you.   Measure and write down the amount. If there are 2 bulbs, note the amount of drainage from bulb 1 or bulb 2 and keep the totals separate. Your caregiver  will want to know which tube is draining more.   Compress the bulb by folding it in half.   Replace the stopper.   Check the tape that holds the tube to your skin, and pin the bulb to your shirt.  SEEK MEDICAL CARE IF:  The drainage develops a bad odor.   You have an oral temperature above 102 F (38.9 C).   The amount of drainage from your wound suddenly increases or decreases.   You accidentally pull out your drain.   You have any other questions or concerns.  MAKE SURE YOU:   Understand these instructions.   Will watch your condition.   Will get help right away if you are not doing well or get worse.     Call our office if you have any questions about your drain. [phone]    Negative Pressure Wound Therapy Home Guide Negative pressure wound therapy (NPWT) uses a sponge or foam-like material (dressing) placed on or inside the wound. The wound is then covered and sealed with a cover dressing that sticks to your skin (is adhesive). This keeps air out. A tube is attached to the cover dressing, and this tube connects to a small pump. The pump sucks fluid and germs from the wound. NPWT helps to increase blood flow to the wound and heal it from the inside. What are the risks? NPWT is usually safe to use. However, problems can occur, including:  Skin irritation from  the dressing adhesive.  Bleeding.  Infection.  Dehydration. Wounds with large amounts of drainage can cause excessive fluid loss.  Pain. Supplies needed:  A disposable garbage bag.  Soap and water, or hand sanitizer.  Wound cleanser or salt-water solution (saline).  New sponge and cover dressing.  Protective clothing.  Gauze pad.  Vinyl gloves.  Tape.  Skin protectant. This may be a wipe, film, or spray.  Clean or germ-free (sterile) scissors.  Eye protection. How to change your dressing Prepare to change your dressing  1. If told by your health care provider, take pain medicine 30 minutes before changing the dressing. 2. Wash your hands with soap and water. Dry your hands with a clean towel. If soap and water are not available, use hand sanitizer. 3. Set up a clean station for wound care. 4. Open the dressing package so that the sponge dressing remains on the inside of the package. 5. Wear gloves, protective clothing, and eye protection. Remove old dressing  1. Turn off the pump and disconnect the tubing from the dressing. 2. Carefully remove the adhesive cover dressing in the direction of your hair growth. 3. Remove the sponge dressing that is inside the wound. If the sponge sticks, use a wound cleanser or saline solution to wet the sponge and help it come off more easily. 4. Throw the old sponge and cover dressing supplies into the garbage bag. 5. Remove your gloves by grabbing the cuff and turning the glove inside out. Place the gloves in the trash immediately. 6. Wash your hands with soap and water. Dry your hands with a clean towel. If soap and water are not available, use hand sanitizer. Clean your wound  Wear gloves, protective clothing, and eye protection. Follow your health care provider's instructions on how to clean your wound. You may be told to: 1. Clean the wound using a saline solution or a wound cleanser and a clean gauze pad. 2. Pat the wound dry  with a gauze pad. Do not rub the  wound. 3. Throw the gauze pad into the garbage bag. 4. Remove your gloves by grabbing the cuff and turning the glove inside out. Place the gloves in the trash immediately. 5. Wash your hands with soap and water. Dry your hands with a clean towel. If soap and water are not available, use hand sanitizer. Apply new dressing  Wear gloves, protective clothing, and eye protection. 1. If told by your health care provider, apply a skin protectant to any skin that will be exposed to adhesive. Let the skin protectant dry. 2. Cut a piece of new sponge dressing and put it on or in the wound. 3. Using clean scissors, cut a nickel-sized hole in the new cover dressing. 4. Apply the cover dressing. 5. Attach the suction tube over the hole in the cover dressing. 6. Take off your gloves. Put them in the plastic bag with the old dressing. Tie the bag shut and throw it away. 7. Wash your hands with soap and water. Dry your hands with a clean towel. If soap and water are not available, use hand sanitizer. 8. Turn the pump back on. The sponge dressing should collapse. Do not change the settings on the machine without talking to a health care provider. 9. Replace the container in the pump that collects fluid if it is full. Replace the container per the manufacturer's instructions or at least once a week, even if it is not full. General tips and recommendations If the alarm sounds:  Stay calm.  Do not turn off the pump or do anything with the dressing.  Reasons the alarm may go off: ? The battery is low. Change the battery or plug the device into electrical power. ? The dressing has a leak. Find the leak and put tape over the leak. ? The fluid collection container is full. Change the fluid container.  Call your health care provider right away if you cannot fix the problem.  Explain to your health care provider what is happening. Follow his or her instructions. General  instructions  Do not turn off the pump unless told to do so by your health care provider.  Do not turn off the pump for more than 2 hours. If the pump is off for more than 2 hours, the dressing will need to be changed.  If your health care provider says it is okay to shower: ? Do not take the pump into the shower. ? Make sure the wound dressing is protected and sealed. The wound dressing must stay dry.  Check frequently that the machine indicates that therapy is on and that all clamps are open.  Do not use over-the-counter medicated or antiseptic creams, sprays, liquids, or dressings unless your health care provider approves. Contact a health care provider if:  You have new pain.  You develop irritation, a rash, or itching around the wound or dressing.  You see new black or yellow tissue in your wound.  The dressing changes are painful or cause bleeding.  The pump has been off for more than 2 hours, and you do not know how to change the dressing.  The pump alarm goes off, and you do not know what to do. Get help right away if:  You have a lot of bleeding.  The wound breaks open.  You have severe pain.  You have signs of infection, such as: ? More redness, swelling, or pain. ? More fluid or blood. ? Warmth. ? Pus or a bad smell. ? Red streaks  leading from the wound. ? A fever.  You see a sudden change in the color or texture of the drainage.  You have signs of dehydration, such as: ? Little or no tears, urine, or sweat. ? Muscle cramps. ? Very dry mouth. ? Headache. ? Dizziness. Summary  Negative pressure wound therapy (NPWT) is a device that helps your wound heal.  Set up a clean station for wound care. Your health care provider will tell you what supplies to use.  Follow your health care provider's instructions on how to clean your wound and how to change the dressing.  Contact a health care provider if you have new pain, an irritation, or a rash, or if the  alarm goes off and you do not know what to do.  Get help right away if you have a lot of bleeding, your wound breaks open, or you have severe pain. Also, get help if you have signs of infection. This information is not intended to replace advice given to you by your health care provider. Make sure you discuss any questions you have with your health care provider. Document Released: 12/07/2011 Document Revised: 01/06/2019 Document Reviewed: 12/02/2018 Elsevier Patient Education  2020 [employer]   HERNIA REPAIR: POST OP INSTRUCTIONS  ######################################################################  EAT Gradually transition to a high fiber diet with a fiber supplement over the next few weeks after discharge.  Start with a pureed / full liquid diet (see below)  WALK Walk an hour a day.  Control your pain to do that.    CONTROL PAIN Control pain so that you can walk, sleep, tolerate sneezing/coughing, and go up/down stairs.  HAVE A BOWEL MOVEMENT DAILY Keep your bowels regular to avoid problems.  OK to try a laxative to override constipation.  OK to use an antidairrheal to slow down diarrhea.  Call if not better after 2 tries  CALL IF YOU HAVE PROBLEMS/CONCERNS Call if you are still struggling despite following these instructions. Call if you have concerns not answered by these instructions  ######################################################################    1. DIET: Follow a light bland diet & liquids the first 24 hours after arrival home, such as soup, liquids, starches, etc.  Be sure to drink plenty of fluids.  Quickly advance to a usual solid diet within a few days.  Avoid fast food or heavy meals as your are more likely to get nauseated or have irregular bowels.  A low-fat, high-fiber diet for the rest of your life is ideal.   2. Take your usually prescribed home medications unless otherwise directed.  3. PAIN CONTROL: a. Pain is best controlled by a usual  combination of three different methods TOGETHER: i. Ice/Heat ii. Over the counter pain medication iii. Prescription pain medication b. Most patients will experience some swelling and bruising around the hernia(s) such as the bellybutton, groins, or old incisions.  Ice packs or heating pads (30-60 minutes up to 6 times a day) will help. Use ice for the first few days to help decrease swelling and bruising, then switch to heat to help relax tight/sore spots and speed recovery.  Some people prefer to use ice alone, heat alone, alternating between ice & heat.  Experiment to what works for you.  Swelling and bruising can take several weeks to resolve.   c. It is helpful to take an over-the-counter pain medication regularly for the first few weeks.  Choose one of the following that works best for you: i. Naproxen (Aleve, etc)  Two 220mg  tabs twice  a day ii. Ibuprofen (Advil, etc) Three  tabs four times a day (every meal & bedtime) iii. Acetaminophen (Tylenol, etc) 325-650mg  four times a day (every meal & bedtime) d. A  prescription for pain medication should be given to you upon discharge.  Take your pain medication as prescribed.  i. If you are having problems/concerns with the prescription medicine (does not control pain, nausea, vomiting, rash, itching, etc), please call us [phone] to see if we need to switch you to a different pain medicine that will work better for you and/or control your side effect better. ii. If you need a refill on your pain medication, please contact your pharmacy.  They will contact our office to request authorization. Prescriptions will not be filled after 5 pm or on week-ends.  4. Avoid getting constipated.  Between the surgery and the pain medications, it is common to experience some constipation.  Increasing fluid intake and taking a fiber supplement (such as Metamucil, Citrucel, FiberCon, MiraLax, etc) 1-2 times a day regularly will usually help prevent this  problem from occurring.  A mild laxative (prune juice, Milk of Magnesia, MiraLax, etc) should be taken according to package directions if there are no bowel movements after 48 hours.    5. Wash / shower every day.  You may shower over the dressings as they are waterproof.    6. Remove your waterproof bandages, skin tapes, and other bandages 5 days after surgery. You may replace a dressing/Band-Aid to cover the incision for comfort if you wish. You may leave the incisions open to air.  You may replace a dressing/Band-Aid to cover an incision for comfort if you wish.  Continue to shower over incision(s) after the dressing is off.  7. ACTIVITIES as tolerated:   a. You may resume regular (light) daily activities beginning the next day--such as daily self-care, walking, climbing stairs--gradually increasing activities as tolerated.  Control your pain so that you can walk an hour a day.  If you can walk 30 minutes without difficulty, it is safe to try more intense activity such as jogging, treadmill, bicycling, low-impact aerobics, swimming, etc. b. Save the most intensive and strenuous activity for last such as sit-ups, heavy lifting, contact sports, etc  Refrain from any heavy lifting or straining until you are off narcotics for pain control.   c. DO NOT PUSH THROUGH PAIN.  Let pain be your guide: If it hurts to do something, don't do it.  Pain is your body warning you to avoid that activity for another week until the pain goes down. d. You may drive when you are no longer taking prescription pain medication, you can comfortably wear a seatbelt, and you can safely maneuver your car and apply brakes. e. Bonita Quin may have sexual intercourse when it is comfortable.   8. FOLLOW UP in our office a. Please call [facility] at [phone] to set up an appointment to see your surgeon in the office for a follow-up appointment approximately 2-3 weeks after your surgery. b. Make sure that you call for this appointment the day  you arrive home to insure a convenient appointment time.  9.  If you have disability of FMLA / Family leave forms, please bring the forms to the office for processing.  (do not give to your surgeon).  WHEN TO CALL us [phone]: 1. Poor pain control 2. Reactions / problems with new medications (rash/itching, nausea, etc)  3. Fever over 101.5 F (38.5 C) 4. Inability to urinate  5. Nausea and/or vomiting 6. Worsening swelling or bruising 7. Continued bleeding from incision. 8. Increased pain, redness, or drainage from the incision   The clinic staff is available to answer your questions during regular business hours (8:30am-5pm).  Please dont hesitate to call and ask to speak to one of our nurses for clinical concerns.   If you have a medical emergency, go to the nearest emergency room or call 911.  [facility] is always on call at the hospitals [place]  [facility], [place] [address], [address], [place], [place]  [address] ?  [address] [address], [place]   [id number] MAIN: [phone] ? TOLL FREE: [phone] ? FAX: [phone] [email or web address]

## 2019-08-19 NOTE — Progress Notes (Signed)
Discharge instructions given to patient. Patient had no questions. Writer hooked patient wound to prevena to take home. NT or writer will wheel patient out once she is dressed

## 2019-08-19 NOTE — TOC Progression Note (Signed)
Transition of Care [facility]) - Progression Note    Patient Details  Name: Pamela Malone MRN: [id number] Date of Birth: 06-04-1951  [facility]) CM/SW Contact  Joaquin Courts, RN Phone Number: 08/19/2019, 1:32 PM  Clinical Narrative:    CM received call from bedside RN stating patient to dc today with prevena wound vac, however stand alone pump is not at bedside. CM spoke with central supply who state they do not stock this equipment. CM reached out to [employer]) who state they can order and deliver the equipment once they have model number. CM paged MD for clarification on which model was needed for patient, however MD in OR and not responding to page. CM received call from hospital [employer] rep who was familiar with this case and clarified that the reason prevena was not at bedside was because there dressing required for patient was out of stock and had to be obtained from another hospital, therefore the dressing came in an individual pack and the standalone pump was not included. Rep did help CM with tracking down prevena hospital supply and a standalone pump was obtained and delivered to bedside by CM. CM confirmed with bedside RN that she is familiar with the pump and can apply it to patient for dc home today.     Expected Discharge Plan: Home/Self Care Barriers to Discharge: No Barriers Identified  Expected Discharge Plan and Services Expected Discharge Plan: Home/Self Care   Discharge Planning Services: CM Consult   Living arrangements for the past 2 months: Single Family Home Expected Discharge Date: 08/19/19               DME Arranged: N/A DME Agency: NA       HH Arranged: NA HH Agency: NA         Social Determinants of Health (SDOH) Interventions    Readmission Risk Interventions No flowsheet data found.

## 2019-08-19 NOTE — Discharge Summary (Signed)
Physician Discharge Summary  Patient ID: Pamela Malone MRN: [id number] DOB/AGE: 1951-02-10 68 y.o.  Admit date: 08/18/2019 Discharge date: 08/19/2019  Admission Diagnoses: incisional hernia  Discharge Diagnoses:  Principal Problem:   Incisional hernia Active Problems:   Depression   GERD (gastroesophageal reflux disease)   Hyperlipidemia   Hypertension   Psoriasis   Obesity (BMI 30-39.9)   Discharged Condition: good  Hospital Course: doing well after surgery.  Min pain.  Ambulating well and tolerating a diet.  Having flatus  Consults: None  Significant Diagnostic Studies: labs: cbc, bmet  Treatments: IV hydration and analgesia: acetaminophen  Discharge Exam: Blood pressure 118/87, pulse 92, temperature 98.7 F (37.1 C), temperature source Oral, resp. rate 18, height 5\' 1"  (1.549 m), weight 83.4 kg, SpO2 97 %. General appearance: alert and cooperative GI: normal findings: soft, non-tender Incision/Wound: preveena wound vac intact  Disposition: Discharge disposition: 01-Home or Self Care       Discharge Instructions    Call MD for:   Complete by: As directed    FEVER > 101.5 F  (temperatures < 101.5 F are not significant)   Call MD for:  extreme fatigue   Complete by: As directed    Call MD for:  persistant dizziness or light-headedness   Complete by: As directed    Call MD for:  persistant nausea and vomiting   Complete by: As directed    Call MD for:  redness, tenderness, or signs of infection (pain, swelling, redness, odor or green/yellow discharge around incision site)   Complete by: As directed    Call MD for:  severe uncontrolled pain   Complete by: As directed    Diet - low sodium heart healthy   Complete by: As directed    Start with a bland diet such as soups, liquids, starchy foods, low fat foods, etc. the first few days at home. Gradually advance to a solid, low-fat, high fiber diet by the end of the first week at home.   Add a fiber  supplement to your diet (Metamucil, etc) If you feel full, bloated, or constipated, stay on a full liquid or pureed/blenderized diet for a few days until you feel better and are no longer constipated.   Discharge instructions   Complete by: As directed    See Discharge Instructions If you are not getting better after two weeks or are noticing you are getting worse, contact our office [phone] for further advice.  We may need to adjust your medications, re-evaluate you in the office, send you to the emergency room, or see what other things we can do to help. The clinic staff is available to answer your questions during regular business hours (8:30am-5pm).  Please don't hesitate to call and ask to speak to one of our nurses for clinical concerns.    A surgeon from [facility] is always on call at the hospitals 24 hours/day If you have a medical emergency, go to the nearest emergency room or call 911.   Discharge wound care:   Complete by: As directed    It is good for closed incisions and even open wounds to be washed every day.  Shower every day.  Short baths are fine.  Wash the incisions and wounds clean with soap & water.    You may leave closed incisions open to air if it is dry.   You may cover the incision with clean gauze & replace it after your daily shower for comfort.  DRAIN:  You have a drain in place.  Every day change the dressing in the shower, wash around the skin exit site with soap & water and place a new dressing of gauze or band aid around the skin every day.  Keep the drain site clean & dry.  See separate instructions for drain care and incisional wound VAC care   Driving Restrictions   Complete by: As directed    You may drive when: - you are no longer taking narcotic prescription pain medication - you can comfortably wear a seatbelt - you can safely make sudden turns/stops without pain.   Increase activity slowly   Complete by: As directed    Start  light daily activities --- self-care, walking, climbing stairs- beginning the day after surgery.  Gradually increase activities as tolerated.  Control your pain to be active.  Stop when you are tired.  Ideally, walk several times a day, eventually an hour a day.   Most people are back to most day-to-day activities in a few weeks.  It takes 4-6 weeks to get back to unrestricted, intense activity. If you can walk 30 minutes without difficulty, it is safe to try more intense activity such as jogging, treadmill, bicycling, low-impact aerobics, swimming, etc. Save the most intensive and strenuous activity for last (Usually 4-8 weeks after surgery) such as sit-ups, heavy lifting, contact sports, etc.  Refrain from any intense heavy lifting or straining until you are off narcotics for pain control.  You will have off days, but things should improve week-by-week. DO NOT PUSH THROUGH PAIN.  Let pain be your guide: If it hurts to do something, don't do it.   Lifting restrictions   Complete by: As directed    If you can walk 30 minutes without difficulty, it is safe to try more intense activity such as jogging, treadmill, bicycling, low-impact aerobics, swimming, etc. Save the most intensive and strenuous activity for last (Usually 4-8 weeks after surgery) such as sit-ups, heavy lifting, contact sports, etc.   Refrain from any intense heavy lifting or straining until you are off narcotics for pain control.  You will have off days, but things should improve week-by-week. DO NOT PUSH THROUGH PAIN.  Let pain be your guide: If it hurts to do something, don't do it.  Pain is your body warning you to avoid that activity for another week until the pain goes down.   May shower / Bathe   Complete by: As directed    May walk up steps   Complete by: As directed    Remove dressing in 72 hours   Complete by: As directed    Make sure all dressings are removed by the third day after surgery.  Leave incisions open to air.  OK  to cover incisions with gauze or bandages as desired   Sexual Activity Restrictions   Complete by: As directed    You may have sexual intercourse when it is comfortable. If it hurts to do something, stop.     Allergies as of 08/19/2019   No Known Allergies     Medication List    TAKE these medications   acetaminophen 500 MG tablet Commonly known as: TYLENOL Take 1,000 mg by mouth daily as needed for moderate pain or headache.   Biotin 5000 MCG Caps Take 5,000 mcg by mouth every evening.   cetirizine 10 MG tablet Commonly known as: ZYRTEC Take 10 mg by mouth daily as needed for allergies.   citalopram  20 MG tablet Commonly known as: CELEXA Take 20 mg by mouth daily.   clobetasol ointment 0.05 % Commonly known as: TEMOVATE Apply 1 application topically daily as needed (psoriasis).   Duobrii 0.01-0.045 % Lotn Generic drug: Halobetasol Prop-Tazarotene Apply 1 application topically daily as needed (psoriasis).   meloxicam 15 MG tablet Commonly known as: MOBIC Take 15 mg by mouth daily.   methocarbamol 750 MG tablet Commonly known as: ROBAXIN Take 1 tablet (750 mg total) by mouth 4 (four) times daily as needed (use for muscle cramps/pain).   multivitamin with minerals Tabs tablet Take 1 tablet by mouth daily.   Olmesartan-amLODIPine-HCTZ 40-5-25 MG Tabs Commonly known as: Tribenzor Take 1 tablet by mouth daily.   omeprazole 20 MG capsule Commonly known as: PRILOSEC TAKE 1 CAPSULE DAILY   oxyCODONE 5 MG immediate release tablet Commonly known as: Oxy IR/ROXICODONE Take 1-2 tablets (5-10 mg total) by mouth every 6 (six) hours as needed for moderate pain, severe pain or breakthrough pain.   [facility] Caps Take 1 capsule by mouth at bedtime.   Protopic 0.1 % ointment Generic drug: tacrolimus Apply 1 application topically daily as needed (psoriasis).   psyllium 0.52 g capsule Commonly known as: REGULOID Take 0.52-1.04 g by mouth See admin  instructions. Take 1.04 g in the morning and 0.52 g at night   simvastatin 20 MG tablet Commonly known as: ZOCOR TAKE 1 TABLET AT BEDTIME What changed: when to take this   sodium chloride 0.65 % Soln nasal spray Commonly known as: OCEAN Place 1 spray into both nostrils as needed for congestion.   SYSTANE OP Place 1 drop into both eyes 2 (two) times daily as needed (dry eyes).   Vitamin D 50 MCG (2000 UT) tablet Take 4,000 Units by mouth daily.            Discharge Care Instructions  (From admission, onward)         Start     Ordered   08/18/19 0000  Discharge wound care:    Comments: It is good for closed incisions and even open wounds to be washed every day.  Shower every day.  Short baths are fine.  Wash the incisions and wounds clean with soap & water.    You may leave closed incisions open to air if it is dry.   You may cover the incision with clean gauze & replace it after your daily shower for comfort.  DRAIN:  You have a drain in place.  Every day change the dressing in the shower, wash around the skin exit site with soap & water and place a new dressing of gauze or band aid around the skin every day.  Keep the drain site clean & dry.  See separate instructions for drain care and incisional wound VAC care   08/18/19 1128         Follow-up Information    Karie SodaGross, Steven, MD. Schedule an appointment as soon as possible for a visit in 3 weeks.   Specialty: General Surgery Why: To follow up after your operation, To follow up after your hospital stay Contact information: [address] [place] [address] [phone]        [facility] [place]. Schedule an appointment as soon as possible for a visit in 10 days.   Specialty: General Surgery Why: To have your drain removed & incisions re-checked Contact information: [address] [place] [id number] [phone]  Signed: Rosario Adie 74/16/3845, 10:10 AM

## 2019-08-21 ENCOUNTER — Encounter (HOSPITAL_COMMUNITY): Payer: Self-pay | Admitting: Surgery

## 2019-11-09 ENCOUNTER — Ambulatory Visit: Payer: 59 | Attending: Internal Medicine

## 2019-11-09 DIAGNOSIS — Z23 Encounter for immunization: Secondary | ICD-10-CM | POA: Insufficient documentation

## 2019-11-09 NOTE — Progress Notes (Signed)
   Covid-19 Vaccination Clinic  Name:  Pamela Malone    MRN: [id number] DOB: 09-02-1951  11/09/2019  [name] was observed post Covid-19 immunization for 30 minutes based on pre-vaccination screening without incidence. She was provided with Vaccine Information Sheet and instruction to access the V-Safe system.   [name] was instructed to call 911 with any severe reactions post vaccine: Marland Kitchen Difficulty breathing  . Swelling of your face and throat  . A fast heartbeat  . A bad rash all over your body  . Dizziness and weakness    Immunizations Administered    Name Date Dose VIS Date Route   Pfizer COVID-19 Vaccine 11/09/2019  4:11 PM 0.3 mL 09/08/2019 Intramuscular   Manufacturer: [employer], [employer]   Lot: [id number]   NDC: [id number]

## 2019-12-02 ENCOUNTER — Ambulatory Visit: Payer: 59 | Attending: Internal Medicine

## 2019-12-02 DIAGNOSIS — Z23 Encounter for immunization: Secondary | ICD-10-CM | POA: Insufficient documentation

## 2019-12-02 NOTE — Progress Notes (Signed)
   Covid-19 Vaccination Clinic  Name:  Pamela Malone    MRN: [id number] DOB: 1950/12/19  12/02/2019  Pamela Malone was observed post Covid-19 immunization for 15 minutes without incident. She was provided with Vaccine Information Sheet and instruction to access the V-Safe system.   Pamela Malone was instructed to call 911 with any severe reactions post vaccine: Marland Kitchen Difficulty breathing  . Swelling of face and throat  . A fast heartbeat  . A bad rash all over body  . Dizziness and weakness   Immunizations Administered    Name Date Dose VIS Date Route   Pfizer COVID-19 Vaccine 12/02/2019  1:44 PM 0.3 mL 09/08/2019 Intramuscular   Manufacturer: [employer], [employer]   Lot: [id number]   NDC: [address]-5

## 2019-12-26 ENCOUNTER — Encounter (HOSPITAL_COMMUNITY): Admission: EM | Disposition: A | Discharge status: Home / Self Care | Payer: Self-pay | Source: Home / Self Care

## 2019-12-26 ENCOUNTER — Inpatient Hospital Stay (HOSPITAL_COMMUNITY): Payer: Medicare Other | Admitting: Anesthesiology

## 2019-12-26 ENCOUNTER — Other Ambulatory Visit: Payer: Self-pay

## 2019-12-26 ENCOUNTER — Emergency Department (HOSPITAL_COMMUNITY): Payer: Medicare Other

## 2019-12-26 ENCOUNTER — Inpatient Hospital Stay (HOSPITAL_COMMUNITY)
Admission: EM | Admit: 2019-12-26 | Discharge: 2019-12-28 | DRG: 354 | Disposition: A | Discharge status: Home / Self Care | Payer: Medicare Other | Attending: General Surgery | Admitting: General Surgery

## 2019-12-26 ENCOUNTER — Encounter (HOSPITAL_COMMUNITY): Payer: Self-pay

## 2019-12-26 DIAGNOSIS — M1990 Unspecified osteoarthritis, unspecified site: Secondary | ICD-10-CM | POA: Diagnosis present

## 2019-12-26 DIAGNOSIS — M545 Low back pain: Secondary | ICD-10-CM | POA: Diagnosis present

## 2019-12-26 DIAGNOSIS — Z823 Family history of stroke: Secondary | ICD-10-CM

## 2019-12-26 DIAGNOSIS — I10 Essential (primary) hypertension: Secondary | ICD-10-CM | POA: Diagnosis present

## 2019-12-26 DIAGNOSIS — K91872 Postprocedural seroma of a digestive system organ or structure following a digestive system procedure: Secondary | ICD-10-CM | POA: Diagnosis present

## 2019-12-26 DIAGNOSIS — Z79899 Other long term (current) drug therapy: Secondary | ICD-10-CM

## 2019-12-26 DIAGNOSIS — M419 Scoliosis, unspecified: Secondary | ICD-10-CM | POA: Diagnosis present

## 2019-12-26 DIAGNOSIS — Z96652 Presence of left artificial knee joint: Secondary | ICD-10-CM | POA: Diagnosis present

## 2019-12-26 DIAGNOSIS — F329 Major depressive disorder, single episode, unspecified: Secondary | ICD-10-CM | POA: Diagnosis not present

## 2019-12-26 DIAGNOSIS — R112 Nausea with vomiting, unspecified: Secondary | ICD-10-CM

## 2019-12-26 DIAGNOSIS — Y838 Other surgical procedures as the cause of abnormal reaction of the patient, or of later complication, without mention of misadventure at the time of the procedure: Secondary | ICD-10-CM | POA: Diagnosis present

## 2019-12-26 DIAGNOSIS — Z6833 Body mass index (BMI) 33.0-33.9, adult: Secondary | ICD-10-CM

## 2019-12-26 DIAGNOSIS — K219 Gastro-esophageal reflux disease without esophagitis: Secondary | ICD-10-CM | POA: Diagnosis not present

## 2019-12-26 DIAGNOSIS — E785 Hyperlipidemia, unspecified: Secondary | ICD-10-CM | POA: Diagnosis not present

## 2019-12-26 DIAGNOSIS — K432 Incisional hernia without obstruction or gangrene: Secondary | ICD-10-CM | POA: Diagnosis present

## 2019-12-26 DIAGNOSIS — K430 Incisional hernia with obstruction, without gangrene: Principal | ICD-10-CM | POA: Diagnosis present

## 2019-12-26 DIAGNOSIS — I4510 Unspecified right bundle-branch block: Secondary | ICD-10-CM | POA: Diagnosis present

## 2019-12-26 DIAGNOSIS — Z9049 Acquired absence of other specified parts of digestive tract: Secondary | ICD-10-CM

## 2019-12-26 DIAGNOSIS — Z20822 Contact with and (suspected) exposure to covid-19: Secondary | ICD-10-CM | POA: Diagnosis present

## 2019-12-26 DIAGNOSIS — K449 Diaphragmatic hernia without obstruction or gangrene: Secondary | ICD-10-CM | POA: Diagnosis present

## 2019-12-26 DIAGNOSIS — R188 Other ascites: Secondary | ICD-10-CM | POA: Diagnosis not present

## 2019-12-26 DIAGNOSIS — K458 Other specified abdominal hernia without obstruction or gangrene: Secondary | ICD-10-CM

## 2019-12-26 DIAGNOSIS — Z8249 Family history of ischemic heart disease and other diseases of the circulatory system: Secondary | ICD-10-CM

## 2019-12-26 DIAGNOSIS — L409 Psoriasis, unspecified: Secondary | ICD-10-CM | POA: Diagnosis not present

## 2019-12-26 DIAGNOSIS — Z833 Family history of diabetes mellitus: Secondary | ICD-10-CM | POA: Diagnosis not present

## 2019-12-26 DIAGNOSIS — M48061 Spinal stenosis, lumbar region without neurogenic claudication: Secondary | ICD-10-CM | POA: Diagnosis present

## 2019-12-26 DIAGNOSIS — K436 Other and unspecified ventral hernia with obstruction, without gangrene: Secondary | ICD-10-CM | POA: Diagnosis present

## 2019-12-26 DIAGNOSIS — E669 Obesity, unspecified: Secondary | ICD-10-CM | POA: Diagnosis not present

## 2019-12-26 DIAGNOSIS — Z820 Family history of epilepsy and other diseases of the nervous system: Secondary | ICD-10-CM

## 2019-12-26 DIAGNOSIS — T85898A Other specified complication of other internal prosthetic devices, implants and grafts, initial encounter: Secondary | ICD-10-CM | POA: Diagnosis present

## 2019-12-26 HISTORY — PX: VENTRAL HERNIA REPAIR: SHX424

## 2019-12-26 HISTORY — PX: LAPAROTOMY: SHX154

## 2019-12-26 LAB — COMPREHENSIVE METABOLIC PANEL WITH GFR
ALT: 19 U/L (ref 0–44)
AST: 23 U/L (ref 15–41)
Albumin: 4.4 g/dL (ref 3.5–5.0)
Alkaline Phosphatase: 61 U/L (ref 38–126)
Anion gap: 11 (ref 5–15)
BUN: 19 mg/dL (ref 8–23)
CO2: 27 mmol/L (ref 22–32)
Calcium: 9.7 mg/dL (ref 8.9–10.3)
Chloride: 99 mmol/L (ref 98–111)
Creatinine, Ser: 0.86 mg/dL (ref 0.44–1.00)
GFR calc Af Amer: >60 mL/min
GFR calc non Af Amer: >60 mL/min
Glucose, Bld: 168 mg/dL — ABNORMAL HIGH (ref 70–99)
Potassium: 4.2 mmol/L (ref 3.5–5.1)
Sodium: 137 mmol/L (ref 135–145)
Total Bilirubin: 0.9 mg/dL (ref 0.3–1.2)
Total Protein: 7 g/dL (ref 6.5–8.1)

## 2019-12-26 LAB — URINALYSIS, ROUTINE W REFLEX MICROSCOPIC
Bilirubin Urine: NEGATIVE
Glucose, UA: NEGATIVE mg/dL
Hgb urine dipstick: NEGATIVE
Ketones, ur: 20 mg/dL — AB
Leukocytes,Ua: NEGATIVE
Nitrite: NEGATIVE
Protein, ur: NEGATIVE mg/dL
Specific Gravity, Urine: >1.046 — ABNORMAL HIGH (ref 1.005–1.030)
pH: 6 (ref 5.0–8.0)

## 2019-12-26 LAB — PROTIME-INR
INR: 0.9 (ref 0.8–1.2)
Prothrombin Time: 12.5 seconds (ref 11.4–15.2)

## 2019-12-26 LAB — CBC
HCT: 44.2 % (ref 36.0–46.0)
Hemoglobin: 14.7 g/dL (ref 12.0–15.0)
MCH: 31 pg (ref 26.0–34.0)
MCHC: 33.3 g/dL (ref 30.0–36.0)
MCV: 93.2 fL (ref 80.0–100.0)
Platelets: 318 K/uL (ref 150–400)
RBC: 4.74 MIL/uL (ref 3.87–5.11)
RDW: 13.8 % (ref 11.5–15.5)
WBC: 11.8 K/uL — ABNORMAL HIGH (ref 4.0–10.5)
nRBC: 0 % (ref 0.0–0.2)

## 2019-12-26 LAB — RESPIRATORY PANEL BY RT PCR (FLU A&B, COVID)
Influenza A by PCR: NEGATIVE
Influenza B by PCR: NEGATIVE
SARS Coronavirus 2 by RT PCR: NEGATIVE

## 2019-12-26 LAB — LIPASE, BLOOD: Lipase: 25 U/L (ref 11–51)

## 2019-12-26 LAB — LACTIC ACID, PLASMA: Lactic Acid, Venous: 1.2 mmol/L (ref 0.5–1.9)

## 2019-12-26 LAB — HIV ANTIBODY (ROUTINE TESTING W REFLEX): HIV Screen 4th Generation wRfx: NONREACTIVE

## 2019-12-26 SURGERY — LAPAROTOMY, EXPLORATORY
Anesthesia: General

## 2019-12-26 MED ORDER — HYDROMORPHONE HCL 1 MG/ML IJ SOLN: 1.0000 mg | INTRAMUSCULAR | Status: DC | PRN

## 2019-12-26 MED ORDER — ONDANSETRON HCL 4 MG/2ML IJ SOLN
INTRAMUSCULAR | Status: AC
  Filled 2019-12-26: qty 2

## 2019-12-26 MED ORDER — 0.9 % SODIUM CHLORIDE (POUR BTL) OPTIME
TOPICAL | Status: DC | PRN
  Administered 2019-12-26: 18:00:00 2000 mL

## 2019-12-26 MED ORDER — POTASSIUM CHLORIDE 2 MEQ/ML IV SOLN
INTRAVENOUS | Status: DC
  Filled 2019-12-26 (×3): qty 1000

## 2019-12-26 MED ORDER — DEXAMETHASONE SODIUM PHOSPHATE 10 MG/ML IJ SOLN
INTRAMUSCULAR | Status: AC
  Filled 2019-12-26: qty 1

## 2019-12-26 MED ORDER — LACTATED RINGERS IV SOLN: INTRAVENOUS | Status: DC | PRN

## 2019-12-26 MED ORDER — ONDANSETRON HCL 4 MG/2ML IJ SOLN
INTRAMUSCULAR | Status: DC | PRN
  Administered 2019-12-26: 4 mg via INTRAVENOUS

## 2019-12-26 MED ORDER — FENTANYL CITRATE (PF) 100 MCG/2ML IJ SOLN
50.0000 ug | Freq: Once | INTRAMUSCULAR | Status: AC
  Administered 2019-12-26: 50 ug via INTRAVENOUS

## 2019-12-26 MED ORDER — ONDANSETRON HCL 4 MG/2ML IJ SOLN
4.0000 mg | Freq: Once | INTRAMUSCULAR | Status: AC
  Administered 2019-12-26: 12:00:00 4 mg via INTRAVENOUS
  Filled 2019-12-26: qty 2

## 2019-12-26 MED ORDER — SCOPOLAMINE 1 MG/3DAYS TD PT72
MEDICATED_PATCH | TRANSDERMAL | Status: AC
  Filled 2019-12-26: qty 1

## 2019-12-26 MED ORDER — DEXTROSE-NACL 5-0.9 % IV SOLN: INTRAVENOUS | Status: DC

## 2019-12-26 MED ORDER — SUGAMMADEX SODIUM 200 MG/2ML IV SOLN
INTRAVENOUS | Status: DC | PRN
  Administered 2019-12-26: 200 mg via INTRAVENOUS

## 2019-12-26 MED ORDER — ONDANSETRON HCL 4 MG/2ML IJ SOLN: 4.0000 mg | Freq: Four times a day (QID) | INTRAMUSCULAR | Status: DC | PRN

## 2019-12-26 MED ORDER — LIDOCAINE 2% (20 MG/ML) 5 ML SYRINGE
INTRAMUSCULAR | Status: DC | PRN
  Administered 2019-12-26: 80 mg via INTRAVENOUS

## 2019-12-26 MED ORDER — TACROLIMUS 0.1 % EX OINT: 1.0000 "application " | TOPICAL_OINTMENT | Freq: Every day | CUTANEOUS | Status: DC | PRN

## 2019-12-26 MED ORDER — DEXAMETHASONE SODIUM PHOSPHATE 10 MG/ML IJ SOLN
INTRAMUSCULAR | Status: DC | PRN
  Administered 2019-12-26: 10 mg via INTRAVENOUS

## 2019-12-26 MED ORDER — HYDROMORPHONE HCL 1 MG/ML IJ SOLN: 0.2500 mg | INTRAMUSCULAR | Status: DC | PRN

## 2019-12-26 MED ORDER — PIPERACILLIN-TAZOBACTAM 3.375 G IVPB
3.3750 g | Freq: Three times a day (TID) | INTRAVENOUS | Status: DC
  Administered 2019-12-26 – 2019-12-28 (×5): 3.375 g via INTRAVENOUS
  Filled 2019-12-26 (×4): qty 50

## 2019-12-26 MED ORDER — PROMETHAZINE HCL 25 MG/ML IJ SOLN: 12.5000 mg | Freq: Once | INTRAMUSCULAR | Status: DC | PRN

## 2019-12-26 MED ORDER — FENTANYL CITRATE (PF) 100 MCG/2ML IJ SOLN
INTRAMUSCULAR | Status: AC
  Administered 2019-12-26: 50 ug
  Filled 2019-12-26: qty 2

## 2019-12-26 MED ORDER — MORPHINE SULFATE (PF) 4 MG/ML IV SOLN
4.0000 mg | Freq: Once | INTRAVENOUS | Status: AC
  Administered 2019-12-26: 4 mg via INTRAVENOUS
  Filled 2019-12-26: qty 1

## 2019-12-26 MED ORDER — PANTOPRAZOLE SODIUM 40 MG IV SOLR
40.0000 mg | Freq: Every day | INTRAVENOUS | Status: DC
  Administered 2019-12-26: 40 mg via INTRAVENOUS

## 2019-12-26 MED ORDER — DIPHENHYDRAMINE HCL 12.5 MG/5ML PO ELIX: 12.5000 mg | ORAL_SOLUTION | Freq: Four times a day (QID) | ORAL | Status: DC | PRN

## 2019-12-26 MED ORDER — PHENYLEPHRINE 40 MCG/ML (10ML) SYRINGE FOR IV PUSH (FOR BLOOD PRESSURE SUPPORT)
PREFILLED_SYRINGE | INTRAVENOUS | Status: DC | PRN
  Administered 2019-12-26: 80 ug via INTRAVENOUS

## 2019-12-26 MED ORDER — SUCCINYLCHOLINE CHLORIDE 200 MG/10ML IV SOSY
PREFILLED_SYRINGE | INTRAVENOUS | Status: DC | PRN
  Administered 2019-12-26: 120 mg via INTRAVENOUS

## 2019-12-26 MED ORDER — MORPHINE SULFATE (PF) 2 MG/ML IV SOLN: 2.0000 mg | INTRAVENOUS | Status: DC | PRN

## 2019-12-26 MED ORDER — FENTANYL CITRATE (PF) 100 MCG/2ML IJ SOLN
INTRAMUSCULAR | Status: DC | PRN
  Administered 2019-12-26: 100 ug via INTRAVENOUS
  Administered 2019-12-26 (×3): 50 ug via INTRAVENOUS

## 2019-12-26 MED ORDER — MIDAZOLAM HCL 2 MG/2ML IJ SOLN
INTRAMUSCULAR | Status: AC
  Filled 2019-12-26: qty 2

## 2019-12-26 MED ORDER — BUPIVACAINE HCL (PF) 0.25 % IJ SOLN
INTRAMUSCULAR | Status: DC | PRN
  Administered 2019-12-26: 30 mL

## 2019-12-26 MED ORDER — FENTANYL CITRATE (PF) 250 MCG/5ML IJ SOLN
INTRAMUSCULAR | Status: AC
  Filled 2019-12-26: qty 5

## 2019-12-26 MED ORDER — ROCURONIUM BROMIDE 10 MG/ML (PF) SYRINGE
PREFILLED_SYRINGE | INTRAVENOUS | Status: AC
  Filled 2019-12-26: qty 10

## 2019-12-26 MED ORDER — PROPOFOL 10 MG/ML IV BOLUS
INTRAVENOUS | Status: AC
  Filled 2019-12-26: qty 20

## 2019-12-26 MED ORDER — PHENYLEPHRINE 40 MCG/ML (10ML) SYRINGE FOR IV PUSH (FOR BLOOD PRESSURE SUPPORT)
PREFILLED_SYRINGE | INTRAVENOUS | Status: AC
  Filled 2019-12-26: qty 10

## 2019-12-26 MED ORDER — SODIUM CHLORIDE 0.9% FLUSH
3.0000 mL | Freq: Once | INTRAVENOUS | Status: AC
  Administered 2019-12-26: 3 mL via INTRAVENOUS

## 2019-12-26 MED ORDER — PHENOL 1.4 % MT LIQD
1.0000 | OROMUCOSAL | Status: DC | PRN
  Filled 2019-12-26: qty 177

## 2019-12-26 MED ORDER — ONDANSETRON 4 MG PO TBDP: 4.0000 mg | ORAL_TABLET | Freq: Four times a day (QID) | ORAL | Status: DC | PRN

## 2019-12-26 MED ORDER — MIDAZOLAM HCL 5 MG/5ML IJ SOLN
INTRAMUSCULAR | Status: DC | PRN
  Administered 2019-12-26: 2 mg via INTRAVENOUS

## 2019-12-26 MED ORDER — PROPOFOL 10 MG/ML IV BOLUS
INTRAVENOUS | Status: DC | PRN
  Administered 2019-12-26: 120 mg via INTRAVENOUS

## 2019-12-26 MED ORDER — MEPERIDINE HCL 50 MG/ML IJ SOLN: 6.2500 mg | INTRAMUSCULAR | Status: DC | PRN

## 2019-12-26 MED ORDER — SODIUM CHLORIDE 0.9 % IV SOLN
2.0000 g | Freq: Two times a day (BID) | INTRAVENOUS | Status: DC
  Administered 2019-12-26: 2 g via INTRAVENOUS
  Filled 2019-12-26 (×2): qty 2

## 2019-12-26 MED ORDER — DIPHENHYDRAMINE HCL 50 MG/ML IJ SOLN: 12.5000 mg | Freq: Four times a day (QID) | INTRAMUSCULAR | Status: DC | PRN

## 2019-12-26 MED ORDER — IOHEXOL 300 MG/ML  SOLN
100.0000 mL | Freq: Once | INTRAMUSCULAR | Status: AC | PRN
  Administered 2019-12-26: 100 mL via INTRAVENOUS

## 2019-12-26 MED ORDER — FENTANYL CITRATE (PF) 100 MCG/2ML IJ SOLN
50.0000 ug | Freq: Once | INTRAMUSCULAR | Status: AC
  Administered 2019-12-26: 50 ug via INTRAVENOUS
  Filled 2019-12-26: qty 2

## 2019-12-26 MED ORDER — SCOPOLAMINE 1 MG/3DAYS TD PT72
1.0000 | MEDICATED_PATCH | TRANSDERMAL | Status: DC
  Administered 2019-12-26: 17:00:00 1.5 mg via TRANSDERMAL

## 2019-12-26 MED ORDER — OXYCODONE HCL 5 MG/5ML PO SOLN: 5.0000 mg | Freq: Once | ORAL | Status: DC | PRN

## 2019-12-26 MED ORDER — PANTOPRAZOLE SODIUM 40 MG IV SOLR
40.0000 mg | Freq: Every day | INTRAVENOUS | Status: DC
  Administered 2019-12-27: 40 mg via INTRAVENOUS
  Filled 2019-12-26 (×2): qty 40

## 2019-12-26 MED ORDER — BUPIVACAINE HCL 0.25 % IJ SOLN
INTRAMUSCULAR | Status: AC
  Filled 2019-12-26: qty 1

## 2019-12-26 MED ORDER — HYDRALAZINE HCL 20 MG/ML IJ SOLN: 10.0000 mg | INTRAMUSCULAR | Status: DC | PRN

## 2019-12-26 MED ORDER — ENOXAPARIN SODIUM 40 MG/0.4ML ~~LOC~~ SOLN
40.0000 mg | SUBCUTANEOUS | Status: DC
  Administered 2019-12-27 – 2019-12-28 (×2): 40 mg via SUBCUTANEOUS
  Filled 2019-12-26 (×2): qty 0.4

## 2019-12-26 MED ORDER — OXYCODONE HCL 5 MG PO TABS: 5.0000 mg | ORAL_TABLET | Freq: Once | ORAL | Status: DC | PRN

## 2019-12-26 MED ORDER — LIDOCAINE 2% (20 MG/ML) 5 ML SYRINGE
INTRAMUSCULAR | Status: AC
  Filled 2019-12-26: qty 5

## 2019-12-26 MED ORDER — ROCURONIUM BROMIDE 10 MG/ML (PF) SYRINGE
PREFILLED_SYRINGE | INTRAVENOUS | Status: DC | PRN
  Administered 2019-12-26: 40 mg via INTRAVENOUS

## 2019-12-26 MED ORDER — SUCCINYLCHOLINE CHLORIDE 200 MG/10ML IV SOSY
PREFILLED_SYRINGE | INTRAVENOUS | Status: AC
  Filled 2019-12-26: qty 10

## 2019-12-26 SURGICAL SUPPLY — 57 items
ADH SKN CLS APL DERMABOND .7 (GAUZE/BANDAGES/DRESSINGS) ×1
APL PRP STRL LF DISP 70% ISPRP (MISCELLANEOUS) ×1
BINDER ABDOMINAL 12 ML 46-62 (SOFTGOODS) IMPLANT
BLADE CLIPPER SURG (BLADE) IMPLANT
BLADE EXTENDED COATED 6.5IN (ELECTRODE) IMPLANT
CHLORAPREP W/TINT 26 (MISCELLANEOUS) ×3 IMPLANT
COVER WAND RF STERILE (DRAPES) IMPLANT
DECANTER SPIKE VIAL GLASS SM (MISCELLANEOUS) ×2 IMPLANT
DERMABOND ADVANCED (GAUZE/BANDAGES/DRESSINGS) ×2
DERMABOND ADVANCED .7 DNX12 (GAUZE/BANDAGES/DRESSINGS) ×1 IMPLANT
DRAIN CHANNEL 19F RND (DRAIN) IMPLANT
DRAPE INCISE IOBAN 66X45 STRL (DRAPES) IMPLANT
DRAPE LAPAROSCOPIC ABDOMINAL (DRAPES) ×3 IMPLANT
DRAPE WARM FLUID 44X44 (DRAPES) ×3 IMPLANT
ELECT REM PT RETURN 15FT ADLT (MISCELLANEOUS) ×3 IMPLANT
EVACUATOR SILICONE 100CC (DRAIN) IMPLANT
GAUZE SPONGE 4X4 12PLY STRL (GAUZE/BANDAGES/DRESSINGS) ×3 IMPLANT
GLOVE BIO SURGEON STRL SZ7.5 (GLOVE) ×3 IMPLANT
GOWN STRL REUS W/TWL LRG LVL3 (GOWN DISPOSABLE) ×5 IMPLANT
GOWN STRL REUS W/TWL XL LVL3 (GOWN DISPOSABLE) ×2 IMPLANT
GRASPER SUT TROCAR 14GX15 (MISCELLANEOUS) IMPLANT
HANDLE SUCTION POOLE (INSTRUMENTS) ×1 IMPLANT
KIT BASIN OR (CUSTOM PROCEDURE TRAY) ×3 IMPLANT
KIT TURNOVER KIT A (KITS) IMPLANT
MARKER SKIN DUAL TIP RULER LAB (MISCELLANEOUS) ×3 IMPLANT
NEEDLE HYPO 22GX1.5 SAFETY (NEEDLE) ×2 IMPLANT
NS IRRIG 1000ML POUR BTL (IV SOLUTION) ×6 IMPLANT
PACK GENERAL/GYN (CUSTOM PROCEDURE TRAY) ×3 IMPLANT
PENCIL SMOKE EVACUATOR (MISCELLANEOUS) ×3 IMPLANT
PROTECTOR NERVE ULNAR (MISCELLANEOUS) ×6 IMPLANT
SPECIMEN JAR LARGE (MISCELLANEOUS) IMPLANT
SPONGE LAP 18X18 RF (DISPOSABLE) ×8 IMPLANT
STAPLER VISISTAT 35W (STAPLE) ×3 IMPLANT
SUCTION POOLE HANDLE (INSTRUMENTS) ×3
SUT ETHILON 3 0 PS 1 (SUTURE) IMPLANT
SUT MNCRL AB 4-0 PS2 18 (SUTURE) IMPLANT
SUT NOVA NAB GS-21 0 18 T12 DT (SUTURE) ×4 IMPLANT
SUT PDS AB 0 CT1 36 (SUTURE) ×4 IMPLANT
SUT PDS AB 1 CTX 36 (SUTURE) IMPLANT
SUT PDS AB 1 TP1 96 (SUTURE) ×6 IMPLANT
SUT PDS AB 2-0 CT2 27 (SUTURE) ×2 IMPLANT
SUT SILK 2 0 (SUTURE) ×3
SUT SILK 2 0 SH CR/8 (SUTURE) ×3 IMPLANT
SUT SILK 2-0 30XBRD TIE 12 (SUTURE) ×1 IMPLANT
SUT SILK 3 0 (SUTURE)
SUT SILK 3 0 12 30 (SUTURE) ×3 IMPLANT
SUT SILK 3 0 SH CR/8 (SUTURE) ×3 IMPLANT
SUT SILK 3-0 18XBRD TIE 12 (SUTURE) IMPLANT
SUT VIC AB 2-0 CT1 27 (SUTURE)
SUT VIC AB 2-0 CT1 27XBRD (SUTURE) IMPLANT
SUT VIC AB 3-0 SH 18 (SUTURE) IMPLANT
SUT VIC AB 3-0 SH 27 (SUTURE)
SUT VIC AB 3-0 SH 27XBRD (SUTURE) IMPLANT
SYR 20ML LL LF (SYRINGE) ×3 IMPLANT
TOWEL OR 17X26 10 PK STRL BLUE (TOWEL DISPOSABLE) ×6 IMPLANT
TRAY FOLEY MTR SLVR 16FR STAT (SET/KITS/TRAYS/PACK) ×2 IMPLANT
YANKAUER SUCT BULB TIP NO VENT (SUCTIONS) ×2 IMPLANT

## 2019-12-26 NOTE — ED Notes (Signed)
Per Marlyne Beards hold off on NG tube at this time.

## 2019-12-26 NOTE — Anesthesia Postprocedure Evaluation (Signed)
Anesthesia Post Note  Patient: Pamela Malone  Procedure(s) Performed: EXPLORATORY LAPAROTOMY (N/A ) HERNIA REPAIR VENTRAL ADULT (N/A )     Patient location during evaluation: PACU Anesthesia Type: General Level of consciousness: awake and alert and oriented Pain management: pain level controlled Vital Signs Assessment: post-procedure vital signs reviewed and stable Respiratory status: spontaneous breathing, nonlabored ventilation, respiratory function stable and patient connected to nasal cannula oxygen Cardiovascular status: blood pressure returned to baseline and stable Postop Assessment: no apparent nausea or vomiting Anesthetic complications: no    Last Vitals:  Vitals:   12/26/19 1902 12/26/19 1915  BP: (!) 152/71 139/87  Pulse: 91 84  Resp: 12 17  Temp: 36.7 C   SpO2: 99% 98%    Last Pain:  Vitals:   12/26/19 1915  TempSrc:   PainSc: 0-No pain                 [name] A.

## 2019-12-26 NOTE — Op Note (Signed)
12/26/2019  6:51 PM  PATIENT:  Pamela Malone  69 y.o. female  PRE-OPERATIVE DIAGNOSIS:  VENTRAL HERNIA  POST-OPERATIVE DIAGNOSIS: Posterior wall incisional hernia  PROCEDURE:  Procedure(s): EXPLORATORY LAPAROTOMY (N/A) Primary HERNIA REPAIR VENTRAL ADULT (N/A)  SURGEON:  Surgeon(s) and Role:    Axel Filler, MD - Primary  ANESTHESIA:   local and general  EBL:  50 mL   BLOOD ADMINISTERED:none  DRAINS: none   LOCAL MEDICATIONS USED:  NONE  SPECIMEN:  No Specimen  DISPOSITION OF SPECIMEN:  N/A  COUNTS:  YES  TOURNIQUET:  * No tourniquets in log *  DICTATION: .Dragon Dictation Indication procedure: Patient is a 69 year old female who underwent open incisional hernia repair with mesh in November 2020.  Patient states that 1 week ago she had significant pain to the abdominal wall.  She states that today the pain was significantly worse.  Patient presented to the ER and was found to have a "internal hernia ."She states that she had significant pain.  Upon examination patient had signs of peritonitis.  Patient was taken back to the operating for exploratory laparotomy.  Findings: Patient had a left lower quadrant posterior wall disruption.  There was herniated bowel within the posterior mesh area sandwiched between the mesh and the posterior abdominal wall closure.  The bowel was viable.  The mesentery was somewhat congested however there was no ischemia to the bowel.  There was a small seroma within the retrorectus space in which the mesh was in.  The rest of the posterior wall was free of any defects.  The mesh appeared to be in place appropriately.  The mesh was cut initially to enter the retrorectus space.  The mesh was not removed.  This was primarily reapproximate with the anterior abdominal wall closure using #0 PDS sutures in a standard running fashion.  There was intermittent 0 Novafil's in a figure-of-eight fashion used to help strengthen the closure.  Details of  procedure: After the patient was consented she was taken back to the operating room placed in supine position with bilateral SCDs in place.  She underwent general trach anesthesia.  A NG-tube was placed.  Patient was prepped draped standard fashion.  Timeout is called all facts verified.  At this time an upper midline incision was made using a #10 blade.  Cautery was used to maintain hemostasis and dissection was taken down to the anterior fascia.  This was raised between 2 Kocher's.  The midline was then incised with cautery.  Once the mesh was visualized it could be seen that there was a large seroma that was serous.  There appeared to be no infection.  At this time I was able to incise the mesh.  The posterior mesh layer appeared to be free of any adhesions.  I then continue to open the incision inferiorly.  There was some bowel that could easily be seen.  This appeared to be viable and not ischemic.  Once the entire area of the posterior rectus sheath could be seen.  It appeared there was a hole in the posterior rectus sheath/posterior abdominal wall repair left lower quadrant.  This was approximately 4 x 4 cm in size.  The small bowel was herniated through this area.  The bowel was not ischemic.  The mesentery of the bowel was somewhat congested.  At this time I proceeded to reduce the small bowel contents back into the abdominal cavity.  The hernia defect was then reapproximated using a running 2-0 PDS.  The posterior rectus sheath was then irrigated out with sterile saline.  At this time the midline closure was done in a standard fashion using 0 PDS sutures x2 in a standard running fashion.  Intermittent 0 Novafil's were then used to help reapproximate the midline fascia.  The skin was then reapproximated using skin staples.  Patient taught the procedure well was taken to the recovery in stable condition.  PLAN OF CARE: Admit for overnight observation  PATIENT DISPOSITION:  PACU - hemodynamically  stable.   Delay start of Pharmacological VTE agent (>24hrs) due to surgical blood loss or risk of bleeding: not applicable

## 2019-12-26 NOTE — H&P (Addendum)
[facility] Admission Note  Pamela Malone 1951/02/14  [id number].    Requesting MD: Sherryl Mangesaitlyn Kendrick PA-C Chief Complaint: Abdominal pain with nausea and vomiting  Reason for Consult: Complex ventral hernia containing most of the jejunum, and apparent internal hernia   HPI:  Patient is a 69 year old female presented to the ED with the above-noted complaints. She says she lifted a case of wine and knew right away something was wrong.  She talked to the office and was given naprosyn.  She has been using it but with no real improvement in her pain symptoms.  She has had ongoing discomfort since it occurred. She has pain with moving around, and just sitting up right now.  She has been able to eat and having BM's normally, none today so far.  The pain has become worse over the last 24 hours, and she had some nausea and vomiting this AM.  She came to the ED for evaluation.   She underwent laparoscopic right hemicolectomy with stapled ileocolic anastomosis on 03/30/2018 by Dr. Marin Olphristopher White for a colon mass.  Pathology was found to be benign.  She was seen again on 08/18/2019 by Dr. Karie SodaSteven Gross and underwent in incisional incarcerated abdominal wall hernia repair.  Patient underwent a diagnostic laparoscopy, a component separation bilateral transverse abdominis release x2, incisional ventral wall hernia repair with mesh, partial panniculectomy.  Patient presented to the ED today reported that she picked up a case of wine and had abdominal pain where her hernia repair was.  She developed nausea and emesis x2 this a.m. and came to the emergency department.  Work-up in the ED: She is afebrile, blood pressure is elevated, CMP shows glucose of 168, all the remaining values are normal, potassium 4.2 creatinine 0.86.  WBC 11.8, hemoglobin 14.7, hematocrit 44.2, platelets 318,000.   CT scan: Shows a complex ventral hernia which contains most of the jejunum.  There are loops of jejunum which appear  mildly twisted with wall thickening.  There is diffuse mesenteric thickening within this apparent internal hernia.  There is extensive ascites within the large hernia which is present anteriorly to the mid lower abdominal level.  There was no free air or portal venous air.  No frank obstruction.  Postoperative change of the right colon with anastomosis patent.  The terminal ileum appears normal.  She has cholelithiasis, severe spinal stenosis at L4-L5 due to disc protrusion, bony hypertrophy and scoliosis.  She also has a small hiatal hernia.    We are asked to see.   ROS: Review of Systems  Constitutional: Negative.   HENT: Negative.   Eyes: Negative.   Respiratory: Negative.   Cardiovascular: Negative.   Gastrointestinal: Positive for abdominal pain, nausea and vomiting. Negative for blood in stool, constipation, diarrhea and melena.  Genitourinary: Negative.   Musculoskeletal: Negative.   Skin: Negative.   Neurological: Negative.   Endo/Heme/Allergies: Negative.   Psychiatric/Behavioral: The patient is nervous/anxious.     Family History  Problem Relation Age of Onset  . Hypertension Mother   . Stroke Mother   . Alzheimer's disease Mother   . Diabetes Paternal Grandmother   . Parkinsonism Paternal Grandmother   . Breast cancer Neg Hx     Past Medical History:  Diagnosis Date  . Cataract    immature  . Complication of anesthesia   . Depression    many years ago  . DJD (degenerative joint disease)   . GERD (gastroesophageal reflux disease)   . History of  hiatal hernia   . History of right bundle branch block (RBBB)    released after cardiac evaluation by Dr Donnie Aho 12/2014; gave surgical clearance  . Hyperlipidemia   . Hypertension   . Low back pain   . Menopause   . Nocturia   . PONV (postoperative nausea and vomiting)    [place]  . Psoriasis   . Scoliosis     Past Surgical History:  Procedure Laterality Date  . ANKLE SURGERY Right    from a previous injury as  a child   . CARPAL TUNNEL RELEASE  bilateral  . DILATION AND CURETTAGE OF UTERUS  2009  . LAPAROSCOPIC ASSISTED VENTRAL HERNIA REPAIR N/A 08/18/2019   Procedure: LAPAROSCOPIC VENTRAL WALL HERNIA REPAIR WITH MESH AND LYSIS OF ADHESIONS, COMPONENT SEPARATION TIMES TWO, TAR;  Surgeon: Karie Soda, MD;  Location: [facility];  Service: General;  Laterality: N/A;  . LAPAROSCOPIC RIGHT HEMI COLECTOMY Right 03/30/2018   Procedure: LAPAROSCOPIC VS OPEN RIGHT HEMI COLECTOMY;  Surgeon: Andria Meuse, MD;  Location: [facility];  Service: General;  Laterality: Right;  . Left Thumb Joint Replacement    . TOTAL KNEE ARTHROPLASTY Left 01/28/2015   Procedure: TOTAL KNEE ARTHROPLASTY;  Surgeon: Dannielle Huh, MD;  Location: [facility] OR;  Service: Orthopedics;  Laterality: Left;    Social History:  reports that she has never smoked. She has never used smokeless tobacco. She reports current alcohol use. She reports that she does not use drugs. Tobacco:  None Etoh:  Wine 2-3 glasses per night Drugs:  None She is here with husband    Allergies: No Known Allergies   Celexa at home also Prior to Admission medications   Medication Sig Start Date End Date Taking? Authorizing Provider  acetaminophen (TYLENOL) 500 MG tablet Take 1,000 mg by mouth daily as needed for moderate pain or headache.   Yes [provider]  Biotin 5000 MCG CAPS Take 5,000 mcg by mouth every evening.   Yes [provider]  cetirizine (ZYRTEC) 10 MG tablet Take 10 mg by mouth daily as needed for allergies.    Yes [provider]  Cholecalciferol (VITAMIN D) 2000 units tablet Take 4,000 Units by mouth daily.   Yes [provider]  citalopram (CELEXA) 20 MG tablet Take 20 mg by mouth daily.  02/12/16  Yes [provider]  clobetasol ointment (TEMOVATE) 0.05 % Apply 1 application topically daily as needed (psoriasis).  02/13/16  Yes [provider]  Halobetasol Prop-Tazarotene (DUOBRII) 0.01-0.045 % LOTN  Apply 1 application topically daily as needed (psoriasis).   Yes [provider]  Multiple Vitamin (MULTIVITAMIN WITH MINERALS) TABS tablet Take 1 tablet by mouth daily.    Yes [provider]  naproxen (NAPROSYN) 500 MG tablet Take 500 mg by mouth 2 (two) times daily. 12/20/19  Yes [provider]  Olmesartan-Amlodipine-HCTZ (TRIBENZOR) 40-5-25 MG TABS Take 1 tablet by mouth daily. 05/24/14  Yes Schoenhoff, Harrington Challenger, MD  omeprazole (PRILOSEC) 20 MG capsule TAKE 1 CAPSULE DAILY Patient taking differently: Take 20 mg by mouth daily.  02/12/14  Yes Schoenhoff, Harrington Challenger, MD  Polyethyl Glycol-Propyl Glycol (SYSTANE OP) Place 1 drop into both eyes 2 (two) times daily as needed (dry eyes).    Yes [provider]  Probiotic Product (PHILLIPS COLON HEALTH) CAPS Take 1 capsule by mouth at bedtime.   Yes [provider]  PROTOPIC 0.1 % ointment Apply 1 application topically daily as needed (psoriasis).  11/05/11  Yes [provider]  psyllium (REGULOID) 0.52 g capsule Take 0.52-1.04 g by mouth See admin instructions. Take 1.04 g in the morning and 0.52 g at night   Yes [provider]  simvastatin (ZOCOR) 20 MG tablet TAKE 1 TABLET AT BEDTIME Patient taking differently: Take 20 mg by mouth every evening.  05/24/14  Yes Schoenhoff, Harrington Challenger, MD  sodium chloride (OCEAN) 0.65 % SOLN nasal spray Place 1 spray into both nostrils as needed for congestion.   Yes [provider]  methocarbamol (ROBAXIN) 750 MG tablet Take 1 tablet (750 mg total) by mouth 4 (four) times daily as needed (use for muscle cramps/pain). Patient not taking: Reported on 12/26/2019 08/18/19   Karie Soda, MD  oxyCODONE (OXY IR/ROXICODONE) 5 MG immediate release tablet Take 1-2 tablets (5-10 mg total) by mouth every 6 (six) hours as needed for moderate pain, severe pain or breakthrough pain. Patient not taking: Reported on 12/26/2019 08/18/19   Karie Soda, MD  colesevelam  [facility]) 625 MG tablet Take 3,750 mg by mouth daily.    12/14/11  [provider]     Blood pressure (!) 147/78, pulse 65, temperature 98 F (36.7 C), temperature source Oral, resp. rate 16, height 5\' 1"  (1.549 m), weight 81.6 kg, SpO2 99 %. Physical Exam:  General: pleasant, WD, WN white female who is laying in bed in NAD, she is uncomfortable especially with movement. HEENT: head is normocephalic, atraumatic.  Sclera are noninjected.  Pupils are equal.  Ears and nose without any masses or lesions.  Mouth is pink and moist Heart: regular, rate, and rhythm.  Normal s1,s2. No obvious murmurs, gallops, or rubs noted.  Palpable radial and pedal pulses bilaterally Lungs: CTAB, no wheezes, rhonchi, or rales noted.  Respiratory effort nonlabored Abd: soft, she is tender all over, ND, no BS, no masses, hernias, or organomegaly.  No acute peritonitis. MS: all 4 extremities are symmetrical with no cyanosis, clubbing, or edema. Skin: warm and dry with no masses, lesions, or rashes Neuro: Cranial nerves 2-12 grossly intact, sensation is normal throughout Psych: A&Ox3 with an appropriate affect.   Results for orders placed or performed during the hospital encounter of 12/26/19 (from the past 48 hour(s))  Lipase, blood     Status: None   Collection Time: 12/26/19 11:53 AM  Result Value Ref Range   Lipase 25 11 - 51 U/L    Comment: Performed at [facility], [address], [place], [place]  Comprehensive metabolic panel     Status: Abnormal   Collection Time: 12/26/19 11:53 AM  Result Value Ref Range   Sodium 137 135 - 145 mmol/L   Potassium 4.2 3.5 - 5.1 mmol/L   Chloride 99 98 - 111 mmol/L   CO2 27 22 - 32 mmol/L   Glucose, Bld 168 (H) 70 - 99 mg/dL    Comment: Glucose reference range applies only to samples taken after fasting for at least 8 hours.   BUN 19 8 - 23 mg/dL   Creatinine, Ser 12/28/19 0.44 - 1.00 mg/dL   Calcium 9.7 8.9 - 2.20 mg/dL   Total  Protein 7.0 6.5 - 8.1 g/dL   Albumin 4.4 3.5 - 5.0 g/dL   AST 23 15 - 41 U/L   ALT 19 0 - 44 U/L   Alkaline Phosphatase 61 38 - 126 U/L   Total Bilirubin 0.9 0.3 - 1.2 mg/dL   GFR calc non Af Amer >60 >60 mL/min   GFR calc Af Amer >  60 >60 mL/min   Anion gap 11 5 - 15    Comment: Performed at [facility], [place] [address] [place], [place] [address]  CBC     Status: Abnormal   Collection Time: 12/26/19 11:53 AM  Result Value Ref Range   WBC 11.8 (H) 4.0 - 10.5 K/uL   RBC 4.74 3.87 - 5.11 MIL/uL   Hemoglobin 14.7 12.0 - 15.0 g/dL   HCT 44.2 36.0 - 46.0 %   MCV 93.2 80.0 - 100.0 fL   MCH 31.0 26.0 - 34.0 pg   MCHC 33.3 30.0 - 36.0 g/dL   RDW 13.8 11.5 - 15.5 %   Platelets 318 150 - 400 K/uL   nRBC 0.0 0.0 - 0.2 %    Comment: Performed at [facility], [place] [address] [place], [place] [address]   CT Abdomen Pelvis W Contrast  Result Date: 12/26/2019 CLINICAL DATA:  Abdominal pain with neutropenia EXAM: CT ABDOMEN AND PELVIS WITH CONTRAST TECHNIQUE: Multidetector CT imaging of the abdomen and pelvis was performed using the standard protocol following bolus administration of intravenous contrast. CONTRAST:  169mL OMNIPAQUE IOHEXOL 300 MG/ML  SOLN COMPARISON:  May 25, 2019 FINDINGS: Lower chest: Lung bases are clear.  Small hiatal hernia. Hepatobiliary: There is a 3 mm probable cyst in the anterior segment right lobe of the liver. There is a cyst in the posterior segment of the right lobe of the liver measuring 1.0 x 0.7 cm. No other focal liver lesions are evident. There is evidence of fatty infiltration near the fissure for the ligamentum teres. There are gallstones within the gallbladder. There is no gallbladder wall thickening or pericholecystic fluid evident by CT. No biliary duct dilatation. Pancreas: No pancreatic mass or inflammatory focus. Spleen: No splenic lesions are evident. Adrenals/Urinary Tract: Adrenals bilaterally appear normal. There is a  cyst arising from the lateral mid left kidney measuring 1.3 x 1.2 cm. There is no evident hydronephrosis on either side. There is no renal or ureteral calculus on either side. Urinary bladder is midline with wall thickness within normal limits. Stomach/Bowel: Patient has had a previous right hemicolectomy with anastomosis patent. Moderate stool is seen in the colon. Most of the jejunum resides within a hernia which is located to the left of midline at the level of the superior aspect of the left iliac crest. This apparent internal hernia has a neck measuring 4.1 cm from right to left dimension, best seen on axial slice 43 series 2. There is thickening of multiple loops of jejunum in this hernia with associated moderate ascites localized into this sizable hernia. The mesentery is thickened within this hernia, and there are multiple loops of dilated jejunum which suggests that there is a twisting type phenomenon occurring within this apparent internal hernia. These loops of bowel are located to the left of midline with ascites throughout this apparent hernia, both the left and right. The more distal loops of small bowel are located outside of this internal hernia; the terminal ileum appears normal. There is no frank bowel obstruction evident. There is no free air or portal venous air. Vascular/Lymphatic: There is aortic atherosclerosis. No abdominal aortic aneurysm evident. Major venous structures appear patent. No evident adenopathy in the abdomen or pelvis. Reproductive: Uterus is anteverted.  No evident pelvic mass. Other: No periappendiceal region inflammation. Appendix absent. No abscess evident. There is no ascites at outside of the apparent internal hernia. Musculoskeletal: There is upper lumbar levoscoliosis. There is extensive degenerative change  throughout the lumbar spine. No blastic or lytic bone lesions are evident. There is spinal stenosis at L4-5 due to disc protrusion and bony hypertrophy, exacerbated  by the scoliosis. No intramuscular lesions are evident. IMPRESSION: 1. Complex appearing internal hernia which contains most of the jejunum. There are loops of jejunum which appear mildly twisted with wall thickening. There is diffuse mesenteric thickening within this apparent internal hernia. There is extensive ascites within this large internal hernia which is present anteriorly at the mid to lower abdominal level. This appearance of much of the jejunum suggests at risk bowel; surgical consultation advised. No frank obstruction, seen however. No free air or portal venous air. 2. Postoperative change right colon with anastomosis patent. Terminal ileum appears normal. 3.  Cholelithiasis. 4. Severe spinal stenosis at L4-5 due to disc protrusion, bony hypertrophy, and scoliosis. 5.  Aortic Atherosclerosis (ICD10-I70.0). 6.  Small hiatal hernia. Electronically Signed   By: Bretta Bang III M.D.   On: 12/26/2019 13:30      Assessment/Plan Hypertension Hyperlipidemia   Complex appearing ventral hernia with twisted appearance of an internal hernia Hx laparoscopic right hemicolectomy with stapled ileiocolic anastamosis 04/19/2018, Dr. Marin Olp Hx diagnostic laparoscopy, component separation, bilateral transverse abdominis release x2, incisional ventral hernia repair with mesh, partial panniculectomy, 08/18/2019 Dr. Karie Soda  Plan: Admit to inpatient, n.p.o., IV hydration, preop antibiotics We will keep patient n.p.o.  Place an NG if she has any further nausea or vomiting, place a Foley if she has increased discomfort standing up to void.  I think she will require exploratory laparotomy for repair of incarcerated ventral hernia later today.  Covid study, lactate are both pending.  Sherrie George [facility] 12/26/2019, 1:58 PM Please see Amion for pager number during day hours 7:00am-4:30pm

## 2019-12-26 NOTE — Anesthesia Preprocedure Evaluation (Signed)
Anesthesia Evaluation  Patient identified by MRN, date of birth, ID band Patient awake    Reviewed: Allergy & Precautions, NPO status , Patient's Chart, lab work & pertinent test results, reviewed documented beta blocker date and time   History of Anesthesia Complications (+) PONV and history of anesthetic complications  Airway Mallampati: I  TM Distance: >3 FB Neck ROM: Full   Comment: Hypertrophied tonsils Dental no notable dental hx. (+) Teeth Intact, Caps   Pulmonary neg pulmonary ROS,  Snores   Pulmonary exam normal breath sounds clear to auscultation       Cardiovascular hypertension, Pt. on medications Normal cardiovascular exam Rhythm:Regular Rate:Normal  EKG- NSR, RBBB pattern   Neuro/Psych PSYCHIATRIC DISORDERS Depression negative neurological ROS     GI/Hepatic Neg liver ROS, hiatal hernia, GERD  Medicated and Controlled,Hx/o colon Ca S/P hemicolectomy Incarcerated Incisional hernia   Endo/Other  Hyperlipidemia  Renal/GU negative Renal ROS  negative genitourinary   Musculoskeletal  (+) Arthritis , Osteoarthritis,  Psoriasis Scoliosis  Low back pain Ventral hernia    Abdominal (+) + obese,  Abdomen: tender.    Peds  Hematology negative hematology ROS (+)   Anesthesia Other Findings   Reproductive/Obstetrics HPV                             Anesthesia Physical  Anesthesia Plan  ASA: III  Anesthesia Plan: General   Post-op Pain Management:    Induction: Intravenous  PONV Risk Score and Plan: 4 or greater and Ondansetron, Dexamethasone, Treatment may vary due to age or medical condition and Scopolamine patch - Pre-op  Airway Management Planned: Oral ETT  Additional Equipment:   Intra-op Plan:   Post-operative Plan: Extubation in OR  Informed Consent: I have reviewed the patients History and Physical, chart, labs and discussed the procedure including the  risks, benefits and alternatives for the proposed anesthesia with the patient or authorized representative who has indicated his/her understanding and acceptance.     Dental advisory given  Plan Discussed with: CRNA and Surgeon  Anesthesia Plan Comments:         Anesthesia Quick Evaluation

## 2019-12-26 NOTE — ED Provider Notes (Signed)
I saw and evaluated the patient, reviewed the resident's note and I agree with the findings and plan.  EKG:   69 year old female presents with abdominal discomfort.  Has evidence of strangulated bowel.  General surgery to see   Lorre Nick, MD 12/26/19 1421

## 2019-12-26 NOTE — Anesthesia Procedure Notes (Signed)
Procedure Name: Intubation Date/Time: 12/26/2019 5:30 PM Performed by: Keldrick Pomplun D, CRNA Pre-anesthesia Checklist: Patient identified, Emergency Drugs available, Suction available and Patient being monitored Patient Re-evaluated:Patient Re-evaluated prior to induction Oxygen Delivery Method: Circle system utilized Preoxygenation: Pre-oxygenation with 100% oxygen Induction Type: IV induction, Rapid sequence and Cricoid Pressure applied Laryngoscope Size: Mac and 4 Grade View: Grade I Tube type: Oral Tube size: 7.5 mm Number of attempts: 1 Airway Equipment and Method: Stylet Placement Confirmation: ETT inserted through vocal cords under direct vision,  positive ETCO2 and breath sounds checked- equal and bilateral Secured at: 21 cm Tube secured with: Tape Dental Injury: Teeth and Oropharynx as per pre-operative assessment

## 2019-12-26 NOTE — ED Provider Notes (Signed)
[facility]-EMERGENCY DEPT Provider Note   CSN: [id number] Arrival date & time: 12/26/19  1101     History Chief Complaint  Patient presents with  . Abdominal Pain  . Emesis    Pamela Malone is a 69 y.o. female.  HPI      69 year old female presents with a 1 week history of abdominal pain.  Patient has a history of a ventral hernia which was repaired in November 2020 by Dr. Michaell Cowing.  She states that a week ago she was lifting a box of wine when she had sudden onset of pain along her incision.  She states that pain has been gradually worsening over the last week.  However this morning it significantly worsened with 2 episodes of emesis.  She denies any fevers, chills, diarrhea, constipation   Past Medical History:  Diagnosis Date  . Cataract    immature  . Complication of anesthesia   . Depression    many years ago  . DJD (degenerative joint disease)   . GERD (gastroesophageal reflux disease)   . History of hiatal hernia   . History of right bundle branch block (RBBB)    released after cardiac evaluation by Dr Donnie Aho 12/2014; gave surgical clearance  . Hyperlipidemia   . Hypertension   . Low back pain   . Menopause   . Nocturia   . PONV (postoperative nausea and vomiting)    [place]  . Psoriasis   . Scoliosis     Patient Active Problem List   Diagnosis Date Noted  . Incisional hernia 08/18/2019  . Obesity (BMI 30-39.9) 08/18/2019  . 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 Primary osteoarthritis of left knee 01/25/2015  . History of hyperglycemia 02/20/2013  . 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 Shingles 02/23/2012  . 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 DJD (degenerative joint disease)   . Scoliosis   . Low back pain   . Depression   . GERD (gastroesophageal reflux disease)   . Hyperlipidemia   . Hypertension   . Menopause   . Psoriasis     Past Surgical  History:  Procedure Laterality Date  . ANKLE SURGERY Right    from a previous injury as a child   . CARPAL TUNNEL RELEASE  bilateral  . DILATION AND CURETTAGE OF UTERUS  2009  . LAPAROSCOPIC ASSISTED VENTRAL HERNIA REPAIR N/A 08/18/2019   Procedure: LAPAROSCOPIC VENTRAL WALL HERNIA REPAIR WITH MESH AND LYSIS OF ADHESIONS, COMPONENT SEPARATION TIMES TWO, TAR;  Surgeon: Karie Soda, MD;  Location: [facility];  Service: General;  Laterality: N/A;  . LAPAROSCOPIC RIGHT HEMI COLECTOMY Right 03/30/2018   Procedure: LAPAROSCOPIC VS OPEN RIGHT HEMI COLECTOMY;  Surgeon: Andria Meuse, MD;  Location: [facility];  Service: General;  Laterality: Right;  . Left Thumb Joint Replacement    . TOTAL KNEE ARTHROPLASTY Left 01/28/2015   Procedure: TOTAL KNEE ARTHROPLASTY;  Surgeon: Dannielle Huh, MD;  Location: [facility] OR;  Service: Orthopedics;  Laterality: Left;     OB History    Gravida  3   Para  2   Term      Preterm      AB  1   Living  2     SAB      TAB      Ectopic      Multiple      Live Births  Family History  Problem Relation Age of Onset  . Hypertension Mother   . Stroke Mother   . Alzheimer's disease Mother   . Diabetes Paternal Grandmother   . Parkinsonism Paternal Grandmother   . Breast cancer Neg Hx     Social History   Tobacco Use  . Smoking status: Never Smoker  . Smokeless tobacco: Never Used  Substance Use Topics  . Alcohol use: Yes    Comment: Couple of glasses of wine at night  . Drug use: No    Home Medications Prior to Admission medications   Medication Sig Start Date End Date Taking? Authorizing Provider  acetaminophen (TYLENOL) 500 MG tablet Take 1,000 mg by mouth daily as needed for moderate pain or headache.   Yes [provider]  Biotin 5000 MCG CAPS Take 5,000 mcg by mouth every evening.   Yes [provider]  cetirizine (ZYRTEC) 10 MG tablet Take 10 mg by mouth daily as needed for allergies.    Yes [provider]  Cholecalciferol (VITAMIN D) 2000 units tablet Take 4,000 Units by mouth daily.   Yes [provider]  citalopram (CELEXA) 20 MG tablet Take 20 mg by mouth daily.  02/12/16  Yes [provider]  clobetasol ointment (TEMOVATE) 1.61 % Apply 1 application topically daily as needed (psoriasis).  02/13/16  Yes [provider]  Halobetasol Prop-Tazarotene (DUOBRII) 0.01-0.045 % LOTN Apply 1 application topically daily as needed (psoriasis).   Yes [provider]  Multiple Vitamin (MULTIVITAMIN WITH MINERALS) TABS tablet Take 1 tablet by mouth daily.    Yes [provider]  naproxen (NAPROSYN) 500 MG tablet Take 500 mg by mouth 2 (two) times daily. 12/20/19  Yes [provider]  Olmesartan-Amlodipine-HCTZ (TRIBENZOR) 40-5-25 MG TABS Take 1 tablet by mouth daily. 05/24/14  Yes Schoenhoff, Altamese Cabal, MD  omeprazole (PRILOSEC) 20 MG capsule TAKE 1 CAPSULE DAILY Patient taking differently: Take 20 mg by mouth daily.  02/12/14  Yes Schoenhoff, Altamese Cabal, MD  Polyethyl Glycol-Propyl Glycol (SYSTANE OP) Place 1 drop into both eyes 2 (two) times daily as needed (dry eyes).    Yes [provider]  Probiotic Product ([place]) CAPS Take 1 capsule by mouth at bedtime.   Yes [provider]  PROTOPIC 0.1 % ointment Apply 1 application topically daily as needed (psoriasis).  11/05/11  Yes [provider]  psyllium (REGULOID) 0.52 g capsule Take 0.52-1.04 g by mouth See admin instructions. Take 1.04 g in the morning and 0.52 g at night   Yes [provider]  simvastatin (ZOCOR) 20 MG tablet TAKE 1 TABLET AT BEDTIME Patient taking differently: Take 20 mg by mouth every evening.  05/24/14  Yes Schoenhoff, Altamese Cabal, MD  sodium chloride (OCEAN) 0.65 % SOLN nasal spray Place 1 spray into both nostrils as needed for congestion.   Yes [provider]  methocarbamol (ROBAXIN) 750 MG tablet Take 1 tablet (750  mg total) by mouth 4 (four) times daily as needed (use for muscle cramps/pain). Patient not taking: Reported on 12/26/2019 08/18/19   Michael Boston, MD  oxyCODONE (OXY IR/ROXICODONE) 5 MG immediate release tablet Take 1-2 tablets (5-10 mg total) by mouth every 6 (six) hours as needed for moderate pain, severe pain or breakthrough pain. Patient not taking: Reported on 12/26/2019 08/18/19   Michael Boston, MD  colesevelam [facility]) 625 MG tablet Take 3,750 mg by mouth daily.    12/14/11  [provider]    Allergies  Patient has no known allergies.  Review of Systems   Review of Systems  Constitutional: Negative for chills and fever.  HENT: Negative for rhinorrhea and sore throat.   Eyes: Negative for visual disturbance.  Respiratory: Negative for cough and shortness of breath.   Cardiovascular: Negative for chest pain and leg swelling.  Gastrointestinal: Positive for abdominal pain, nausea and vomiting. Negative for diarrhea.  Genitourinary: Negative for dysuria, frequency and urgency.  Musculoskeletal: Negative for joint swelling and neck pain.  Skin: Negative for rash and wound.  Neurological: Negative for syncope and numbness.  All other systems reviewed and are negative.   Physical Exam Updated Vital Signs BP (!) 147/78   Pulse 65   Temp 98 F (36.7 C) (Oral)   Resp 16   Ht 5\' 1"  (1.549 m)   Wt 81.6 kg   SpO2 99%   BMI 34.01 kg/m   Physical Exam Vitals and nursing note reviewed.  Constitutional:      Appearance: She is well-developed.  HENT:     Head: Normocephalic and atraumatic.  Eyes:     Conjunctiva/sclera: Conjunctivae normal.  Cardiovascular:     Rate and Rhythm: Normal rate and regular rhythm.     Heart sounds: Normal heart sounds. No murmur.  Pulmonary:     Effort: Pulmonary effort is normal. No respiratory distress.     Breath sounds: Normal breath sounds. No wheezing or rales.  Abdominal:     General: Bowel sounds are normal. There is no  distension.     Palpations: Abdomen is soft.     Tenderness: There is generalized abdominal tenderness.     Hernia: There is no hernia in the ventral area.     Comments: Incision extending from the xiphoid process to the pubic symphysis  Musculoskeletal:        General: No tenderness or deformity. Normal range of motion.     Cervical back: Neck supple.  Skin:    General: Skin is warm and dry.     Findings: No erythema or rash.  Neurological:     Mental Status: She is alert and oriented to person, place, and time.  Psychiatric:        Behavior: Behavior normal.     ED Results / Procedures / Treatments   Labs (all labs ordered are listed, but only abnormal results are displayed) Labs Reviewed  COMPREHENSIVE METABOLIC PANEL - Abnormal; Notable for the following components:      Result Value   Glucose, Bld 168 (*)    All other components within normal limits  CBC - Abnormal; Notable for the following components:   WBC 11.8 (*)    All other components within normal limits  RESPIRATORY PANEL BY RT PCR (FLU A&B, COVID)  LIPASE, BLOOD  URINALYSIS, ROUTINE W REFLEX MICROSCOPIC  LACTIC ACID, PLASMA    EKG None  Radiology CT Abdomen Pelvis W Contrast  Result Date: 12/26/2019 CLINICAL DATA:  Abdominal pain with neutropenia EXAM: CT ABDOMEN AND PELVIS WITH CONTRAST TECHNIQUE: Multidetector CT imaging of the abdomen and pelvis was performed using the standard protocol following bolus administration of intravenous contrast. CONTRAST:  12/28/2019 OMNIPAQUE IOHEXOL 300 MG/ML  SOLN COMPARISON:  May 25, 2019 FINDINGS: Lower chest: Lung bases are clear.  Small hiatal hernia. Hepatobiliary: There is a 3 mm probable cyst in the anterior segment right lobe of the liver. There is a cyst in the posterior segment of the right lobe of the liver measuring 1.0 x 0.7 cm. No  other focal liver lesions are evident. There is evidence of fatty infiltration near the fissure for the ligamentum teres. There are  gallstones within the gallbladder. There is no gallbladder wall thickening or pericholecystic fluid evident by CT. No biliary duct dilatation. Pancreas: No pancreatic mass or inflammatory focus. Spleen: No splenic lesions are evident. Adrenals/Urinary Tract: Adrenals bilaterally appear normal. There is a cyst arising from the lateral mid left kidney measuring 1.3 x 1.2 cm. There is no evident hydronephrosis on either side. There is no renal or ureteral calculus on either side. Urinary bladder is midline with wall thickness within normal limits. Stomach/Bowel: Patient has had a previous right hemicolectomy with anastomosis patent. Moderate stool is seen in the colon. Most of the jejunum resides within a hernia which is located to the left of midline at the level of the superior aspect of the left iliac crest. This apparent internal hernia has a neck measuring 4.1 cm from right to left dimension, best seen on axial slice 43 series 2. There is thickening of multiple loops of jejunum in this hernia with associated moderate ascites localized into this sizable hernia. The mesentery is thickened within this hernia, and there are multiple loops of dilated jejunum which suggests that there is a twisting type phenomenon occurring within this apparent internal hernia. These loops of bowel are located to the left of midline with ascites throughout this apparent hernia, both the left and right. The more distal loops of small bowel are located outside of this internal hernia; the terminal ileum appears normal. There is no frank bowel obstruction evident. There is no free air or portal venous air. Vascular/Lymphatic: There is aortic atherosclerosis. No abdominal aortic aneurysm evident. Major venous structures appear patent. No evident adenopathy in the abdomen or pelvis. Reproductive: Uterus is anteverted.  No evident pelvic mass. Other: No periappendiceal region inflammation. Appendix absent. No abscess evident. There is no  ascites at outside of the apparent internal hernia. Musculoskeletal: There is upper lumbar levoscoliosis. There is extensive degenerative change throughout the lumbar spine. No blastic or lytic bone lesions are evident. There is spinal stenosis at L4-5 due to disc protrusion and bony hypertrophy, exacerbated by the scoliosis. No intramuscular lesions are evident. IMPRESSION: 1. Complex appearing internal hernia which contains most of the jejunum. There are loops of jejunum which appear mildly twisted with wall thickening. There is diffuse mesenteric thickening within this apparent internal hernia. There is extensive ascites within this large internal hernia which is present anteriorly at the mid to lower abdominal level. This appearance of much of the jejunum suggests at risk bowel; surgical consultation advised. No frank obstruction, seen however. No free air or portal venous air. 2. Postoperative change right colon with anastomosis patent. Terminal ileum appears normal. 3.  Cholelithiasis. 4. Severe spinal stenosis at L4-5 due to disc protrusion, bony hypertrophy, and scoliosis. 5.  Aortic Atherosclerosis (ICD10-I70.0). 6.  Small hiatal hernia. Electronically Signed   By: Bretta BangWilliam  Woodruff III M.D.   On: 12/26/2019 13:30    Procedures Procedures (including critical care time)  Medications Ordered in ED Medications  fentaNYL (SUBLIMAZE) injection 50 mcg (has no administration in time range)  sodium chloride flush (NS) 0.9 % injection 3 mL (3 mLs Intravenous Given 12/26/19 1159)  morphine 4 MG/ML injection 4 mg (4 mg Intravenous Given 12/26/19 1154)  ondansetron (ZOFRAN) injection 4 mg (4 mg Intravenous Given 12/26/19 1154)  iohexol (OMNIPAQUE) 300 MG/ML solution 100 mL (100 mLs Intravenous Contrast Given 12/26/19 1311)  ED Course  I have reviewed the triage vital signs and the nursing notes.  Pertinent labs & imaging results that were available during my care of the patient were reviewed by me and  considered in my medical decision making (see chart for details).    MDM Rules/Calculators/A&P                       Patient presents with abdominal pain, nausea and vomiting.  She is diffusely tender.  Palpable hernia.  Blood work unremarkable.  CT scan shows any containing most of the jejunum with associated twisting and wall thickening.  Discussed with general surgery who recommended NG tube and COVID-19 swab.  Discussed with patient who is agreeable with plan.  Will give additional pain medicine.    AILEENA IGLESIA was evaluated in Emergency Department on 12/26/2019 for the symptoms described in the history of present illness. She was evaluated in the context of the global COVID-19 pandemic, which necessitated consideration that the patient might be at risk for infection with the SARS-CoV-2 virus that causes COVID-19. Institutional protocols and algorithms that pertain to the evaluation of patients at risk for COVID-19 are in a state of rapid change based on information released by regulatory bodies including the CDC and federal and state organizations. These policies and algorithms were followed during the patient's care in the ED.         Final Clinical Impression(s) / ED Diagnoses Final diagnoses:  None    Rx / DC Orders ED Discharge Orders    None       Rueben Bash 12/26/19 2113    Lorre Nick, MD 12/28/19 1034

## 2019-12-26 NOTE — ED Notes (Signed)
Pt transported to CT ?

## 2019-12-26 NOTE — Transfer of Care (Signed)
Immediate Anesthesia Transfer of Care Note  Patient: Pamela Malone  Procedure(s) Performed: EXPLORATORY LAPAROTOMY (N/A ) HERNIA REPAIR VENTRAL ADULT (N/A )  Patient Location: PACU  Anesthesia Type:General  Level of Consciousness: awake, alert  and oriented  Airway & Oxygen Therapy: Patient Spontanous Breathing and Patient connected to face mask oxygen  Post-op Assessment: Report given to RN and Post -op Vital signs reviewed and stable  Post vital signs: Reviewed and stable  Last Vitals:  Vitals Value Taken Time  BP 152/71 12/26/19 1903  Temp    Pulse 91 12/26/19 1904  Resp 17 12/26/19 1904  SpO2 100 % 12/26/19 1904  Vitals shown include unvalidated device data.  Last Pain:  Vitals:   12/26/19 1714  TempSrc:   PainSc: 5       Patients Stated Pain Goal: 3 (12/26/19 1652)  Complications: No apparent anesthesia complications

## 2019-12-26 NOTE — ED Notes (Signed)
Husband at bedside.  

## 2019-12-26 NOTE — ED Triage Notes (Signed)
Patient states she had a hernia repair on 11/20. Patient reports that she picked up a case of wine and has had pain where the hernia repair was. Patient also c/o emesis x 2 this AM.

## 2019-12-27 LAB — CBC
HCT: 39.3 % (ref 36.0–46.0)
Hemoglobin: 13 g/dL (ref 12.0–15.0)
MCH: 30.7 pg (ref 26.0–34.0)
MCHC: 33.1 g/dL (ref 30.0–36.0)
MCV: 92.9 fL (ref 80.0–100.0)
Platelets: 310 10*3/uL (ref 150–400)
RBC: 4.23 MIL/uL (ref 3.87–5.11)
RDW: 14.1 % (ref 11.5–15.5)
WBC: 10.2 10*3/uL (ref 4.0–10.5)
nRBC: 0 % (ref 0.0–0.2)

## 2019-12-27 LAB — BASIC METABOLIC PANEL
Anion gap: 8 (ref 5–15)
BUN: 15 mg/dL (ref 8–23)
CO2: 26 mmol/L (ref 22–32)
Calcium: 8.9 mg/dL (ref 8.9–10.3)
Chloride: 99 mmol/L (ref 98–111)
Creatinine, Ser: 0.71 mg/dL (ref 0.44–1.00)
GFR calc Af Amer: >60 mL/min (ref >60)
GFR calc non Af Amer: >60 mL/min (ref >60)
Glucose, Bld: 145 mg/dL — ABNORMAL HIGH (ref 70–99)
Potassium: 4.6 mmol/L (ref 3.5–5.1)
Sodium: 133 mmol/L — ABNORMAL LOW (ref 135–145)

## 2019-12-27 MED ORDER — HYDROCHLOROTHIAZIDE 25 MG PO TABS
25.0000 mg | ORAL_TABLET | Freq: Every day | ORAL | Status: DC
  Administered 2019-12-27 – 2019-12-28 (×2): 25 mg via ORAL
  Filled 2019-12-27 (×2): qty 1

## 2019-12-27 MED ORDER — AMLODIPINE BESYLATE 5 MG PO TABS
5.0000 mg | ORAL_TABLET | Freq: Every day | ORAL | Status: DC
  Administered 2019-12-27 – 2019-12-28 (×2): 5 mg via ORAL
  Filled 2019-12-27 (×2): qty 1

## 2019-12-27 MED ORDER — PSYLLIUM 0.52 G PO CAPS: 0.5200 g | ORAL_CAPSULE | ORAL | Status: DC

## 2019-12-27 MED ORDER — ARTIFICIAL TEARS OPHTHALMIC OINT
TOPICAL_OINTMENT | Freq: Two times a day (BID) | OPHTHALMIC | Status: DC | PRN
  Filled 2019-12-27: qty 3.5

## 2019-12-27 MED ORDER — OLMESARTAN-AMLODIPINE-HCTZ 40-5-25 MG PO TABS: 1.0000 | ORAL_TABLET | Freq: Every day | ORAL | Status: DC

## 2019-12-27 MED ORDER — MENTHOL 3 MG MT LOZG
1.0000 | LOZENGE | OROMUCOSAL | Status: DC | PRN
  Administered 2019-12-27: 3 mg via ORAL

## 2019-12-27 MED ORDER — IRBESARTAN 150 MG PO TABS
300.0000 mg | ORAL_TABLET | Freq: Every day | ORAL | Status: DC
  Administered 2019-12-27 – 2019-12-28 (×2): 300 mg via ORAL
  Filled 2019-12-27 (×3): qty 2

## 2019-12-27 MED ORDER — CITALOPRAM HYDROBROMIDE 20 MG PO TABS
20.0000 mg | ORAL_TABLET | Freq: Every day | ORAL | Status: DC
  Administered 2019-12-27 – 2019-12-28 (×2): 20 mg via ORAL
  Filled 2019-12-27 (×2): qty 1

## 2019-12-27 MED ORDER — PSYLLIUM 95 % PO PACK
1.0000 | PACK | Freq: Every day | ORAL | Status: DC
  Administered 2019-12-27 – 2019-12-28 (×2): 1 via ORAL
  Filled 2019-12-27 (×3): qty 1

## 2019-12-27 NOTE — Progress Notes (Signed)
1 Day Post-Op    CC: Abdominal pain with nausea and vomiting  Subjective: Patient sitting up in bed and really wants her NG out.  She says she has had some flatus.   Objective: Vital signs in last 24 hours: Temp:  [97.7 F (36.5 C)-98.9 F (37.2 C)] 98.9 F (37.2 C) (03/31 0912) Pulse Rate:  [65-92] 77 (03/31 0912) Resp:  [12-20] 18 (03/31 0912) BP: (124-176)/(65-99) 124/70 (03/31 0912) SpO2:  [95 %-100 %] 96 % (03/31 0912) Weight:  [81.6 kg] 81.6 kg (03/30 1652) Last BM Date: 12/25/19 2050 IV 2225 urine Nothing recorded for the NG but there is about 500 in the canister No BM Afebrile vital signs are stable. Glucose 145, sodium 133, potassium 4.6, creatinine 0.71 WBC 10.2, H/H 13/39.3, platelets 310,000.    Intake/Output from previous day: 03/30 0701 - 03/31 0700 In: 2050 [I.V.:2050] Out: 2275 [Urine:2225; Blood:50] Intake/Output this shift: Total I/O In: 0  Out: 700 [Urine:400; Emesis/NG output:300]  General appearance: alert, cooperative and no distress Resp: clear to auscultation bilaterally GI: Soft, sore, midline incision looks okay.  She says she has had a little flatus bowel sounds are hypoactive.  Lab Results:  Recent Labs    12/26/19 1153 12/27/19 0425  WBC 11.8* 10.2  HGB 14.7 13.0  HCT 44.2 39.3  PLT 318 310    BMET Recent Labs    12/26/19 1153 12/27/19 0425  NA 137 133*  K 4.2 4.6  CL 99 99  CO2 27 26  GLUCOSE 168* 145*  BUN 19 15  CREATININE 0.86 0.71  CALCIUM 9.7 8.9   PT/INR Recent Labs    12/26/19 1618  LABPROT 12.5  INR 0.9    Recent Labs  Lab 12/26/19 1153  AST 23  ALT 19  ALKPHOS 61  BILITOT 0.9  PROT 7.0  ALBUMIN 4.4     Lipase     Component Value Date/Time   LIPASE 25 12/26/2019 1153     Medications: . enoxaparin (LOVENOX) injection  40 mg Subcutaneous Q24H  . pantoprazole (PROTONIX) IV  40 mg Intravenous QHS   . dextrose 5 % and 0.9% NaCl 100 mL/hr at 12/26/19 2103  . piperacillin-tazobactam  (ZOSYN)  IV 3.375 g (12/27/19 0859)    Assessment/Plan Hypertension Hyperlipidemia  Posterior wall incisional hernia with incarceration Oratory laparotomy, primary ventral hernia repair, 12/26/2019, Dr. Axel Filler POD #1   FEN: IV fluids/NG/n.p.o. ID: [email or web address] preop; Zosyn 3/30 >> DVT: Lovenox Follow-up: Dr. Derrell Lolling  Plan: She really wants the NG out so I will do some clamping and give her clear liquids and see how she does.  Mobilize, she does well hopefully we can pull the drain later today.  Restart more of her home medicines.  LOS: 1 day    Saray Capasso 12/27/2019 Please see Amion

## 2019-12-28 NOTE — Discharge Instructions (Signed)
[facility]      [facility] [phone]  OPEN ABDOMINAL SURGERY: POST OP INSTRUCTIONS  Always review your discharge instruction sheet given to you by the facility where your surgery was performed.  IF YOU HAVE DISABILITY OR FAMILY LEAVE FORMS, YOU MUST BRING THEM TO THE OFFICE FOR PROCESSING.  PLEASE DO NOT GIVE THEM TO YOUR DOCTOR.  1. A prescription for pain medication may be given to you upon discharge.  Take your pain medication as prescribed, if needed.  If narcotic pain medicine is not needed, then you may take acetaminophen (Tylenol) or ibuprofen (Advil) as needed. 2. Take your usually prescribed medications unless otherwise directed. 3. If you need a refill on your pain medication, please contact your pharmacy. They will contact our office to request authorization.  Prescriptions will not be filled after 5pm or on week-ends. 4. You should follow a light diet the first few days after arrival home, such as soup and crackers, pudding, etc.unless your doctor has advised otherwise. A high-fiber, low fat diet can be resumed as tolerated.   Be sure to include lots of fluids daily. Most patients will experience some swelling and bruising on the chest and neck area.  Ice packs will help.  Swelling and bruising can take several days to resolve 5. Most patients will experience some swelling and bruising in the area of the incision. Ice pack will help. Swelling and bruising can take several days to resolve..  6. It is common to experience some constipation if taking pain medication after surgery.  Increasing fluid intake and taking a stool softener will usually help or prevent this problem from occurring.  A mild laxative (Milk of Magnesia or Miralax) should be taken according to package directions if there are no bowel movements after 48 hours. 7.  You may have steri-strips (small skin tapes) in place directly over the incision.  These strips should be left on the skin for 7-10 days.  If your  surgeon used skin glue on the incision, you may shower in 24 hours.  The glue will flake off over the next 2-3 weeks.  Any sutures or staples will be removed at the office during your follow-up visit. You may find that a light gauze bandage over your incision may keep your staples from being rubbed or pulled. You may shower and replace the bandage daily. 8. ACTIVITIES:  You may resume regular (light) daily activities beginning the next day--such as daily self-care, walking, climbing stairs--gradually increasing activities as tolerated.  You may have sexual intercourse when it is comfortable.  Refrain from any heavy lifting or straining until approved by your doctor. a. You may drive when you no longer are taking prescription pain medication, you can comfortably wear a seatbelt, and you can safely maneuver your car and apply brakes b. Return to Work: ___________________________________ 9. You should see your doctor in the office for a follow-up appointment approximately two weeks after your surgery.  Make sure that you call for this appointment within a day or two after you arrive home to insure a convenient appointment time. OTHER INSTRUCTIONS:  _____________________________________________________________ _____________________________________________________________  WHEN TO CALL YOUR DOCTOR: 1. Fever over 101.0 2. Inability to urinate 3. Nausea and/or vomiting 4. Extreme swelling or bruising 5. Continued bleeding from incision. 6. Increased pain, redness, or drainage from the incision. 7. Difficulty swallowing or breathing 8. Muscle cramping or spasms. 9. Numbness or tingling in hands or feet or around lips.  The clinic staff is available to   answer your questions during regular business hours.  Please don't hesitate to call and ask to speak to one of the nurses if you have concerns.  For further questions, please visit [email or web address]   

## 2019-12-28 NOTE — Progress Notes (Signed)
D/C instructions given to patient. Patient had no questions. NT or writer will wheel patient out once her husband gets the car

## 2019-12-28 NOTE — Progress Notes (Signed)
2 Days Post-Op    CC: Abdominal pain with nausea and vomiting  Subjective: Tolerating clears. Having flatus. Denies nausea or vomiting. Pain controlled without medications. Mobilizing in hallway. Eager for discharge home.    Objective: Vital signs in last 24 hours: Temp:  [98.5 F (36.9 C)] 98.5 F (36.9 C) (04/01 0538) Pulse Rate:  [70-74] 74 (04/01 0538) Resp:  [16] 16 (04/01 0538) BP: (101-138)/(71-96) 134/71 (04/01 0538) SpO2:  [96 %-98 %] 97 % (04/01 0538) Last BM Date: 12/25/19 Intake/Output from previous day: 03/31 0701 - 04/01 0700 In: 2251.6 [P.O.:570; I.V.:1681.6] Out: 2400 [Urine:2100; Emesis/NG output:300] Intake/Output this shift: Total I/O In: 540 [P.O.:240; I.V.:300] Out: -   General appearance: alert, cooperative and no distress Resp: clear to auscultation bilaterally GI: Soft, appropriately tenderness, mild distention, incision with honeycomb in place and some dried sanguinous drainage on bandage. Underlying skin looks non-erythematous.  Lab Results:  Recent Labs    12/26/19 1153 12/27/19 0425  WBC 11.8* 10.2  HGB 14.7 13.0  HCT 44.2 39.3  PLT 318 310    BMET Recent Labs    12/26/19 1153 12/27/19 0425  NA 137 133*  K 4.2 4.6  CL 99 99  CO2 27 26  GLUCOSE 168* 145*  BUN 19 15  CREATININE 0.86 0.71  CALCIUM 9.7 8.9   PT/INR Recent Labs    12/26/19 1618  LABPROT 12.5  INR 0.9    Recent Labs  Lab 12/26/19 1153  AST 23  ALT 19  ALKPHOS 61  BILITOT 0.9  PROT 7.0  ALBUMIN 4.4     Lipase     Component Value Date/Time   LIPASE 25 12/26/2019 1153     Medications: . irbesartan  300 mg Oral Daily   And  . amLODipine  5 mg Oral Daily   And  . hydrochlorothiazide  25 mg Oral Daily  . citalopram  20 mg Oral Daily  . enoxaparin (LOVENOX) injection  40 mg Subcutaneous Q24H  . pantoprazole (PROTONIX) IV  40 mg Intravenous QHS  . psyllium  1 packet Oral Daily   . dextrose 5 % and 0.9% NaCl 100 mL/hr at 12/27/19 2133  .  piperacillin-tazobactam (ZOSYN)  IV 3.375 g (12/28/19 6384)    Assessment/Plan Hypertension Hyperlipidemia   Posterior wall incisional hernia with incarceration S/P Exploratory laparotomy, primary ventral hernia repair, 12/26/2019, Dr. Axel Filler - POD#2 - NGT removed yesterday - advance diet and saline lock IV   FEN: SOFT ID: [email or web address] preop; Zosyn 3/30 >> D/C zosyn  DVT: Lovenox Follow-up: Dr. Derrell Lolling  Plan: possible afternoon discharge if tolerating solids    LOS: 2 days    Adam Phenix 12/28/2019  Please see Amion

## 2020-01-09 NOTE — Discharge Summary (Signed)
[facility] Discharge Summary   Patient ID: Pamela Malone MRN: [id number] DOB/AGE: 68/25/1952 68 y.o.  Admit date: 12/26/2019 Discharge date: 01/09/2020  Discharge Diagnosis Patient Active Problem List   Diagnosis Date Noted  . Incarcerated ventral hernia 12/26/2019  . Incisional hernia 08/18/2019  . Obesity (BMI 30-39.9) 08/18/2019  . 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 Primary osteoarthritis of left knee 01/25/2015  . History of hyperglycemia 02/20/2013  . 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 Shingles 02/23/2012  . 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 DJD (degenerative joint disease)   . Scoliosis   . Low back pain   . Depression   . GERD (gastroesophageal reflux disease)   . Hyperlipidemia   . Hypertension   . Menopause   . Psoriasis     Consultants None   Procedures Dr. Axel Filler -S/P Exploratory laparotomy, primary ventral hernia repair, 12/26/2019  HPI: Patient is a 69 year old female presented to the ED with abdominal pain, nausea, vomiting. She says she lifted a case of wine and knew right away something was wrong.  She talked to the office and was given naprosyn.  She has been using it but with no real improvement in her pain symptoms.  She has had ongoing discomfort since it occurred. She has pain with moving around, and just sitting up right now.  She has been able to eat and having BM's normally, none today so far.  The pain has become worse over the last 24 hours, and she had some nausea and vomiting this AM.  She came to the ED for evaluation.   She underwent laparoscopic right hemicolectomy with stapled ileocolic anastomosis on 03/30/2018 by Dr. Marin Olp for a colon mass.  Pathology was found to be benign.  She was seen again on 08/18/2019 by Dr. Karie Soda and underwent in incisional incarcerated abdominal wall hernia repair.  Patient underwent a diagnostic  laparoscopy, a component separation bilateral transverse abdominis release x2, incisional ventral wall hernia repair with mesh, partial panniculectomy.  Patient presented to the ED today reported that she picked up a case of wine and had abdominal pain where her hernia repair was.  She developed nausea and emesis x2 this a.m. and came to the emergency department.  Work-up in the ED: She is afebrile, blood pressure is elevated, CMP shows glucose of 168, all the remaining values are normal, potassium 4.2 creatinine 0.86.  WBC 11.8, hemoglobin 14.7, hematocrit 44.2, platelets 318,000.   CT scan: Shows a complex ventral hernia which contains most of the jejunum.  There are loops of jejunum which appear mildly twisted with wall thickening.  There is diffuse mesenteric thickening within this apparent internal hernia.  There is extensive ascites within the large hernia which is present anteriorly to the mid lower abdominal level.  There was no free air or portal venous air.  No frank obstruction.  Postoperative change of the right colon with anastomosis patent.  The terminal ileum appears normal.  She has cholelithiasis, severe spinal stenosis at L4-L5 due to disc protrusion, bony hypertrophy and scoliosis.  She also has a small hiatal hernia.    Hospital Course:  Patient was admitted and taken emergently to the operating room for the above procedure where a a defect was found in her posterior rectus sheath LUQ with viable bowel herniated through the area. Bowel was reduced and no resection was required. The patient was admitted to the floor  with an NG tube in place. NG was removed on POD#1. Diet was advanced as tolerated. On POD#2 vitals were stable, pain controlled, having flatus, tolerating PO, and felt medically stable for discharge home. She will require follow up as below.   Allergies as of 12/28/2019   No Known Allergies     Medication List    STOP taking these medications   methocarbamol 750 MG  tablet Commonly known as: ROBAXIN     TAKE these medications   acetaminophen 500 MG tablet Commonly known as: TYLENOL Take 1,000 mg by mouth daily as needed for moderate pain or headache.   Biotin 5000 MCG Caps Take 5,000 mcg by mouth every evening.   cetirizine 10 MG tablet Commonly known as: ZYRTEC Take 10 mg by mouth daily as needed for allergies.   citalopram 20 MG tablet Commonly known as: CELEXA Take 20 mg by mouth daily.   clobetasol ointment 0.05 % Commonly known as: TEMOVATE Apply 1 application topically daily as needed (psoriasis).   Duobrii 0.01-0.045 % Lotn Generic drug: Halobetasol Prop-Tazarotene Apply 1 application topically daily as needed (psoriasis).   multivitamin with minerals Tabs tablet Take 1 tablet by mouth daily.   naproxen 500 MG tablet Commonly known as: NAPROSYN Take 500 mg by mouth 2 (two) times daily.   Olmesartan-amLODIPine-HCTZ 40-5-25 MG Tabs Commonly known as: Tribenzor Take 1 tablet by mouth daily.   omeprazole 20 MG capsule Commonly known as: PRILOSEC TAKE 1 CAPSULE DAILY   oxyCODONE 5 MG immediate release tablet Commonly known as: Oxy IR/ROXICODONE Take 1-2 tablets (5-10 mg total) by mouth every 6 (six) hours as needed for moderate pain, severe pain or breakthrough pain.   [facility] Colon Health Caps Take 1 capsule by mouth at bedtime.   Protopic 0.1 % ointment Generic drug: tacrolimus Apply 1 application topically daily as needed (psoriasis).   psyllium 0.52 g capsule Commonly known as: REGULOID Take 0.52-1.04 g by mouth See admin instructions. Take 1.04 g in the morning and 0.52 g at night   simvastatin 20 MG tablet Commonly known as: ZOCOR TAKE 1 TABLET AT BEDTIME What changed: when to take this   sodium chloride 0.65 % Soln nasal spray Commonly known as: OCEAN Place 1 spray into both nostrils as needed for congestion.   SYSTANE OP Place 1 drop into both eyes 2 (two) times daily as needed (dry eyes).    Vitamin D 50 MCG (2000 UT) tablet Take 4,000 Units by mouth daily.        Follow-up Information    Ralene Ok, MD Follow up.   Specialty: General Surgery Why: Our office is scheduling you for a nurses visit for staple removal and follow up visit with Dr. Rosendo Gros, please call to confirm appointment date/time. Contact information: [place] [address] [phone]           Signed: Obie Dredge, [facility] 01/09/2020, 3:03 PM

## 2020-05-14 ENCOUNTER — Other Ambulatory Visit: Payer: Self-pay | Admitting: Family Medicine

## 2020-05-14 DIAGNOSIS — Z1231 Encounter for screening mammogram for malignant neoplasm of breast: Secondary | ICD-10-CM

## 2020-05-28 ENCOUNTER — Ambulatory Visit
Admission: RE | Admit: 2020-05-28 | Discharge: 2020-05-28 | Disposition: A | Discharge status: Home / Self Care | Payer: 59 | Source: Ambulatory Visit

## 2020-05-28 ENCOUNTER — Other Ambulatory Visit: Payer: Self-pay

## 2020-05-28 DIAGNOSIS — Z1231 Encounter for screening mammogram for malignant neoplasm of breast: Secondary | ICD-10-CM

## 2020-11-26 ENCOUNTER — Other Ambulatory Visit: Payer: Self-pay | Admitting: Family Medicine

## 2020-11-26 DIAGNOSIS — E2839 Other primary ovarian failure: Secondary | ICD-10-CM

## 2020-12-06 ENCOUNTER — Other Ambulatory Visit: Payer: Self-pay | Admitting: Family Medicine

## 2020-12-06 DIAGNOSIS — Z1231 Encounter for screening mammogram for malignant neoplasm of breast: Secondary | ICD-10-CM

## 2021-05-29 ENCOUNTER — Ambulatory Visit
Admission: RE | Admit: 2021-05-29 | Discharge: 2021-05-29 | Disposition: A | Discharge status: Home / Self Care | Payer: 59 | Source: Ambulatory Visit | Attending: Family Medicine | Admitting: Family Medicine

## 2021-05-29 ENCOUNTER — Other Ambulatory Visit: Payer: Self-pay

## 2021-05-29 DIAGNOSIS — E2839 Other primary ovarian failure: Secondary | ICD-10-CM

## 2021-05-29 DIAGNOSIS — Z1231 Encounter for screening mammogram for malignant neoplasm of breast: Secondary | ICD-10-CM

## 2021-12-29 ENCOUNTER — Other Ambulatory Visit: Payer: Self-pay | Admitting: Rehabilitation

## 2021-12-29 DIAGNOSIS — M48062 Spinal stenosis, lumbar region with neurogenic claudication: Secondary | ICD-10-CM

## 2022-01-03 ENCOUNTER — Ambulatory Visit
Admission: RE | Admit: 2022-01-03 | Discharge: 2022-01-03 | Disposition: A | Discharge status: Home / Self Care | Payer: 59 | Source: Ambulatory Visit | Attending: Rehabilitation | Admitting: Rehabilitation

## 2022-01-03 DIAGNOSIS — M48062 Spinal stenosis, lumbar region with neurogenic claudication: Secondary | ICD-10-CM

## 2022-06-29 ENCOUNTER — Other Ambulatory Visit: Payer: Self-pay | Admitting: Family Medicine

## 2022-06-29 DIAGNOSIS — Z1231 Encounter for screening mammogram for malignant neoplasm of breast: Secondary | ICD-10-CM

## 2022-07-21 ENCOUNTER — Ambulatory Visit
Admission: RE | Admit: 2022-07-21 | Discharge: 2022-07-21 | Disposition: A | Discharge status: Home / Self Care | Payer: 59 | Source: Ambulatory Visit

## 2022-07-21 DIAGNOSIS — Z1231 Encounter for screening mammogram for malignant neoplasm of breast: Secondary | ICD-10-CM

## 2023-01-14 ENCOUNTER — Other Ambulatory Visit (HOSPITAL_COMMUNITY): Payer: Self-pay | Admitting: Family Medicine

## 2023-01-14 DIAGNOSIS — R0602 Shortness of breath: Secondary | ICD-10-CM

## 2023-02-15 ENCOUNTER — Ambulatory Visit (HOSPITAL_COMMUNITY)
Admission: RE | Admit: 2023-02-15 | Discharge: 2023-02-15 | Disposition: A | Discharge status: Home / Self Care | Payer: 59 | Source: Ambulatory Visit | Attending: Family Medicine | Admitting: Family Medicine

## 2023-02-15 DIAGNOSIS — I4510 Unspecified right bundle-branch block: Secondary | ICD-10-CM | POA: Diagnosis not present

## 2023-02-15 DIAGNOSIS — K219 Gastro-esophageal reflux disease without esophagitis: Secondary | ICD-10-CM | POA: Insufficient documentation

## 2023-02-15 DIAGNOSIS — E785 Hyperlipidemia, unspecified: Secondary | ICD-10-CM | POA: Insufficient documentation

## 2023-02-15 DIAGNOSIS — I10 Essential (primary) hypertension: Secondary | ICD-10-CM | POA: Insufficient documentation

## 2023-02-15 DIAGNOSIS — R0602 Shortness of breath: Secondary | ICD-10-CM | POA: Diagnosis present

## 2023-02-15 LAB — ECHOCARDIOGRAM COMPLETE
Area-P 1/2: 3.65 cm2
Calc EF: 54.4 %
S' Lateral: 2.9 cm
Single Plane A2C EF: 53.4 %
Single Plane A4C EF: 53.4 %

## 2023-02-15 NOTE — Progress Notes (Signed)
Echocardiogram 2D Echocardiogram has been performed.  Augustine Radar 02/15/2023, 11:48 AM

## 2023-07-14 ENCOUNTER — Other Ambulatory Visit: Payer: Self-pay | Admitting: Family Medicine

## 2023-07-14 DIAGNOSIS — Z1231 Encounter for screening mammogram for malignant neoplasm of breast: Secondary | ICD-10-CM

## 2023-08-05 ENCOUNTER — Ambulatory Visit
Admission: RE | Admit: 2023-08-05 | Discharge: 2023-08-05 | Disposition: A | Discharge status: Home / Self Care | Payer: 59 | Source: Ambulatory Visit

## 2023-08-05 DIAGNOSIS — Z1231 Encounter for screening mammogram for malignant neoplasm of breast: Secondary | ICD-10-CM

## 2024-08-08 ENCOUNTER — Ambulatory Visit: Admitting: Cardiology
